# Patient Record
Sex: Female | Born: 1935
Health system: Southern US, Community
[De-identification: ages and names within clinical notes are randomized; demographics above are authoritative.]

## PROBLEM LIST (undated history)

## (undated) DIAGNOSIS — E878 Other disorders of electrolyte and fluid balance, not elsewhere classified: Secondary | ICD-10-CM

## (undated) DIAGNOSIS — E669 Obesity, unspecified: Secondary | ICD-10-CM

## (undated) DIAGNOSIS — K219 Gastro-esophageal reflux disease without esophagitis: Secondary | ICD-10-CM

## (undated) DIAGNOSIS — D329 Benign neoplasm of meninges, unspecified: Secondary | ICD-10-CM

## (undated) DIAGNOSIS — K900 Celiac disease: Secondary | ICD-10-CM

## (undated) DIAGNOSIS — I6529 Occlusion and stenosis of unspecified carotid artery: Secondary | ICD-10-CM

## (undated) DIAGNOSIS — R269 Unspecified abnormalities of gait and mobility: Secondary | ICD-10-CM

## (undated) DIAGNOSIS — E785 Hyperlipidemia, unspecified: Secondary | ICD-10-CM

## (undated) DIAGNOSIS — I10 Essential (primary) hypertension: Secondary | ICD-10-CM

## (undated) DIAGNOSIS — I2510 Atherosclerotic heart disease of native coronary artery without angina pectoris: Secondary | ICD-10-CM

## (undated) DIAGNOSIS — K5792 Diverticulitis of intestine, part unspecified, without perforation or abscess without bleeding: Secondary | ICD-10-CM

## (undated) DIAGNOSIS — L719 Rosacea, unspecified: Secondary | ICD-10-CM

## (undated) HISTORY — PX: CARDIAC CATHETERIZATION: SHX172

## (undated) HISTORY — DX: Hyperlipidemia, unspecified: E78.5

## (undated) HISTORY — DX: Celiac disease: K90.0

## (undated) HISTORY — DX: Diverticulitis of intestine, part unspecified, without perforation or abscess without bleeding: K57.92

## (undated) HISTORY — DX: Occlusion and stenosis of unspecified carotid artery: I65.29

## (undated) HISTORY — PX: REPLACEMENT TOTAL KNEE: SUR1224

## (undated) HISTORY — PX: ABDOMINAL HYSTERECTOMY: SHX81

## (undated) HISTORY — PX: BUNIONECTOMY: SHX129

## (undated) HISTORY — DX: Benign neoplasm of meninges, unspecified: D32.9

## (undated) HISTORY — DX: Unspecified abnormalities of gait and mobility: R26.9

## (undated) HISTORY — DX: Obesity, unspecified: E66.9

## (undated) HISTORY — PX: TONSILLECTOMY: SUR1361

## (undated) HISTORY — DX: Gastro-esophageal reflux disease without esophagitis: K21.9

---

## 1991-06-19 HISTORY — PX: BRAIN TUMOR EXCISION: SHX577

## 1998-03-22 ENCOUNTER — Other Ambulatory Visit: Admission: RE | Admit: 1998-03-22 | Discharge: 1998-03-22 | Payer: Self-pay | Admitting: Obstetrics and Gynecology

## 1998-07-06 ENCOUNTER — Ambulatory Visit (HOSPITAL_COMMUNITY): Admission: RE | Admit: 1998-07-06 | Discharge: 1998-07-06 | Payer: Self-pay | Admitting: Surgery

## 1998-07-06 ENCOUNTER — Encounter: Payer: Self-pay | Admitting: Surgery

## 1999-02-07 ENCOUNTER — Encounter: Payer: Self-pay | Admitting: *Deleted

## 1999-02-07 ENCOUNTER — Ambulatory Visit (HOSPITAL_COMMUNITY): Admission: RE | Admit: 1999-02-07 | Discharge: 1999-02-07 | Payer: Self-pay | Admitting: *Deleted

## 1999-07-17 ENCOUNTER — Encounter (INDEPENDENT_AMBULATORY_CARE_PROVIDER_SITE_OTHER): Payer: Self-pay | Admitting: *Deleted

## 1999-07-17 ENCOUNTER — Inpatient Hospital Stay (HOSPITAL_COMMUNITY): Admission: EM | Admit: 1999-07-17 | Discharge: 1999-07-25 | Payer: Self-pay | Admitting: Emergency Medicine

## 1999-07-17 ENCOUNTER — Encounter: Payer: Self-pay | Admitting: Internal Medicine

## 1999-07-27 ENCOUNTER — Ambulatory Visit (HOSPITAL_COMMUNITY): Admission: RE | Admit: 1999-07-27 | Discharge: 1999-07-27 | Payer: Self-pay | Admitting: Surgery

## 1999-07-27 ENCOUNTER — Encounter: Payer: Self-pay | Admitting: Surgery

## 1999-08-08 ENCOUNTER — Ambulatory Visit (HOSPITAL_COMMUNITY): Admission: RE | Admit: 1999-08-08 | Discharge: 1999-08-08 | Payer: Self-pay | Admitting: Surgery

## 1999-08-08 ENCOUNTER — Encounter: Payer: Self-pay | Admitting: Surgery

## 1999-08-22 ENCOUNTER — Encounter: Payer: Self-pay | Admitting: Surgery

## 1999-08-22 ENCOUNTER — Encounter (INDEPENDENT_AMBULATORY_CARE_PROVIDER_SITE_OTHER): Payer: Self-pay

## 1999-08-22 ENCOUNTER — Ambulatory Visit (HOSPITAL_COMMUNITY): Admission: RE | Admit: 1999-08-22 | Discharge: 1999-08-22 | Payer: Self-pay | Admitting: Surgery

## 1999-09-14 ENCOUNTER — Ambulatory Visit (HOSPITAL_COMMUNITY): Admission: RE | Admit: 1999-09-14 | Discharge: 1999-09-14 | Payer: Self-pay | Admitting: *Deleted

## 2000-02-27 ENCOUNTER — Encounter: Payer: Self-pay | Admitting: Surgery

## 2000-02-27 ENCOUNTER — Encounter: Admission: RE | Admit: 2000-02-27 | Discharge: 2000-02-27 | Payer: Self-pay | Admitting: Surgery

## 2000-06-18 HISTORY — PX: CORONARY ARTERY BYPASS GRAFT: SHX141

## 2000-07-30 ENCOUNTER — Ambulatory Visit (HOSPITAL_COMMUNITY): Admission: RE | Admit: 2000-07-30 | Discharge: 2000-07-30 | Payer: Self-pay | Admitting: *Deleted

## 2000-07-30 ENCOUNTER — Encounter: Payer: Self-pay | Admitting: *Deleted

## 2000-07-31 ENCOUNTER — Encounter: Payer: Self-pay | Admitting: *Deleted

## 2000-07-31 ENCOUNTER — Encounter: Admission: RE | Admit: 2000-07-31 | Discharge: 2000-07-31 | Payer: Self-pay | Admitting: *Deleted

## 2000-11-07 ENCOUNTER — Inpatient Hospital Stay (HOSPITAL_COMMUNITY): Admission: EM | Admit: 2000-11-07 | Discharge: 2000-11-18 | Payer: Self-pay

## 2000-11-07 ENCOUNTER — Encounter: Payer: Self-pay | Admitting: Family Medicine

## 2000-11-10 ENCOUNTER — Encounter: Payer: Self-pay | Admitting: Surgery

## 2000-11-11 ENCOUNTER — Encounter: Payer: Self-pay | Admitting: Surgery

## 2000-11-12 ENCOUNTER — Encounter: Payer: Self-pay | Admitting: Surgery

## 2000-11-13 ENCOUNTER — Encounter: Payer: Self-pay | Admitting: Surgery

## 2000-11-18 ENCOUNTER — Encounter: Payer: Self-pay | Admitting: Cardiothoracic Surgery

## 2000-12-17 ENCOUNTER — Encounter (HOSPITAL_COMMUNITY): Admission: RE | Admit: 2000-12-17 | Discharge: 2001-03-17 | Payer: Self-pay | Admitting: Cardiology

## 2001-01-29 ENCOUNTER — Encounter: Payer: Self-pay | Admitting: Gastroenterology

## 2001-01-29 ENCOUNTER — Ambulatory Visit (HOSPITAL_COMMUNITY): Admission: RE | Admit: 2001-01-29 | Discharge: 2001-01-29 | Payer: Self-pay | Admitting: Gastroenterology

## 2001-02-25 ENCOUNTER — Ambulatory Visit (HOSPITAL_COMMUNITY): Admission: RE | Admit: 2001-02-25 | Discharge: 2001-02-25 | Payer: Self-pay | Admitting: Gastroenterology

## 2001-02-25 ENCOUNTER — Encounter (INDEPENDENT_AMBULATORY_CARE_PROVIDER_SITE_OTHER): Payer: Self-pay | Admitting: *Deleted

## 2001-04-07 ENCOUNTER — Encounter: Admission: RE | Admit: 2001-04-07 | Discharge: 2001-07-06 | Payer: Self-pay | Admitting: Gastroenterology

## 2001-05-23 ENCOUNTER — Ambulatory Visit (HOSPITAL_COMMUNITY): Admission: RE | Admit: 2001-05-23 | Discharge: 2001-05-23 | Payer: Self-pay | Admitting: Gastroenterology

## 2001-08-01 ENCOUNTER — Encounter: Payer: Self-pay | Admitting: *Deleted

## 2001-08-01 ENCOUNTER — Ambulatory Visit (HOSPITAL_COMMUNITY): Admission: RE | Admit: 2001-08-01 | Discharge: 2001-08-01 | Payer: Self-pay | Admitting: *Deleted

## 2001-10-08 ENCOUNTER — Encounter: Admission: RE | Admit: 2001-10-08 | Discharge: 2002-01-06 | Payer: Self-pay | Admitting: *Deleted

## 2002-01-19 ENCOUNTER — Encounter: Admission: RE | Admit: 2002-01-19 | Discharge: 2002-01-19 | Payer: Self-pay | Admitting: *Deleted

## 2002-01-19 ENCOUNTER — Encounter: Payer: Self-pay | Admitting: *Deleted

## 2002-08-04 ENCOUNTER — Encounter: Admission: RE | Admit: 2002-08-04 | Discharge: 2002-08-04 | Payer: Self-pay | Admitting: *Deleted

## 2002-08-04 ENCOUNTER — Encounter: Payer: Self-pay | Admitting: *Deleted

## 2002-09-03 ENCOUNTER — Encounter: Admission: RE | Admit: 2002-09-03 | Discharge: 2002-09-15 | Payer: Self-pay | Admitting: *Deleted

## 2003-08-10 ENCOUNTER — Encounter: Admission: RE | Admit: 2003-08-10 | Discharge: 2003-08-10 | Payer: Self-pay | Admitting: *Deleted

## 2003-08-13 ENCOUNTER — Emergency Department (HOSPITAL_COMMUNITY): Admission: EM | Admit: 2003-08-13 | Discharge: 2003-08-13 | Payer: Self-pay | Admitting: Emergency Medicine

## 2003-09-08 ENCOUNTER — Encounter: Admission: RE | Admit: 2003-09-08 | Discharge: 2003-10-06 | Payer: Self-pay | Admitting: Family Medicine

## 2003-10-06 ENCOUNTER — Encounter: Admission: RE | Admit: 2003-10-06 | Discharge: 2004-01-04 | Payer: Self-pay | Admitting: *Deleted

## 2003-12-07 ENCOUNTER — Ambulatory Visit (HOSPITAL_COMMUNITY): Admission: RE | Admit: 2003-12-07 | Discharge: 2003-12-07 | Payer: Self-pay | Admitting: *Deleted

## 2003-12-28 ENCOUNTER — Encounter: Admission: RE | Admit: 2003-12-28 | Discharge: 2004-01-25 | Payer: Self-pay | Admitting: *Deleted

## 2004-03-24 ENCOUNTER — Encounter: Admission: RE | Admit: 2004-03-24 | Discharge: 2004-03-24 | Payer: Self-pay | Admitting: Orthopedic Surgery

## 2004-08-10 ENCOUNTER — Encounter: Admission: RE | Admit: 2004-08-10 | Discharge: 2004-08-10 | Payer: Self-pay | Admitting: *Deleted

## 2005-01-18 ENCOUNTER — Encounter: Admission: RE | Admit: 2005-01-18 | Discharge: 2005-02-06 | Payer: Self-pay | Admitting: *Deleted

## 2005-02-20 ENCOUNTER — Encounter: Admission: RE | Admit: 2005-02-20 | Discharge: 2005-03-22 | Payer: Self-pay | Admitting: *Deleted

## 2005-08-14 ENCOUNTER — Encounter: Admission: RE | Admit: 2005-08-14 | Discharge: 2005-08-14 | Payer: Self-pay | Admitting: *Deleted

## 2006-07-21 ENCOUNTER — Inpatient Hospital Stay (HOSPITAL_COMMUNITY): Admission: EM | Admit: 2006-07-21 | Discharge: 2006-07-23 | Payer: Self-pay | Admitting: *Deleted

## 2006-07-31 ENCOUNTER — Ambulatory Visit (HOSPITAL_COMMUNITY): Admission: RE | Admit: 2006-07-31 | Discharge: 2006-07-31 | Payer: Self-pay | Admitting: Cardiology

## 2006-08-15 ENCOUNTER — Encounter: Admission: RE | Admit: 2006-08-15 | Discharge: 2006-08-15 | Payer: Self-pay | Admitting: *Deleted

## 2007-06-19 HISTORY — PX: JOINT REPLACEMENT: SHX530

## 2007-08-28 ENCOUNTER — Encounter: Admission: RE | Admit: 2007-08-28 | Discharge: 2007-08-28 | Payer: Self-pay | Admitting: Family Medicine

## 2007-12-30 ENCOUNTER — Encounter: Admission: RE | Admit: 2007-12-30 | Discharge: 2007-12-30 | Payer: Self-pay | Admitting: Family Medicine

## 2008-03-30 ENCOUNTER — Inpatient Hospital Stay (HOSPITAL_COMMUNITY): Admission: RE | Admit: 2008-03-30 | Discharge: 2008-04-02 | Payer: Self-pay | Admitting: Orthopedic Surgery

## 2008-03-31 ENCOUNTER — Ambulatory Visit: Payer: Self-pay | Admitting: Physical Medicine & Rehabilitation

## 2008-05-18 ENCOUNTER — Emergency Department (HOSPITAL_COMMUNITY): Admission: EM | Admit: 2008-05-18 | Discharge: 2008-05-18 | Payer: Self-pay | Admitting: Emergency Medicine

## 2008-05-20 ENCOUNTER — Ambulatory Visit: Payer: Self-pay | Admitting: Surgery

## 2008-05-20 ENCOUNTER — Encounter: Payer: Self-pay | Admitting: *Deleted

## 2008-05-20 ENCOUNTER — Observation Stay (HOSPITAL_COMMUNITY): Admission: EM | Admit: 2008-05-20 | Discharge: 2008-05-22 | Payer: Self-pay | Admitting: Emergency Medicine

## 2008-05-20 ENCOUNTER — Encounter (INDEPENDENT_AMBULATORY_CARE_PROVIDER_SITE_OTHER): Payer: Self-pay | Admitting: Internal Medicine

## 2008-06-15 ENCOUNTER — Encounter: Admission: RE | Admit: 2008-06-15 | Discharge: 2008-08-16 | Payer: Self-pay | Admitting: Family Medicine

## 2008-06-18 HISTORY — PX: ROTATOR CUFF REPAIR: SHX139

## 2008-07-20 ENCOUNTER — Inpatient Hospital Stay (HOSPITAL_COMMUNITY): Admission: EM | Admit: 2008-07-20 | Discharge: 2008-07-24 | Payer: Self-pay | Admitting: Emergency Medicine

## 2008-08-30 ENCOUNTER — Encounter: Admission: RE | Admit: 2008-08-30 | Discharge: 2008-08-30 | Payer: Self-pay | Admitting: Family Medicine

## 2008-09-06 ENCOUNTER — Encounter: Admission: RE | Admit: 2008-09-06 | Discharge: 2008-09-06 | Payer: Self-pay | Admitting: Family Medicine

## 2008-09-09 ENCOUNTER — Encounter: Admission: RE | Admit: 2008-09-09 | Discharge: 2008-09-09 | Payer: Self-pay | Admitting: Family Medicine

## 2008-11-25 ENCOUNTER — Inpatient Hospital Stay (HOSPITAL_COMMUNITY): Admission: RE | Admit: 2008-11-25 | Discharge: 2008-11-26 | Payer: Self-pay | Admitting: Orthopedic Surgery

## 2008-12-06 ENCOUNTER — Encounter: Admission: RE | Admit: 2008-12-06 | Discharge: 2008-12-06 | Payer: Self-pay | Admitting: Family Medicine
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
## 2009-04-14 ENCOUNTER — Encounter: Admission: RE | Admit: 2009-04-14 | Discharge: 2009-04-14 | Payer: Self-pay | Admitting: Family Medicine

## 2009-08-31 ENCOUNTER — Encounter: Admission: RE | Admit: 2009-08-31 | Discharge: 2009-08-31 | Payer: Self-pay | Admitting: Family Medicine

## 2010-03-15 ENCOUNTER — Encounter: Admission: RE | Admit: 2010-03-15 | Discharge: 2010-03-15 | Payer: Self-pay | Admitting: Family Medicine

## 2010-05-25 ENCOUNTER — Encounter
Admission: RE | Admit: 2010-05-25 | Discharge: 2010-05-25 | Payer: Self-pay | Source: Home / Self Care | Admitting: Neurology

## 2010-07-06 ENCOUNTER — Encounter
Admission: RE | Admit: 2010-07-06 | Discharge: 2010-07-18 | Payer: Self-pay | Source: Home / Self Care | Attending: Neurology | Admitting: Neurology

## 2010-07-08 ENCOUNTER — Emergency Department (HOSPITAL_COMMUNITY)
Admission: EM | Admit: 2010-07-08 | Discharge: 2010-07-08 | Payer: Self-pay | Source: Home / Self Care | Admitting: Emergency Medicine

## 2010-07-11 LAB — POCT I-STAT, CHEM 8
BUN: 14 mg/dL (ref 6–23)
Creatinine, Ser: 1.1 mg/dL (ref 0.4–1.2)
Hemoglobin: 15 g/dL (ref 12.0–15.0)
Potassium: 4.2 mEq/L (ref 3.5–5.1)
Sodium: 140 mEq/L (ref 135–145)
TCO2: 25 mmol/L (ref 0–100)

## 2010-07-11 LAB — URINALYSIS, ROUTINE W REFLEX MICROSCOPIC
Ketones, ur: NEGATIVE mg/dL
Nitrite: NEGATIVE
Protein, ur: NEGATIVE mg/dL

## 2010-07-11 LAB — URINE MICROSCOPIC-ADD ON

## 2010-07-11 LAB — GLUCOSE, CAPILLARY: Glucose-Capillary: 114 mg/dL — ABNORMAL HIGH (ref 70–99)

## 2010-07-11 LAB — POCT CARDIAC MARKERS
CKMB, poc: <1 ng/mL — ABNORMAL LOW (ref 1.0–8.0)
Troponin i, poc: 0.05 ng/mL (ref 0.00–0.09)

## 2010-07-19 ENCOUNTER — Ambulatory Visit: Payer: Medicare Other | Attending: Neurology | Admitting: Physical Therapy

## 2010-07-19 DIAGNOSIS — IMO0001 Reserved for inherently not codable concepts without codable children: Secondary | ICD-10-CM | POA: Insufficient documentation

## 2010-07-19 DIAGNOSIS — M6281 Muscle weakness (generalized): Secondary | ICD-10-CM | POA: Insufficient documentation

## 2010-07-19 DIAGNOSIS — R269 Unspecified abnormalities of gait and mobility: Secondary | ICD-10-CM | POA: Insufficient documentation

## 2010-07-19 DIAGNOSIS — R293 Abnormal posture: Secondary | ICD-10-CM | POA: Insufficient documentation

## 2010-07-25 ENCOUNTER — Ambulatory Visit: Payer: Medicare Other | Admitting: Physical Therapy

## 2010-07-27 ENCOUNTER — Other Ambulatory Visit: Payer: Self-pay | Admitting: Family Medicine

## 2010-07-27 DIAGNOSIS — Z1239 Encounter for other screening for malignant neoplasm of breast: Secondary | ICD-10-CM

## 2010-07-28 ENCOUNTER — Ambulatory Visit: Payer: Medicare Other | Admitting: Physical Therapy

## 2010-07-28 ENCOUNTER — Encounter: Payer: Self-pay | Admitting: Physical Therapy

## 2010-08-01 ENCOUNTER — Ambulatory Visit: Payer: Medicare Other | Admitting: Physical Therapy

## 2010-08-03 ENCOUNTER — Ambulatory Visit: Payer: Medicare Other | Admitting: Physical Therapy

## 2010-08-07 ENCOUNTER — Encounter: Payer: Self-pay | Admitting: Physical Therapy

## 2010-08-11 ENCOUNTER — Ambulatory Visit: Payer: Medicare Other | Admitting: Physical Therapy

## 2010-08-14 ENCOUNTER — Ambulatory Visit: Payer: Medicare Other | Admitting: Physical Therapy

## 2010-08-17 ENCOUNTER — Ambulatory Visit: Payer: Medicare Other | Attending: Neurology | Admitting: Physical Therapy

## 2010-08-17 DIAGNOSIS — R269 Unspecified abnormalities of gait and mobility: Secondary | ICD-10-CM | POA: Insufficient documentation

## 2010-08-17 DIAGNOSIS — R293 Abnormal posture: Secondary | ICD-10-CM | POA: Insufficient documentation

## 2010-08-17 DIAGNOSIS — IMO0001 Reserved for inherently not codable concepts without codable children: Secondary | ICD-10-CM | POA: Insufficient documentation

## 2010-08-17 DIAGNOSIS — M6281 Muscle weakness (generalized): Secondary | ICD-10-CM | POA: Insufficient documentation

## 2010-08-18 ENCOUNTER — Ambulatory Visit: Payer: Medicare Other | Admitting: Physical Therapy

## 2010-08-21 ENCOUNTER — Ambulatory Visit: Payer: Medicare Other | Admitting: Physical Therapy

## 2010-08-23 ENCOUNTER — Ambulatory Visit: Payer: Medicare Other | Admitting: Physical Therapy

## 2010-08-28 ENCOUNTER — Ambulatory Visit: Payer: Medicare Other | Admitting: Physical Therapy

## 2010-08-30 ENCOUNTER — Ambulatory Visit: Payer: Medicare Other | Admitting: Physical Therapy

## 2010-09-04 ENCOUNTER — Ambulatory Visit: Payer: Medicare Other | Admitting: Physical Therapy

## 2010-09-05 ENCOUNTER — Ambulatory Visit
Admission: RE | Admit: 2010-09-05 | Discharge: 2010-09-05 | Disposition: A | Discharge status: Home / Self Care | Payer: Medicare Other | Source: Ambulatory Visit | Attending: Family Medicine | Admitting: Family Medicine

## 2010-09-05 DIAGNOSIS — Z1239 Encounter for other screening for malignant neoplasm of breast: Secondary | ICD-10-CM

## 2010-09-06 ENCOUNTER — Ambulatory Visit: Payer: Medicare Other | Admitting: Physical Therapy

## 2010-09-25 LAB — COMPREHENSIVE METABOLIC PANEL
ALT: 16 U/L (ref 0–35)
Alkaline Phosphatase: 115 U/L (ref 39–117)
CO2: 28 mEq/L (ref 19–32)
Chloride: 105 mEq/L (ref 96–112)
GFR calc non Af Amer: >60 mL/min (ref >60)
Glucose, Bld: 105 mg/dL — ABNORMAL HIGH (ref 70–99)
Potassium: 4 mEq/L (ref 3.5–5.1)
Sodium: 143 mEq/L (ref 135–145)
Total Bilirubin: 1.6 mg/dL — ABNORMAL HIGH (ref 0.3–1.2)
Total Protein: 8.6 g/dL — ABNORMAL HIGH (ref 6.0–8.3)

## 2010-09-25 LAB — GLUCOSE, CAPILLARY
Glucose-Capillary: 128 mg/dL — ABNORMAL HIGH (ref 70–99)
Glucose-Capillary: 132 mg/dL — ABNORMAL HIGH (ref 70–99)
Glucose-Capillary: 91 mg/dL (ref 70–99)
Glucose-Capillary: 95 mg/dL (ref 70–99)

## 2010-09-25 LAB — CBC
HCT: 42.3 % (ref 36.0–46.0)
Hemoglobin: 14.5 g/dL (ref 12.0–15.0)
RBC: 5.17 MIL/uL — ABNORMAL HIGH (ref 3.87–5.11)
WBC: 7.6 10*3/uL (ref 4.0–10.5)

## 2010-09-25 LAB — URINALYSIS, ROUTINE W REFLEX MICROSCOPIC
Bilirubin Urine: NEGATIVE
Hgb urine dipstick: NEGATIVE
Specific Gravity, Urine: 1.014 (ref 1.005–1.030)
Urobilinogen, UA: 0.2 mg/dL (ref 0.0–1.0)

## 2010-09-25 LAB — PROTIME-INR: INR: 1 (ref 0.00–1.49)

## 2010-09-25 LAB — DIFFERENTIAL
Basophils Absolute: 0 10*3/uL (ref 0.0–0.1)
Basophils Relative: 1 % (ref 0–1)
Eosinophils Absolute: 0.2 10*3/uL (ref 0.0–0.7)
Monocytes Relative: 7 % (ref 3–12)
Neutrophils Relative %: 60 % (ref 43–77)

## 2010-09-25 LAB — URINE MICROSCOPIC-ADD ON

## 2010-10-03 LAB — CBC
HCT: 33.9 % — ABNORMAL LOW (ref 36.0–46.0)
Hemoglobin: 11.6 g/dL — ABNORMAL LOW (ref 12.0–15.0)
Hemoglobin: 12.5 g/dL (ref 12.0–15.0)
MCHC: 33.4 g/dL (ref 30.0–36.0)
MCV: 81.4 fL (ref 78.0–100.0)
MCV: 81.9 fL (ref 78.0–100.0)
Platelets: 186 10*3/uL (ref 150–400)
Platelets: 193 10*3/uL (ref 150–400)
RBC: 4.44 MIL/uL (ref 3.87–5.11)
RBC: 4.53 MIL/uL (ref 3.87–5.11)
RDW: 16.6 % — ABNORMAL HIGH (ref 11.5–15.5)
WBC: 7.3 10*3/uL (ref 4.0–10.5)

## 2010-10-03 LAB — PROTIME-INR: Prothrombin Time: 15.4 seconds — ABNORMAL HIGH (ref 11.6–15.2)

## 2010-10-03 LAB — COMPREHENSIVE METABOLIC PANEL
ALT: 10 U/L (ref 0–35)
ALT: 11 U/L (ref 0–35)
AST: 16 U/L (ref 0–37)
AST: 20 U/L (ref 0–37)
Albumin: 3.2 g/dL — ABNORMAL LOW (ref 3.5–5.2)
Albumin: 3.9 g/dL (ref 3.5–5.2)
BUN: 11 mg/dL (ref 6–23)
CO2: 25 mEq/L (ref 19–32)
CO2: 26 mEq/L (ref 19–32)
Calcium: 8.7 mg/dL (ref 8.4–10.5)
Chloride: 101 mEq/L (ref 96–112)
Chloride: 103 mEq/L (ref 96–112)
Creatinine, Ser: 0.82 mg/dL (ref 0.4–1.2)
Creatinine, Ser: 0.84 mg/dL (ref 0.4–1.2)
GFR calc Af Amer: >60 mL/min (ref >60)
GFR calc Af Amer: >60 mL/min (ref >60)
GFR calc non Af Amer: >60 mL/min (ref >60)
Glucose, Bld: 97 mg/dL (ref 70–99)
Sodium: 133 mEq/L — ABNORMAL LOW (ref 135–145)
Sodium: 136 mEq/L (ref 135–145)
Total Bilirubin: 0.8 mg/dL (ref 0.3–1.2)
Total Bilirubin: 2 mg/dL — ABNORMAL HIGH (ref 0.3–1.2)
Total Protein: 6.1 g/dL (ref 6.0–8.3)
Total Protein: 7.2 g/dL (ref 6.0–8.3)

## 2010-10-03 LAB — URINE CULTURE: Culture: NO GROWTH

## 2010-10-03 LAB — CULTURE, BLOOD (ROUTINE X 2): Culture: NO GROWTH

## 2010-10-03 LAB — GLUCOSE, CAPILLARY
Glucose-Capillary: 100 mg/dL — ABNORMAL HIGH (ref 70–99)
Glucose-Capillary: 80 mg/dL (ref 70–99)
Glucose-Capillary: 87 mg/dL (ref 70–99)
Glucose-Capillary: 91 mg/dL (ref 70–99)
Glucose-Capillary: 91 mg/dL (ref 70–99)
Glucose-Capillary: 93 mg/dL (ref 70–99)
Glucose-Capillary: 96 mg/dL (ref 70–99)

## 2010-10-03 LAB — CLOSTRIDIUM DIFFICILE EIA

## 2010-10-03 LAB — DIFFERENTIAL
Basophils Relative: 0 % (ref 0–1)
Eosinophils Relative: 0 % (ref 0–5)
Lymphs Abs: 1.7 10*3/uL (ref 0.7–4.0)
Monocytes Relative: 7 % (ref 3–12)
Neutro Abs: 9.4 10*3/uL — ABNORMAL HIGH (ref 1.7–7.7)

## 2010-10-03 LAB — URINE MICROSCOPIC-ADD ON

## 2010-10-03 LAB — HEMOGLOBIN A1C
Hgb A1c MFr Bld: 5.6 % (ref 4.6–6.1)
Mean Plasma Glucose: 114 mg/dL

## 2010-10-03 LAB — URINALYSIS, ROUTINE W REFLEX MICROSCOPIC
Glucose, UA: NEGATIVE mg/dL
Hgb urine dipstick: NEGATIVE
Specific Gravity, Urine: 1.01 (ref 1.005–1.030)
Urobilinogen, UA: 0.2 mg/dL (ref 0.0–1.0)

## 2010-10-10 DIAGNOSIS — R269 Unspecified abnormalities of gait and mobility: Secondary | ICD-10-CM | POA: Insufficient documentation

## 2010-10-10 DIAGNOSIS — I679 Cerebrovascular disease, unspecified: Secondary | ICD-10-CM | POA: Insufficient documentation

## 2010-10-10 DIAGNOSIS — R42 Dizziness and giddiness: Secondary | ICD-10-CM | POA: Insufficient documentation
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
## 2010-10-31 NOTE — Discharge Summary (Signed)
[name], [name]                   ACCOUNT NO.:  [id number]   MEDICAL RECORD NO.:  [id number]          PATIENT TYPE:  INP   LOCATION:  1606                         FACILITY:  [facility]   PHYSICIAN:  Georges Lynch. Gioffre, M.D.DATE OF BIRTH:  June 25, 1928   DATE OF ADMISSION:  03/30/2008  DATE OF DISCHARGE:  04/02/2008                               DISCHARGE SUMMARY   [name] is going to be discharged to a skilled nursing facility,  hopefully today, April 02, 2008.  I did a total knee arthroplasty on  the right on March 30, 2008.  Her postoperative course was uneventful.  The dressing was changed the following day and her Hemovac was  discontinued.  She did well.  Her hemoglobin remained stable.  She was  up ambulating without any complication.  The circulation in her foot  remained intact.   On April 02, 2008 her hemoglobin was 9.3 and hematocrit was 27.  Her  INR was 1.5.  Blood sugar was 108.  Sodium 133, potassium 3.6, chloride  98, BUN 8, creatinine 0.77.   VITAL SIGNS:  Temperature was 98.3.  She did well.  She was stable as  far as her vital signs.  On one occasion her blood pressure went up to  180/74; usually averaged about 150-155/70.   OTHER PERTINENT STUDIES:  She came into the hospital and had a chest x-  ray evaluation and EKG.  The chest x-ray showed stable chest x-ray with  no specific problems. The EKG was normal sinus rhythm.   Finally, on April 02, 2008 I elected to discharge her to the skilled  nursing facility.   DISCHARGE MEDICATIONS:  She was discharged on medications from me:  1. Coumadin 5 mg by mouth daily.  2. Dilaudid 2 mg every 4 hours p.r.n. for pain.  3. Robaxin 500 mg by mouth t.i.d. p.r.n. for spasm.  4. Ferrous sulfate 325 mg b.i.d.  5. She will remain on her insulin protocol she was on prior to coming      into the hospital.  6. Lasix 20 mg a day.  7. Benicar 40 mg a day.  8. Protonix 40 mg a day.  9. Actos 30 mg a day.  10.Metrogel  applied topically p.r.n.  11.Benadryl 25 mg q.8 h. p.r.n. for rash.   DISCHARGE INSTRUCTIONS:  1. She will ambulate full weightbearing with her knee immobilizer and      walker.  2. Range of motion exercises of her right knee with therapy.  3. She will see me in the office in 2 weeks from the day of surgery      for suture removal.  4. She will have her INR monitored weekly.  She will be on Coumadin      for 4 weeks from the day of surgery, and then aspirin 325 mg a day.  5. She can shower but not to sit in the tub.  6. She should have dressing changes daily.  7. She should have CBGs 3 times a day; no insulin coverage at night.  Her medical doctor at the skilled nursing facility should monitor      her diabetes.           ______________________________  Georges Lynch Darrelyn Hillock, M.D.     RAG/MEDQ  D:  04/02/2008  T:  04/02/2008  Job:  [id number]
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
## 2010-10-31 NOTE — H&P (Signed)
[name], [name]                   ACCOUNT NO.:  [id number]   MEDICAL RECORD NO.:  [id number]          PATIENT TYPE:  INP   LOCATION:  NA                           FACILITY:  [facility]   PHYSICIAN:  Georges Lynch. Gioffre, M.D.DATE OF BIRTH:  [birth year]   DATE OF ADMISSION:  03/30/2008  DATE OF DISCHARGE:                              HISTORY & PHYSICAL   CHIEF COMPLAINT:  Right knee pain.   HISTORY OF PRESENT ILLNESS:  The patient is a 77 year old female here  today for evaluation of her right knee.  She is going to have a right  total knee arthroplasty.  She has multiple issues with that knee.  She  has pain.  She has difficulty walking.  It gets stiff.  She has failed  conservative treatment.  She would like to proceed with a total knee  arthroplasty.  X-rays reveal that she has a large cystic lesion in the  medial femoral condyle with loss of medial joint space.   DRUG ALLERGIES:  DILANTIN, BACTRIM DS, ACCUTANE, CIPRO, PENICILLIN,  GLUTEN.   FOOD ALLERGIES:  GLUTEN.  She currently eats a gluten-free diet.   MEDICATIONS:  1. Benicar 40 mg a day.  2. Lasix 20 mg a day.  3. Protonix 40 mg daily.  4. Actos 30 mg a day.  5. TobraDex eye ointment 3.5 gram, 2 to 3 nights the first couple of      days of each month.  6. Protopic ointment 0.1% as needed on the nose and eyebrows.  7. Metronidazole 0.75% cream twice daily as needed.   PAST MEDICAL HISTORY:  1. Hypertension.  2. Coronary artery disease with bypass in 2002.  3. History of hiatal hernia.  4. History of reflux disease.  5. History of Celiac sprue disease.  6. Diabetes.  7. History of kidney stones.  8. Hard of hearing with bilateral hearing aids.  9. Occasional vertigo.  10.History of benign brain tumor in 1993.   REVIEW OF SYSTEMS:  Is negative for any GENERAL.  HEENT:  Bilateral  hearing aids and occasional problems with vertigo, for which she has  used some Antivert in the past.  DERMATOLOGIC:  She has issues with  rosacea.  RESPIRATORY:  Unremarkable.  CARDIOVASCULAR:  She has had a  normal stress test in February, 2008.  No recent shortness of breath or  chest pains.  GI:  Is unremarkable.  GENITOURINARY:  She has urination  at night.   PAST SURGICAL HISTORY:  Includes triple cardiac bypass in 2002. A benign  brain tumor removed, meningioma, in 1993.  Partial hysterectomy.  Crown  implants.  Bunion surgery.  Arthroscopy of the right knee.  The patient  denies any complications from any of these surgical procedures or  anesthesia.   FAMILY MEDICAL HISTORY:  Father is deceased at the age of 53 from heart  disease.  Mother deceased at the age of  31 from heart disease and  diabetes.  A brother deceased at the age of 37 from diabetes and heart  disease.  One sister deceased at 13 from  cancer of the small bowel.   SOCIAL HISTORY:  The patient is widowed.  She  has never smoked.  No  alcohol or drugs.  She has four grown children.  She lives alone.  She  will probably need a skilled nursing facility.  She has already talked  to [employer] and [facility].  She does have a living will and Power  of [address].   PHYSICAL EXAMINATION:  VITAL SIGNS:  Height is 5 feet 4 inches. Weight  is 170 pounds.  Blood pressure is 158/72.  Pulse is 74 and regular.  Respirations 12, nonlabored.  The patient is afebrile.  GENERAL:  This is a healthy-appearing female, conscious, alert and  appropriate who appears to be in no extreme distress.  HEENT:  Head was normocephalic.  Pupils equal, round, reactive.  Gross  hearing was intact with bilateral hearing aids.  NECK:  Supple.  No palpable lymphadenopathy.  Good range of motion.  CHEST:  Lung sounds clear and equal bilaterally.  She was slightly  hyperkyphotic.  HEART:  She has a well-healed midline surgical incision.  Heart has  regular rate and rhythm.  No murmurs, rubs or gallops.  ABDOMEN:  Soft, nontender, bowel sounds present.  EXTREMITIES: Upper extremities  had good range of motion of shoulders,  elbows and wrists.  Motor strength 5/5.  Lower extremities:  Both knees  were symmetrical in size and shape.  No signs of erythema or ecchymoses  both  knees.  She could fully extend then, flex them back to 120  degrees.  No instability.  She did have crepitus in her right knee.  Calves were soft and nontender.  Good motion at the ankles.  NEUROLOGICAL:  Patient is conscious, alert and appropriate and no gross  neurological defects noted.  PERIPHERAL VASCULAR:  Carotid pulses were 2+ with no bruits.  Radial  pulses were 2+.  Dorsalis pedis pulses were 1+.  She has a few scattered  varicosities but no pigmentation or significant edema.  BREAST:  Deferred at this time.  RECTAL:  Deferred at this time.  GENITOURINARY:  Deferred at this time.   PRIMARY CARE PHYSICIAN:  Chales Salmon. Abigail Miyamoto, M.D.  She also sees Amy Y.  [place], M.D. and Delon Sacramento, M.D.   CARDIOLOGIST:  Armanda Magic, M.D.   ENDOCRINOLOGIST:  Griffith Citron, M.D.   IMPRESSION:  1. Degenerative arthritis, right knee, with large medial femoral      condyle cyst.  2. Diabetes.  3. Hypertension.  4. Coronary artery disease with history bypass.  5. Celiac sprue disease.  6. Reflux disease with hiatal hernia.   PLAN:  Patient has been evaluated by Dr. Armanda Magic and has been  cleared for this upcoming surgical procedure.  The patient will undergo  all routine labs and tests prior to having a right total knee  arthroplasty by Dr. Darrelyn Hillock at [facility] on March 30, 2008.      Jamelle Rushing, P.A.    ______________________________  Georges Lynch Darrelyn Hillock, M.D.    RWK/MEDQ  D:  03/25/2008  T:  03/25/2008  Job:  [id number]

## 2010-10-31 NOTE — Op Note (Signed)
NAMEZADIA, UHDE                   ACCOUNT NO.:  [id number]   MEDICAL RECORD NO.:  [id number]          PATIENT TYPE:  INP   LOCATION:  0006                         FACILITY:  [facility]   PHYSICIAN:  Georges Lynch. Gioffre, M.D.DATE OF BIRTH:  12-01-1928   DATE OF PROCEDURE:  03/30/2008  DATE OF DISCHARGE:                               OPERATIVE REPORT   SURGEON:  Georges Lynch. Darrelyn Hillock, M.D.   ASSISTANT:  Madlyn Frankel. Charlann Boxer, M.D.   PREOPERATIVE DIAGNOSIS:  Severe degenerative arthritis right knee.   POSTOPERATIVE DIAGNOSIS:  Severe degenerative arthritis right knee.   OPERATION:  Right total knee arthroplasty utilizing DePuy system.  All  three components were cemented.  Vancomycin was used in the cement.  The  sizes used, the femur was a size 4 right posterior stabilized type.  The  tibial tray with a size 3 cemented.  The insert was a size 4 10 mm  thickness rotating platform.  The patella was a size 35.   PROCEDURE IN DETAIL:  Under spinal anesthesia routine orthopedic prep  and draping of the right knee was carried out.  At this time the patient  had 600 mg of clindamycin IV.  The leg was exsanguinated and Esmarch  tourniquet was elevated at 350 mmHg.  At this time the knee was flexed.  An incision was made over the anterior aspect of the right knee.  Bleeders identified and cauterized.  At that time two flaps were  created.  I then carried out a median parapatellar incision.  I  reflected the patella, flexed the knee and did medial and lateral  meniscectomies and excised the anterior posterior cruciate ligaments.  At this time initial drill holes made in the intercondylar notch.  The  #1 jig was inserted and 10 mm thickness was removed from the distal  femur.  Following that the femur next was measured to be a size 4 right  femur.  The third jig then was used to carry out our anterior-posterior  chamfer cuts for a size 4 femur.  Following that we prepared the tibia  in the usual fashion  utilizing intramedullary rod.  I removed 4 mm  thickness off the affected medial side of the tibia at this particular  time.  I then cut my keel cut out of the tibial plateau.  Once the tibia  was prepared the femur was prepared.  We went back and did our notch cut  out of the distal femur.  Following that the trial inserts were  inserted.  There was good fit, good stability and flexion/extension  utilizing a 10 mm thickness tibial insert.  Note, prior to doing the  trials I did go through and utilized the lamina spreaders to make sure  we had no posterior spurs.  A few spurs were removed at the posterior  femoral condyles.  I then utilized our spacer blocks to measure our  tension.  After we went through the trials we inserted the trial  prosthesis and I did a resurfacing procedure on the patella.  Three  drill holes were made  in the patella in a routine fashion for a size 35  patella.  Following that all the trial components were removed.  I  thoroughly water picked out the knee, dried the knee out and cemented  all three components in simultaneously.  Vancomycin was used in the  cement.  After this was completed I then removed all loose pieces of  cement and thoroughly water picked out the knee.  I then injected 30 mL  of 0.25% Marcaine with epinephrine and Toradol into the knee, 30 mg of  Toradol into the soft tissue structures.  At this time I then made sure  that all three components were in properly.  All the cement was removed.  I water picked out the knee again and then I finally inserted my  permanent rotating platform 10 mm  thickness size 4.  I then reduced the knee, inserted a Hemovac drain and  irrigated the knee out again and closed the knee in layers in the usual  fashion.  Skin was closed with metal staples.  Sterile Neosporin  dressing was applied.  The patient left the operating room in  satisfactory condition.           ______________________________  Georges Lynch  Darrelyn Hillock, M.D.     RAG/MEDQ  D:  03/30/2008  T:  03/30/2008  Job:  [id number]   cc:   Armanda Magic, M.D.  Fax: [phone]   Chales Salmon. Abigail Miyamoto, M.D.  Fax: [phone]
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
## 2010-10-31 NOTE — Discharge Summary (Signed)
[name], [name]                   ACCOUNT NO.:  [id number]   MEDICAL RECORD NO.:  [id number]          PATIENT TYPE:  INP   LOCATION:  1612                         FACILITY:  [facility]   PHYSICIAN:  Georges Lynch. Gioffre, M.D.DATE OF BIRTH:  [birth year]   DATE OF ADMISSION:  11/24/2008  DATE OF DISCHARGE:                               DISCHARGE SUMMARY   ADDENDUM TO DISCHARGE SUMMARY:  She was taken to surgery by me on November 24, 2008, at which time did a repair of the torn rotator cuff tendon and  an open acromionectomy and acromioplasty of her left shoulder.  Postop  day #1 on November 25, 2008, I was ready to discharge her, but she was very  dizzy.  She was acutely nauseated and in no condition to be discharged.  I did keep her overnight.  On June 11, I saw her again.  She was  afebrile, doing well.  She felt much better.  I did change the dressing,  and the wound looked good.  She needed to stay over, as I said before,  and I just added an addendum to my discharge summary that was done on  November 25, 2008.           ______________________________  Georges Lynch. Darrelyn Hillock, M.D.     RAG/MEDQ  D:  11/26/2008  T:  11/26/2008  Job:  [id number]

## 2010-10-31 NOTE — Discharge Summary (Signed)
[name], [name]                   ACCOUNT NO.:  [id number]   MEDICAL RECORD NO.:  [id number]          PATIENT TYPE:  OIB   LOCATION:  1612                         FACILITY:  [facility]   PHYSICIAN:  Georges Lynch. Gioffre, M.D.DATE OF BIRTH:  [birth year]   DATE OF ADMISSION:  11/24/2008  DATE OF DISCHARGE:                               DISCHARGE SUMMARY   She was taken to surgery on November 24, 2008.  Repaired a torn rotator cuff  tendon on the left.  I utilized a TissueMend graft.  She was placed in  the shoulder immobilizer postop.  The following day on November 25, 2008,  she is doing well.  She is moving her fingers well.  Circulation is  fine.  She has no particular complications postop.  I did go over the  postop orders with her and what to do at home in regards to the care of  her sling, and I will have the occupational therapist go over these, the  care of the sling and the shoulder with her.  There will be no  exercises.   LABORATORIES:  White count 7600, hemoglobin 14.5, hematocrit 42.3,  platelet count 266,000.  Differential was within normal limits.  Sodium  143, potassium 4, chloride 105, glucose 105, BUN 13, creatinine 0.81.  Calcium 9.6.  SGOT 25, SGPT 16. Alkaline phosphatase 115, bilirubin 1.6.  Urinalysis was negative.  She had an INR of 1.  Prothrombin time 13.7.  PTT is 27.  Chest x-ray showed no active cardiopulmonary problems.  EKG  was normal.   DISCHARGE MEDICINES:  1. She will remain on the same medications that she was on when she      came.  I only added to her regimen Percocet, take just 50 one every      4 hours p.r.n. for pain, Robaxin 500 mg t.i.d. for spasms.  2. She is on Zocor 10 mg a day.  3. Catapres 0.1 mg a day.  4. Norvasc 10 mg a day.  5. Benicar 40 mg a day.  6. Protonix 40 mg a day.  7. Lasix 20 mg a day.  8. Aspirin 81 mg a day.   POSTPROCEDURE INSTRUCTIONS:  Will be to see me in the office in 2 weeks  or prior to if there is a problem.   DISCHARGE  CONDITION:  Improved.   DISCHARGE DIET:  Remain on the same diet she was on at home prior to  coming in.  As I explained to her if she has any issues with any  particular problem just to call me.   Also would like to note that she was on clindamycin 600 mg 3 doses while  in the hospital.           ______________________________  Georges Lynch. Darrelyn Hillock, M.D.     RAG/MEDQ  D:  11/25/2008  T:  11/25/2008  Job:  [id number]

## 2010-10-31 NOTE — Op Note (Signed)
[name], [name]                   ACCOUNT NO.:  [id number]   MEDICAL RECORD NO.:  [id number]          PATIENT TYPE:  OIB   LOCATION:  1612                         FACILITY:  [facility]   PHYSICIAN:  Georges Lynch. Gioffre, M.D.DATE OF BIRTH:  [birth year]   DATE OF PROCEDURE:  11/24/2008  DATE OF DISCHARGE:                               OPERATIVE REPORT   SURGEON:  Georges Lynch. Darrelyn Hillock, M.D.   ASSISTANT:  Nurse.   PREOPERATIVE DIAGNOSES:  1. Severe impingement syndrome left shoulder.  2. Torn rotator cuff tendon left shoulder.   POSTOPERATIVE DIAGNOSES:  1. Severe impingement syndrome left shoulder.  2. Torn rotator cuff tendon left shoulder.   PROCEDURE:  1. Open acromionectomy and acromioplasty left shoulder.  2. Repair torn left rotator cuff tendon utilizing a TissueMend graft.      No anchors were used.   PROCEDURE IN DETAIL:  Under general anesthesia, routine orthopedic prep  and draping of the left shoulder was carried out.  She had 600 mg of  clindamycin IV.  At this time after sterile prep and drape was carried  out an incision was made over the anterior aspect of the left shoulder.  Bleeders were identified and cauterized.  Note, we did go through the  time-out routine in the usual fashion.  Self-retaining retractors were  inserted.  I detached the deltoid tendon by sharp dissection at the  acromion.  I split a small proximal part of the deltoid muscle.  I went  down and noted rather severe impingement of the acromion on the left.  I  protected the cuff with the Bennett retractor and utilized the  oscillating saw with the bur to do a partial acromionectomy and  acromioplasty.  Once we reestablished the subacromial space I inspected  the tendon.  She had a small hole in the rotator cuff tendon.  The  tendon was somewhat abraded as well.  Thoroughly irrigated out the area.  I did remove the subdeltoid bursa prior to exploring the tendon.  Following this we did do a repair of the  rotator cuff tendon in the  usual fashion.  I also utilized a TissueMend graft 3 x 3 TissueMend  graft.  No anchors were necessary.  We reinforced the tendon.  I  thoroughly irrigated out the area and reapproximated the deltoid tendon  and muscle in the usual fashion.  Subcu was closed with 0 Vicryl, skin  with metal staples.  A sterile Neosporin dressing was applied.  Note,  prior to taking her back to the operating room the anesthesiologist did  do an interscalene nerve block on the left.  She was placed in a sling  and a shoulder immobilizer.           ______________________________  Georges Lynch Darrelyn Hillock, M.D.     RAG/MEDQ  D:  11/24/2008  T:  11/24/2008  Job:  [id number]
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
## 2010-11-03 NOTE — Procedures (Signed)
[place]  Patient:    [name], [name] Visit Number: [id number] MRN: [id number]          Service Type: END Location: ENDO Attending Physician:  Orland Mustard Dictated by:   Llana Aliment. Randa Evens, M.D. Proc. Date: 02/25/01 Admit Date:  02/25/2001   CC:         Caryn Bee L. Little, M.D.   Procedure Report  PROCEDURE:  Esophagogastroduodenoscopy and biopsy.  MEDICATIONS:  Cetacaine spray, fentanyl 30 mcg, Versed 3 mg IV.  INDICATION:  Abdominal pain.  DESCRIPTION OF PROCEDURE:  The procedure had been explained to the patient and consent obtained.  With the patient in the left lateral decubitus position, the Olympus video endoscope was inserted blindly in the esophagus, advanced to the stomach.  A complete endoscopy was performed.  The stomach was completely normal other than a hiatal hernia with free reflux from the stomach up into the chest.  We passed down to the furthest extent of the scope in the duodenum and took several biopsies to look for celiac disease.  The scope was withdrawn back to the stomach and initial findings confirmed.  The scope was withdrawn. The patient tolerated the procedure well.  ASSESSMENT:  Gastroesophageal reflux disease.  PLAN:  Will check pathology results and keep on the same medicines and see back in the office in three to four weeks. Dictated by:   Llana Aliment. Randa Evens, M.D. Attending Physician:  Orland Mustard DD:  02/25/01 TD:  02/25/01 Job: [id number]

## 2010-11-03 NOTE — Discharge Summary (Signed)
[name], [name]                   ACCOUNT NO.:  [id number]   MEDICAL RECORD NO.:  [id number]          PATIENT TYPE:  INP   LOCATION:  1441                         FACILITY[facility]  [facility]   PHYSICIAN:  [name], MDDATE OF BIRTH:  Dec 18, 1928   DATE OF ADMISSION:  07/20/2008  DATE OF DISCHARGE:  07/24/2008                               DISCHARGE SUMMARY   DISCHARGE DIAGNOSES:  1. Diverticulitis with very small pericolonic abscess, not amendable      to drainage percutaneously.  2. Hypertension.  3. Coronary artery disease.  4. Type 2 diabetes.  5. Mild urinary tract infections.   DISCHARGE MEDICATIONS:  1. Aspirin 81 mg a day.  2. Stop iron.  3. Hold Actos for 7 days.  4. Augmentin 500 mg b.i.d. with food until gone.  5. Oxycodone 5 mg 1-2 tablets every 6 hours as needed for pain.  6. Tylenol 650 mg p.o. q.4 h. p.r.n. pain or fever.  7. Imodium as needed for diarrhea.  8. She may continue Benicar 40 mg a day.  9. Lasix 40 mg a day.  10.TobraDex ophthalmic as previous.  11.Multivitamin a day.  12.Clonidine 0.1 mg daily.  13.Amlodipine 10 mg a day.  14.Citrucel daily.   CONDITION:  Stable.   ACTIVITY:  Walk with assistance.   DIET:  Gluten free.   FOLLOW UP:  Follow up with Dr. Clarene Bryant next week.   CONSULTATIONS:  None.   PROCEDURE:  None.   LABORATORY DATA:  White count on admission was 11,900 with 79%  neutrophils, 14% lymphocytes.  CBC was unremarkable.  At discharge, her  white blood cell count is 7300.  Hemoccult of the stool was negative.  INR 1.2, PTT 44.  Basic metabolic panel essentially unremarkable. Liver  function tests significant for a total bilirubin of 2.1, otherwise  unremarkable.  Urinalysis showed trace ketones, negative protein,  negative nitrite, moderate leukocyte esterase, 7-10 white cells, 0-2 red  cells, blood culture is negative.  Urine culture negative.  C. diff of  the stool twice negative.   DIAGNOSTICS:  CT of the abdomen and pelvis  on admission showed sigmoid  diverticulitis.  No evidence of perforation or abscess, 5 mm ill-defined  nodule in the inferior lingula, does not appear significantly changed  from 2008.  Repeat CT of the abdomen and pelvis showed interval  development of a very small pericolonic abscess associated with changes  compatible with previously seen diverticulitis, likely not amendable to  percutaneous drainage.   HISTORY/HOSPITAL COURSE:  [name] is a 77 year old white female who  presented with 2 days' worth of diffuse abdominal pain.  She had no  fevers or chills.  She had no nausea or vomiting.  She had no diarrhea  on admission, but did develop some during the hospitalization which  improved.  She was afebrile and had essentially normal vital signs on  admission.  She had a diffusely tender abdomen.  No guarding, no  rebound.  CAT scan showed diverticulitis.  The patient was admitted,  given IV antibiotics, pain medication as needed and supportive care.  She was initially n.p.o. and subsequently started on clear liquids, and  advanced to a gluten-free diet.  By the time of discharge, she was  feeling much better, tolerating a diet.  Her leukocytosis had resolved  and she was requesting to go home.  She was tolerating oral pain  medication and antibiotic.  Repeat CAT scan showed tiny pericolonic  abscess and she will need a repeat CT scan in 1-2 weeks.  I have left a  message with Dr. Clarene Bryant.   Total time on the day of discharge is 40 minutes.      Carla L. Lendell Caprice, MD  Electronically Signed     CLS/MEDQ  D:  10/07/2008  T:  10/07/2008  Job:  [id number]

## 2010-11-03 NOTE — Consult Note (Signed)
[place]. [facility]  Patient:    ANNELLA Bryant                           MRN: [id number] Proc. Date: 07/18/99 Adm. Date:  [id number] Attending:  Anastasio Auerbach CC:         Anastasio Auerbach, M.D.                          Consultation Report  REASON FOR CONSULTATION:  Acute renal failure.  HISTORY OF PRESENT ILLNESS:  This is a 77 year old female with a history of meningioma in October 1992, history of breast biopsies x 2, history of hysterectomy, history of rosacea, nonspecific colitis, history of kidney stones and hypertension.  Last kidney stone was less than 5 years ago.  Two to three weeks ago she experienced a severe diarrheal illness and noticed a decrease in urine output associated with that.  She was treated after a week or so with Cipro and Flagyl. Urine volume stayed low.  She has had leg pain since her diarrhea started and it hurts profusely.  She has had no fevers or chills now.  No history of nonsteroidal use.  MEDICATIONS AT HOME:  1. Lopressor 100 mg a day.  2. Diovan 160/12.5 mg a day.  3. Premarin 0.625 mg a day.  4. Tylenol.  She has developed a rash in her calves and has been treated with Keflex.  She has been anorexic, nauseated, decreased energy, muscle cramping, itching, and very restless legs.  PAST MEDICAL HISTORY:  Listed above.  ALLERGIES:  She has an allergy to SULFA also and she gets a cough with an ACE INHIBITOR.  SOCIAL HISTORY:  She lives with her husband.  FAMILY HISTORY:  Her parents died at age 41, both of them about that time with heart disease.  She had one brother die of heart disease.  One sister died of complications of diabetes.  She has multiple other siblings with diabetes and hypertension.  She has four children who are all healthy.  She had hypertension  during her pregnancies.  REVIEW OF SYSTEMS:  HEENT:  Benign.  Cardiovascular:  She says she does not have much get up and go at the current time.  She  usually walks about a mile three times a week.  Noticed no ankle edema.  Sleeps on 1-2 pillows.  Nocturia x 1-2.  Now he notices some very little urine output.  GI:  Appetite is extremely poor.  She is still nauseated.  No diarrhea.  Musculoskeletal:  Her legs hurt.  Neurological: As above, but no other specific complaints.  OBJECTIVE PHYSICAL EXAMINATION:  GENERAL:  She is pale.  VITAL SIGNS:  Blood pressure 126/58 supine, going to 90/40 standing.  HEENT:  Benign.  CARDIOVASCULAR:  S4, regular rhythm.  Grade 2/6 systolic murmur heard best at the lower left sternal border.  PMI is ___________ at the fifth intercostal space. No edema.  Pulses are 2+/4+.  No bruits are noted.  LUNGS:  No rales, rhonchi, or wheezes.  ABDOMEN:  Diffuse mild tenderness.  Worse on the left than on the right.  SKIN:  She has erythema and papular rash with evidence of neurodermatitis on her shins anterior only.  LABORATORY:  Her hemoglobin is 12.8, white count 14,300, platelets 406,000.  Sodium 136, potassium 3.8, chloride 97, bicarbonate 19, creatinine 12.9, BUN 114, calcium 8.4, phosphorus 11.1.  CK  78.  Urinalysis is negative.  Creatinine was .91 a year ago.  Fractional excretion history 0.2.  Urine creatinine 42.  Urine sodium 48, creatinine clearance 5 cc.  ___________ ___________ equation.  ASSESSMENT:  1. Acute renal failure most likely acute tubular necrosis secondary to volume depletion in the setting of diarrhea and taking an ___________ which blocks her  ability to autoregulate.  This is supported by history, her bland urine sediment, and the findings of overall urine depletion.  She is less likely to have acute tubular interstitial disease related to Cipro r Flagyl or Keflex.  Most likely the Cipro of those three, also less likely to have acute glomerulonephritis related to Strep or vasculitis.  There is no evidence f thrombotic ___________ angiography or pulmonary renal  syndrome.  She is uremic and volume depleted.  We will need hemodialysis if she is not better in 24 hours.   2. Hypertension.   3. Meningioma.  PLAN:  1. I increased her IV fluids.  2. Serum urine ___________  3. Urine free eosinophils.  4. C3 and C4 complements.  5. FNA and an ASO titer. DD:  07/18/99 TD:  07/18/99 Job: [id number]

## 2010-11-03 NOTE — Op Note (Signed)
[place]. [facility]  Patient:    [name], [name]                          MRN: [id number] Proc. Date: 11/11/00 Adm. Date:  [id number] Attending:  Cleatrice Burke CC:         Darci Needle, M.D.  Armanda Magic, M.D.  Cath lab   Operative Report  PREOPERATIVE DIAGNOSIS:  Severe single vessel coronary artery disease with unstable angina.  POSTOPERATIVE DIAGNOSIS:  Severe single vessel coronary artery disease with unstable angina.  OPERATION PERFORMED:  Median sternotomy, extracorporeal circulation, coronary artery bypass graft surgery x 3 using a left internal mammary artery graft to the left anterior descending coronary artery, with a saphenous vein graft to the first diagonal branch of the left anterior descending, and a saphenous vein graft to the second diagonal branch of the left anterior descending.  SURGEON:  Alleen Borne, M.D.  ASSISTANT:  Dominica Severin, P.A.  ANESTHESIA:  General endotracheal.  INDICATIONS FOR PROCEDURE:  The patient is a 77 year old woman with a history of obesity, dyslipidemia, and hypertension as well as borderline diabetes who was admitted with abdominal discomfort that was atypical for angina.  She had no electrocardiogram or enzyme changes on admission. She then developed recurrent episodes of left chest pain.  She had a Cardiolite scan that showed anterior ischemia.  Cardiac catheterization showed 75 to 80% hazy mid-LAD stenosis after the second diagonal branch.  The first diagonal branch had 99% ostial and midstenosis.  The second diagonal had a 70% ostial stenosis.  The left circumflex and right coronary arteries had no significant disease.  Left ventricular ejection fraction was normal.  It was felt that the best treatment was to proceed with  coronary artery bypass surgery.  I discussed the operative procedure with the patient with her husband and family including alternatives to surgery, benefits, and risks  including bleeding, possible blood transfusion, infection, stroke, myocardial infarction, and death.   They understood and agreed to proceed with surgery.  DESCRIPTION OF PROCEDURE:  The patient was taken to the operating room and placed on the table in supine position.  After induction of general endotracheal anesthesia, a Foley catheter was placed in the bladder using sterile technique.  Then the chest, abdomen and both lower extremities were prepped and draped in the usual sterile manner.  The chest was entered through a median sternotomy incision and the pericardium opened in the midline. Examination of the heart showed good ventricular contractility.  The ascending aorta had no palpable plaques in it.  Then the left internal mammary artery was harvested from the chest Johann as a pedicle graft.  This was a medium caliber vessel with excellent blood flow through it.  At the same time a segment of greater saphenous vein was harvested from the left lower leg and this vein was of medium size and good quality.  Then the patient was heparinized and when an adequate activated clotting time was achieved, the distal ascending aorta was cannulated using a 20 French aortic cannula for arterial inflow.  Venous outflow was achieved using a two-stage venous cannula through the right atrial appendage.  An antegrade cardioplegia and vent cannula was inserted into the aortic root.  The patient was placed on cardiopulmonary bypass and the distal coronary arteries were identified.  The LAD was a large graftable vessel.  The first diagonal branch was a medium-sized graftable vessel.  The  second diagonal branch was slightly larger.  Then the aorta was cross-clamped and 500 cc of cold blood antegrade cardioplegia was administered in the aortic root with quick arrest of the heart.  Systemic hypothermia to 20 degrees centigrade and topical hypothermia with iced saline was used.  A temperature probe was  placed in the septum and an insulating pad in the pericardium.  The first distal anastomosis was performed to the first diagonal branch.  The internal diameter was 1.6 mm.  The conduit used was a segment of greater saphenous vein and anastomosis was performed in end-to-side manner using continuous 7-0 Prolene suture.  The flow was measured through the graft and was excellent.  The second distal anastomosis was performed to the second diagonal branch. The internal diameter was also 1.6 mm.  The conduit used was a second segment of greater saphenous vein.  Anastomosis was performed in an end-to-side manner using a continuous 7-0 Prolene suture.  The flow was measured through the graft and was excellent.  Then another dose of cardioplegia was given down the vein graft and into the aortic root.  The third distal anastomosis was performed to the midportion of the left anterior descending coronary artery.  The internal diameter was 2 mm.  The conduit used was the left internal mammary artery graft and this was brought through an opening in the left pericardium anterior to the phrenic nerve.  It was anastomosed to the LAD in end-to-side manner using continuous 8-0 Prolene suture.  The pedicle was tacked to the epicardium with 6-0 Prolene sutures. The patient was rewarmed to 37 degrees and the clamp removed from the mammary pedicle.  There was rapid warming of the ventricular septum and return of spontaneous ventricular fibrillation.  The crossclamp was removed with a time of 45 minutes and the patient defibrillated into sinus rhythm.  A partial occlusion clamp was placed on the aortic root and the two proximal vein graft anastomoses were performed in end-to-side manner using continuous 6-9 Prolene suture.  The clamps were removed, the vein grafts deaired and the clamps removed from them.  The proximal and distal anastomoses appeared  hemostatic and the line of the grafts satisfactory.  Graft  markers were placed around the proximal anastomoses.  Two temporary right ventricular and right atrial pacing wires were placed and brought out through the skin.  When the patient had rewarmed to 37 degrees centigrade, she was weaned from cardiopulmonary bypass on no inotropic agents.  Total bypass time was 79 minutes.  Cardiac function appeared good with cardiac output of 5L per minute. Protamine was given and the venous and aortic cannulas were removed without difficulty.  Hemostasis was achieved.  Three chest tubes were placed with a tube in the posterior pericardium and one in the left pleural space and one in the anterior mediastinum.  The pericardium was reapproximated over the heart. The sternum was closed with #6 stainless steel wires.  The fascia was closed with continuous #1 Vicryl suture.  The subcutaneous tissues were closed using continuous 2-0 Vicryl and the skin with 3-0 Vicryl subcuticular closure.  The lower extremity vein harvest site was closed in layers in a similar manner. The sponge, needle and instrument counts were correct according to the scrub nurse.  A dry sterile dressing was applied over the incisions and around the chest tubes which were hooked to Pleur-evac suction.  The patient remained hemodynamically stable and was transported to the SICU in guarded but stable condition. DD:  11/11/00 TD:  11/11/00 Job: [id number]

## 2010-11-03 NOTE — H&P (Signed)
[place]. [facility]  Patient:    [name]                           MRN: [id number] Adm. Date:  [id number] Attending:  Anastasio Auerbach CC:         Al Decant. Janey Greaser, M.D.                         History and Physical  CHIEF COMPLAINT:  [name] is a 77 year old white female admitted with acute renal failure.  HISTORY OF PRESENT ILLNESS:  About two weeks prior to admission she saw Dr. Clarene Duke for nausea, right lower quadrant pain, and diarrhea.  He wondered about a virus  versus diverticulitis and placed the patient on Cipro.  Dr. Clarene Duke also sent her to see Dr. Randa Evens who had seen the patient in the past for a nonspecific colitis.  Dr. Randa Evens added metronidazole.  About that time her legs started to hurt. She called Dr. Randa Evens nurse who did not think that it was a reaction to the medications.  Two days prior to admission, however, she stopped the Cipro and metronidazole and was seen in the walk-in clinic by Dr. Tery Sanfilippo because of the bilateral leg pain.  She had also begun to notice some redness of the skin and o Keflex was added.  She was hurting all weekend, continued to be nauseated and now started to vomit, and so she went to see Dr. Janey Greaser today.  He did some blood ork and found that her creatinine was 14.  She had not noted any decrease in urinary output recently.  PAST MEDICAL HISTORY:  SURGICAL:  Meningioma resection.  Hysterectomy without BSO.  Breast biopsy x 3.   MEDICAL:  Kidney stones.  Hypertension.  Rosacea.  Nonspecific colitis.  MEDICATIONS:  1. Lopressor 100 mg daily.  2. Diovan/HCT 160/12.5 daily.  3. Premarin 0.625 mg daily.  ALLERGIES:  SULFA (rash and hives).  ACE INHIBITORS (cough).  SOCIAL HISTORY:  She lives with her husband.  She has a supportive family.  She  does not consume alcohol or smoke cigarettes.  FAMILY HISTORY:  Mother died of myocardial infarction and diabetes, age 40. Father died of myocardial  infarction in his 87s.  Brother died of MI and diabetes. Sister died of MI and diabetes.  Several other siblings have diabetes.  REVIEW OF SYSTEMS:  All other systems are negative.  PHYSICAL EXAMINATION:  GENERAL:  She is alert, in no distress.  VITAL SIGNS:  Blood pressure 132/64, pulse 60 and regular, respiratory rate 16 nd unlabored.  HEENT:  The eyes have pupils which are round and reactive to light.  The extraocular movements are intact.  The funduscopic exam is normal.  The ears are normal.  The nose and throat are unremarkable.  NECK:  Supple.  The thyroid is not enlarged or tender.  CHEST:  Clear to auscultation and percussion.  CARDIAC:  Regular rhythm and rate with a normal S1 and S2, without an S3, S4, murmur, rub, or click.  ABDOMEN:  Slight distention with mild diffuse tenderness throughout.  Possibly worse in the right lower quadrant.  EXTREMITIES:  Erythema of both anterior shins, worse on the left than the right, with tenderness of the skin.  There is no edema.  NEUROLOGIC:  Oriented x 3.  Cranial nerves are intact.  Motor is 5/5.  LABORATORY DATA:  BUN 101, creatinine  14, CO2 17, calcium 6.5, glucose 126, sodium 136, potassium 4.5.  Liver function tests normal.  EKG and chest x-rays are pending.  Urinalysis pending.  IMPRESSION:  1. Acute renal failure.  2. Nausea.  3. Leg pain and erythema, bilaterally.  4. History of recent abdominal pain.  5. Remote history of colitis.  6. Remote history of meningioma resection.  The cause for acute renal failure is not clear to me.  She has no indication for emergent dialysis but we will begin to work up by doing an ultrasound of her abdomen.  This will exclude post renal causes.  Also check a fractional excretion of sodium on her urinalysis, as well as obtain urinalysis for eosinophils and standard urinalysis.  The relationship to her leg pain and erythema is unclear to me.  It seems unlikely that she  would have bilateral leg cellulitis.  She may need antibiotics but I am going to hold them for now as I wonder about any role of an allergic reaction to any of her medications she has been on.  I will hold Diovan for now but continue Lopressor. DD:  07/17/99 TD:  07/17/99 Job: [id number]

## 2010-11-03 NOTE — H&P (Signed)
NAMESHAKERRIA, PARRAN                   ACCOUNT NO.:  [id number]   MEDICAL RECORD NO.:  [id number]          PATIENT TYPE:  INP   LOCATION:  3705                         FACILITY:  [facility]   PHYSICIAN:  Carla Shad. Rudean Curt, MD     DATE OF BIRTH:  07/16/1928   DATE OF ADMISSION:  07/21/2006  DATE OF DISCHARGE:                              HISTORY & PHYSICAL   CHIEF COMPLAINT:  Chest pain.   HISTORY OF PRESENT ILLNESS:  [name] is a 77 year old female with a  history of diabetes, hypertension and coronary artery disease, who  presented to the emergency department today with approximately 24 hours  of chest pain.  The pain began gradually yesterday, and it was described  as tightness or pressure in the middle of her chest, as well as going  across both the right and left side of her upper abdomen and lower ribs.  This pain was not associated with palpitations, syncope, dizziness,  difficulty breathing, cough or leg swelling.  She denied nausea,  vomiting, fever, chills or flushing.   PAST MEDICAL HISTORY:  1. Mild diabetes mellitus.  2. Hypertension.  3. Coronary artery disease, status post triple CABG surgery in 2002.  4. Meningioma removed from the left frontal lobe in 1993.  5. Partial hysterectomy in 1977.  6. Gastroesophageal reflux.  7. Rosacea.  8. Celiac sprue.   MEDICATIONS:  1. Benicar 40 mg daily.  2. Lasix 20 mg daily.  3. Protonix 40 mg daily.  4. Actos 30 mg daily.  5. Caduet 5/20 mg daily.  6. Hydrocortisone 2.5% ointment for her face when needed.  7. Ketoconazole 2% shampoo twice weekly.  8. Pro-topical ointment 0.1% to the nose and eyebrows when needed.  9. TobraDex eye ointment once daily for one week of the month.  10.Aspirin 81 mg daily.  11.Multivitamins.  12.Calcium.  13.Vitamin B6 and B12 supplements.   ALLERGIES:  DILANTIN, ACCUTANE, SULFA, CIPRO AND PENICILLIN.   SOCIAL HISTORY:  No history of smoking.   PHYSICAL EXAMINATION:  VITAL SIGNS:  Temperature  98, heart rate 72,  blood pressure 140/65, respiratory rate 20, oxygen saturation 96% on  room air.  GENERAL APPEARANCE:  Alert, in no acute distress.  HEENT:  Rosacea was seen on the face.  Mucous membranes were moist.  No  jugular venous distention.  CHEST:  Clear to auscultation bilaterally.  CARDIOVASCULAR:  Regular, normal S1 and S2.  No murmurs, rubs or  gallops.  ABDOMEN:  Normal bowel sounds, soft, nontender, nondistended, no  organomegaly.  EXTREMITIES:  No peripheral edema.  Pulses were strong.  There is no  tenderness to palpation over the chest Armentor.   LABORATORY STUDIES:  Sodium 140, potassium 3.3, chloride 104,  bicarbonate 28, glucose 98, BUN 17, creatinine 0.9, calcium 9.2.  CBC  was normal.  Hemoglobin was 14.4.  CK-MB 1.4, troponin less than 0.05.  Chest x-ray showed possible small nodule at the left lung base and  changes suggestive of COPD and chronic bronchitis.  EKG:  Normal sinus  rhythm, no ischemic changes.   ASSESSMENT/PLAN:  1. Chest pain.  This is atypical chest pain in a patient who has risk      factors for coronary artery disease and has had known coronary      artery disease in the past.  There is no current evidence of      ischemia, and a clinical presentation is not suggestive of angina.      I will institute full dose aspirin and admit the patient to the      hospital for telemetry monitoring and serial cardiac enzymes.  The      patient has followup scheduled with her cardiologist in 2 months.      If she remains well within this hospitalization, I will discharge      her tomorrow and suggest sooner followup with [name].  2. Hypertension.  I will continue the patient's home medications.  3. Diabetes.  I will continue the patient to Actos and monitor blood      sugars.  4. Celiac sprue.  The patient will continue a gluten-free diet.  5. Acid reflux.  The patient's symptoms may suggestive of acid reflux,      especially given their epigastric  focus.  I will put the patient on      twice daily Protonix, instead of her usual once daily dosing, as      well as providing Maalox, should she have acute symptoms.      Carla Shad. Rudean Curt, MD  Electronically Signed     PML/MEDQ  D:  07/21/2006  T:  07/21/2006  Job:  [id number]   cc:   [name], M.D.

## 2010-11-03 NOTE — Cardiovascular Report (Signed)
[place]. [facility]  Patient:    [name], [name]                          MRN: [id number] Proc. Date: 11/10/00 Adm. Date:  [id number] Attending:  Armanda Magic CC:         Armanda Magic, M.D.  Alleen Borne, M.D.  Vikki Ports, M.D., [facility]   Cardiac Catheterization  INDICATION FOR PROCEDURE:  The patient is 50 and has a strong family history of coronary artery disease.  She was admitted to [facility] on Nov 07, 2000, with atypical chest pain and underwent Cardiolite scintigraphy which demonstrated a large area of anterior and apical ischemia.  Aggressive medical therapy including Integrilin, Lovenox, beta blocker therapy, IV nitroglycerin has failed to control her ongoing symptoms.  Despite the absence of EKG changes and enzyme markers, she is going to have an urgent cardiac catheterization to define coronary anatomy and hopefully help guide therapy.  PROCEDURE PERFORMED: 1. Left heart catheterization. 2. Selective coronary angiography. 3. Left ventriculography.  DESCRIPTION OF PROCEDURE:  After informed consent, a 7-French sheath was inserted into the right femoral artery using the modified Seldinger technique. A 6-French A2 Multipurpose catheter was used for a left ventriculography by hand injection and hemodynamic recordings.  A #4 left and a #4 right Judkins 6-French catheter was used for left and right coronary angiography.  The patient tolerated the procedure without complications.  ACT was obtained post procedure.  The sheath was pulled, and hemostasis was achieved.  RESULTS:   I. Hemodynamic data      A. Aortic pressure 131/67.      B. Left ventricular pressure 131/5 mmHg.  II. Left ventriculography:  The left ventricle is normal in size and      demonstrates normal overall contractility.  Overall ejection fraction      was greater than 70%.  No mitral regurgitation was noted. III. Selective coronary  angiography      A. Left main coronary:  The left main is long and free of any         significant obstruction.      B. Left anterior descending coronary:  The LAD is a large vessel that         wraps around the left ventricular apex.  The vessel is very tortuous.         There is mild ostial narrowing of up to 30%.  The LAD gives origin to         two large diagonal branches.  The LAD beyond the second diagonal         becomes very tortuous as it wraps around the left ventricular apex.         The LAD contains irregularities noted proximal to the first diagonal         and between the first and second diagonal.  There is 40% narrowing         before the origin of the second diagonal.  There is a 75-85% lucent         stenosis after the second diagonal in the second large septal         perforator.  This is a relatively focal stenosis.  The first diagonal         is large and bifurcates.  It contains a subtotal occlusion in its         proximal third.  This stenosis is at least 95%, and perhaps as much as         99%.  The ostium of the first diagonal contains 50% narrowing.  This         branch is large.  The second diagonal contains a 70-75% ostial         stenosis.  This vessel is moderate in size.      C. Circumflex artery:  The circumflex artery gives origin to four obtuse         marginal branches.  The circumflex is free of any significant         obstruction as are the obtuse marginal branches.  All of the         circumflex marginal branches are very tortuous.      D. Ramus branch:  The ramus branch arises from the crux of the left main         bifurcation and contains a 60-70% stenosis.  This vessel is relatively         small.      E. Right coronary:  The right coronary is a large dominant vessel.  It         gives rise to a large PDA and two small left ventricular branches.  No         significant obstruction is noted in the right coronary.  The right         coronary is very  tortuous.  CONCLUSIONS: 1. Severe LAD and diagonal #1 and #2 disease.  The LAD and diagonals are the    culprit lesions for the patients presenting symptoms.  The right and    circumflex coronary arteries are free of any significant obstruction. 2. Normal LV function.  PLAN:  PCI would seem to carry a relatively high risk of complications given the severe tortuosity noted in the distal LAD and also involvement of the ostia of both the first and second diagonals.  The first diagonal is also severely diseased proximally at a branch point.  The tortuosity in the LAD and the ostial disease noted in each diagonal as well as the branching nature of the first diagonal all make PCI on these three sites higher than usual risk.  Given the intense ischemia noted on Cardiolite scintigraphy, perhaps coronary artery bypass grafting would be more appropriate therapy. Will get a CVTS consult.  If surgery is not possible, PCI on the first diagonal and the LAD would seem appropriate.  I am not sure we would   attempt PCI on the second diagonal. DD:  11/10/00 TD:  11/10/00 Job: [id number]

## 2010-11-03 NOTE — H&P (Signed)
[place]. [facility]  Patient:    [name], [name]                          MRN: [id number] Adm. Date:  [id number] Attending:  Armanda Magic Dictator:   Anselm Lis, N.P. CC:         Al Decant. Janey Greaser, M.D.  Anna Genre Little, M.D.   History and Physical  DATE OF BIRTH:  1928/09/02  IMPRESSION:  (As per Armanda Magic, M.D.)  1. Upper abdominal discomfort atypical for angina in this 77 year old obese     female with history of borderline diabetes, history of hypertension and     positive family history of coronary artery disease, and history of     elevated triglycerides.  Discomfort seemed improved somewhat after     Maalox (earlier in primary M.D. office) and with sublingual nitrate     here in the emergency room.  Electrocardiogram with slight T wave     inversion 3 and aVF (slightly changed from baseline).  Currently     continues with a knotting sensation of upper abdomen and back discomfort     precipitated with deep breath.  Suspect symptoms are GI and/or     musculoskeletal in etiology.  2. History of hypertension; blood pressure elevated here in the emergency     room at 176/74.  3. History of dyslipidemia; triglycerides elevated to 727 with low density     lipoprotein of 65 and high density lipoprotein of 39.  Elevations are     secondary to Accutane.  Accutane was discontinued and recheck of profile     on April of 2002 revealed triglycerides of 235, low density lipoprotein of     65, and high density lipoprotein of 39.  Repeat triglycerides off     Accutane, April 2002 were 235.  4. History of acute renal failure secondary to allergic interstitial     nephritis with creatinine as high as 14.  This is in the setting of     dehydration, concommitant use of ARB and probably final insult secondary     to allergic reaction from Cipro and/or flagyl.  Consulted by Dr. Darrick Penna     that hospital admission, January of 2001.  Resolved with witholding  of     these agents and rehydration.  5. History of nonspecific colitis, chronic episodic diarrhea.  6. Resection of a right brain menigioma, October of 1992, which was benign.  7. History of breast biopsy x 3; benign.  8. History of rosacea.  9. History of kidney stones; February of 2001 renal ultrasound okay. 10. History of hysterectomy without BSO, tonsillectomy. 11. History of gastroesophageal reflux disease; esophageal/gastric junction     irritation per patient (Dr. Randa Evens). 12. Hyperglycemia; attempting weight loss control.  PLAN:  (As per Armanda Magic, M.D.) 1. Admit to telemetry, rule out MI protocol with serial cardiac enzymes and    daily EKG.  If she ruled out, will plan follow-up in or outpatient    Cardiolite to assess for evidence of myocardial ischemia. 2. Fasting statin profile. 3. Proton pump inhibitor, IV nitrates, and subcu Lovenox. 4. Supplement potassium with first serum potassium of 3.6.  HISTORY OF PRESENT ILLNESS:  [name] is a pleasant 77 year old obese female with history of hypertension and borderline diabetes mellitus who developed yesterday afternoon upper abdominal "knots" and also with anterior diaphragmatic and posterior midback "achiness" precipitated by deep  inspiration. No associated nausea, diaphoresis, shortness of breath, nor radiation discomfort.  Symptoms persisted into this morning.  She is evaluated by primary M.D. where office EKG revealed T wave abnormality inferiorly which was exacerbated from baseline.  She was transferred to [facility] emergency room where her CK is negative.  Other enzymes pending. Other labs within normal range.  The patient has a long history of the same "knot" upper abdomen, seemed helped in the past by antacids/proton pump inhibitors.  She received a GI cocktail here in the emergency room with gradual improvement.  Sublingual nitrate earlier also seemed to transiently ease  discomfort.  ALLERGIES:  DILANTIN and ACCUTANE causing increased LFTs.  CIPRO, FLAGYL, KEFLEX combination causing acute tubular necrosis and a rash.  SEPTRA and DILANTIN causing rash.  ACE INHIBITOR causing cough.  SULFA causing hives.  SOCIAL HISTORY:  Tobacco, EtOH; negative.  Caffeine; not excessive.  The patient is married for 40 years.  She has four children alive and well.  She is independent in ADLs, driving around, and doing her chores at home without problems.  FAMILY HISTORY:  Mother died at age 82 of diabetes mellitus and cardiac problems, father died at age 74 of "heart problems."  Two brothers who both had bypass surgery, one at age 63 and one at age 28.  One sister died and a brother died both of heart disease and complications related to diabetes mellitus.  REVIEW OF SYSTEMS:  As in HPI and past medical history.  Otherwise denies problems with lightheadedness, dizziness, syncope, nor near syncopal episodes. Episodic dysphagia.  Episodic diarrhea.  Chronic lower abdominal discomfort to applied pressure.  She has reflux symptoms felt as throat burning and associated sour taste, usually relieved with TUMS tablets.  Denies melena or bright red blood per rectum.  Negative dysuria nor hematuria, though, does have some occasional feelings of urinary retention.  Positive lower extremity swelling thought secondary to Norvasc.  Denies symptoms of claudication, palpations.  Denies DOE nor orthopnea, though, head of bed elevated because of GERD.  PHYSICAL EXAMINATION:   (As performed by Armanda Magic, M.D.)  VITAL SIGNS:  Blood pressure 176/74, heart rate 84 and regular, respiratory rate 20, temperature 97.8.  O2 saturation 97% room air.  GENERAL:  She is obese, older female in no acute distress.  Slightly anxious.  HEENT:  Brisk bilateral carotid upstroke without bruit.  No JVD and no thyromegaly.  CHEST: Lung sounds clear.  HEART:  Regular rate and rhythm without murmurs,  rubs, or gallops.  Normal S1 and S2.   ABDOMEN:  Obese.  Slightly distended.  Normal bowel sounds.  Negative masses. No organomegaly.  Slight tenderness to applied pressure left abdomen and right lower abdomen.  This is her baseline per patient.  EXTREMITIES:  Distal pulses intact.  Negative pedal edema.  NEUROLOGICAL:  Nonfocal.  Alert and oriented x 3.  RECTAL:  Deferred.  LABORATORY DATA:  Hemoglobin 14.2, hematocrit 41.6, platelets 358, WBC 7.7. Sodium 137, potassium 3.6, chloride 99, CO2 28, BUN 8, creatinine 0.8, and glucose 111.  LFTs within normal range.  CK 81, MB fraction and troponin I are pending.  Chest x-ray revealed no active disease.  Pro time of 14.6, INR 1.3, PTT 26.  EKG from today revealed NSR at 106 beats per minute with T wave abnormality inferiorly.  No acute ischemic changes.  T wave abnormalities seemed more pronounced than that in primary M.D. office earlier this morning which in its turn is showing  inversion where as baseline had T wave flattening inferior leads. DD:  11/07/00 TD:  11/07/00 Job: [id number]

## 2010-11-03 NOTE — Discharge Summary (Signed)
[name], [name]                   ACCOUNT NO.:  [id number]   MEDICAL RECORD NO.:  [id number]          PATIENT TYPE:  INP   LOCATION:  3705                         FACILITY:  [facility]   PHYSICIAN:  Andres Shad. Rudean Curt, MD     DATE OF BIRTH:  04-14-1929   DATE OF ADMISSION:  07/21/2006  DATE OF DISCHARGE:  07/23/2006                               DISCHARGE SUMMARY   DISCHARGE DIAGNOSIS:  Noncardiac chest pain.   SUMMARY OF HOSPITALIZATION:  [name] is a 77 year old female with a  history of diabetes, hypertension, coronary artery disease, status post  CABG surgery in 2002.  She presented to the emergency department on  February 3, complaining of approximately 24 hours of chest pain.  The  pain had begun gradually and was described as tightness or pressure in  the middle of her chest as well as going across the right and left side  of her upper abdomen and lower ribs. The pain was not associated with  palpitations, syncope, dizziness, difficulty breathing, cough or leg  swelling.   Her initial clinical examination was unremarkable.  Her EKG showed  normal sinus rhythm with no ischemic changes.  A chest x-ray was  significant for a small nodule at the left lung base as well as changes  suggestive of COPD and chronic bronchitis.  The radiology reading  suggestive CT scan for followup.  The patient was monitored in the  hospital.  Several sets of cardiac enzymes were negative.   By hospital day #2, the pain had localized the right side of her upper  abdomen or lower chest, underneath the ribs.  An abdominal ultrasound  was performed but did not reveal any abnormalities in the gallbladder or  liver.  Of note, in addition, liver function tests and bilirubins were  ordered.  These were all within normal limits except for a slightly low  albumin of 3.3.   The etiology of her chest pain is unclear.  It appears to be localized  to the right side and this is quite atypical for pain related a  cardiac  problem.  There is no evidence of pneumonia or hepatobiliary disease.  Because of her history of coronary artery disease, as well as coronary  artery disease risk factors, it would be appropriate for her to follow  up with her cardiologist or potentially have an outpatient stress test.  Additionally, because of the lung nodule seen on her chest CT, it would  be appropriate to consider a  CT scan of the chest on an outpatient to  evaluate this.  Alternatively, a followup chest x-ray in 6 months could  assess of any progression of this finding.      Andres Shad. Rudean Curt, MD  Electronically Signed     PML/MEDQ  D:  07/23/2006  T:  07/23/2006  Job:  [id number]   cc:   Dr. Janey Greaser

## 2010-11-03 NOTE — Discharge Summary (Signed)
[place]. [facility]  Patient:    [name], [name]                          MRN: [id number] Adm. Date:  [id number] Disc. Date: 11/16/00 Attending:  Cleatrice Burke Dictator:   Dominica Severin, P.A. CC:         Armanda Magic, M.D.  Al Decant. Janey Greaser, M.D.   Discharge Summary  ADMISSION DIAGNOSES:  1. Epigastric pain/chest pain, rule out myocardial infarction.  2. Borderline diabetes mellitus, diet controlled.  3. Hypertension.  4. Positive family history of coronary artery disease.  5. Hypertriglyceridemia.  6. History of acute renal failure.  7. History of nonspecific colitis.  8. Status post resection of right brain meningioma, benign.  9. Status post breast biopsy x 3, all benign. 10. History of rosacea. 11. History of nephrolithiasis. 12. History of gastroesophageal reflux disease. 13. Allergies to Dilantin which causes a rash, Accutane which causes increased     liver function tests, Septra, Cipro, Flagyl, Keflex,     angiotensin-converting enzyme with side effects of a dry cough and sulfa     medications causes hives.  DISCHARGE DIAGNOSES: 1. Postoperative anemia, resolving. 2. Status post coronary artery bypass graft surgery x 3. 3. Mild hyperglycemia postoperatively.  PROCEDURES: 1. Cardiolite stress test on Nov 07, 2000. 2. Cardiac catheterization on Nov 10, 2000. 3. Coronary artery bypass graft surgery x 3 with the following grafts placed:    Left internal mammary artery to the left anterior descending artery,    saphenous vein graft to the diagonal-1 branch and saphenous vein graft to    the diagonal-2 branch on Nov 11, 2000. 4. Transfusion of one unit of packed red blood cells on Nov 13, 2000. 5. Transthoracic echocardiogram on Nov 14, 2000.  HISTORY OF PRESENT ILLNESS:  The patient is a 77 year old, Caucasian female who is obese, with multiple cardiac risk factors including hypertension and borderline diabetes who had presented with  vague abdominal pain which she described as a knot in her upper abdomen.  She did not have any nausea or vomiting.  She had no shortness of breath.  Initially, she denied chest pain. She was seen by her primary care physician and electrocardiogram showed T wave abnormality inferiorly.  Therefore, she was transferred to [facility].  HOSPITAL COURSE:  She received GI cocktail with some improvement.  She had also received some sublingual nitroglycerin which seemed to improve the discomfort also.  She was then admitted to rule out myocardial infarction. The CPK isoenzymes remained negative.  Troponin I was 0.03.  She continued to have chest pain after her admission, although the electrocardiogram still did not show any ischemic changes.  She underwent a Cardiolite scan which reportedly showed some focal ischemia in the anterior and apical lateral Singleterry. Due to the repeat episodes of chest pain, she was then taken to the cardiac catheterization lab which showed severe single vessel disease with the left anterior descending artery.  It was felt at that time that a surgical consultation was therefore needed.  Dr. Laneta Simmers saw the patient in consultation and it was agreed that the patient would benefit from coronary avascularization.  The patient underwent surgery as stated above on Nov 11, 2000.  She tolerated the procedure well.  Her postoperative course was notable for some postoperative anemia requiring a transfusion and was notable for some postoperative hyperglycemia which did not require any oral medications to be added,  although did require some insulin to be supplemented to help control her blood sugars.  Nevertheless, the patient did not have any cardiac or respiratory complications.  She was ambulated daily by cardiac rehabilitation phase I.  Her hematocrit slightly progressed upward and the patients oxygen saturation did improve.  The patient is anticipated for discharge on the morning of  November 16, 2000.  CONDITION ON DISCHARGE:  Stable.  DISCHARGE MEDICATIONS: 1. Premarin 0.625 mg once a day. 2. Coated aspirin 325 mg once a day. 3. Colace 200 mg once a day. 4. Niferex 150 mg once a day with meals. 5. Lopressor 25 mg every 12 hours. 6. Lasix 40 mg once a day x 7 days. 7. Potassium chloride 20 mEq once a day x 7 days. 8. Lopid 600 mg b.i.d. and q.a.c. 9. Tylox one to two tablets q.4-6h. p.r.n. pain.  ACTIVITY:  The patient is instructed not to do any driving or lifting more than 10 pounds.  Continue to walk daily and continue her breathing exercises.  DIET:  Follow a low fat, low sodium diet, diabetic diet.  FOLLOWUP:  Follow up with Dr. Armanda Magic on December 02, 2000, at 1:30 p.m. for a chest x-ray and then an office visit at 2 p.m.  She is to bring that chest x-ray with her to the appointment with Dr. Laneta Simmers on Tuesday, June 18, at 9:15 a.m.  Follow up with Dr. Janey Greaser in four to six weeks.  SPECIAL INSTRUCTIONS:  The patient was told that she could shower, clean the wounds with mild soap and water and call the office if any wound problems, otherwise as noted on the instruction sheet. DD:  11/15/00 TD:  11/15/00 Job: [id number]

## 2010-11-03 NOTE — Procedures (Signed)
[place]. [facility]  Patient:    [name], [name] Visit Number: [id number] MRN: [id number]          Service Type: END Location: ENDO Attending Physician:  Orland Mustard Dictated by:   Llana Aliment. Randa Evens, M.D. Proc. Date: 05/23/01 Admit Date:  05/23/2001   CC:         Al Decant. Janey Greaser, M.D.  Armanda Magic, M.D.   Procedure Report  NAME OF PROCEDURE:  Colonoscopy.  ENDOSCOPIST:  Llana Aliment. Randa Evens, M.D.  MEDICATIONS:  Fentanyl 25 mcg and Versed 3 mg IV.  SCOPE:  Olympus pediatric video colonoscope.  INDICATIONS:  Recent rectal bleeding and history of anemia in a woman who has a sister with colon cancer.  DESCRIPTION OF PROCEDURE:  Procedure had been explained to the patient; consent obtained.  With the patient in the left lateral decubitus position the Olympus pediatric video colonoscope was inserted and advanced under direct visualization.  The prep was excellent.  We were able to reach the cecum without difficulty.  The ileocecal valve and appendiceal orifice seen.  Scope withdrawn in the cecum, ascending colon, hepatic flexure, transverse colon, splenic flexure, descending and sigmoid colon were seen well upon removal.  No polyps.  No significant diverticular disease.  The scope was withdrawn to the rectum and small internal hemorrhoids were seen.  Patient tolerated the procedure well.  ASSESSMENT: 1. No evidence of further polyps. 2. Internal hemorrhoids.  PLAN: 1. Will recommend repeating in five years due to her family history of    colon cancer. 2. Will see back in the office in six months for follow up for celiac    disease. Dictated by:   Llana Aliment. Randa Evens, M.D. Attending Physician:  Orland Mustard DD:  05/23/01 TD:  05/23/01 Job: [id number]

## 2010-11-03 NOTE — Discharge Summary (Signed)
[place]. [facility]  Patient:    [name]                           MRN: [id number] Adm. Date:  [id number] Disc. Date: [id number] Attending:  Anastasio Auerbach CC:         [name]. [name], M.D.             [name], M.D.             [name], M.D.                           Discharge Summary  DATE OF BIRTH:  Apr 09, 1929  DISCHARGE DIAGNOSES:  1. Acute renal failure, resolving.     a. Etiology ?Marland Kitchen     b. Allergic interstitial nephritis ?.     c. Acute tubular necrosis secondary to dehydration plus Diovan/HCTZ.  2. Rash.     a. Back/sides/anterior lower extremities.     b. Responded to steroid therapy.  3. Abdominal pain/diarrhea, chronic.     a. ? nonspecific colitis.  4. Hypertension.  5. Hypokalemia.     a. Upon resolution of renal failure.  6. Asymptomatic bradycardia.     a. ? medication related.  7. Anemia.  8. Status post hysterectomy.  9. History of rosacea. 10. History of kidney stones.     a. Last stone less than five years ago. 11. Meningioma resection, October 1992. 12. History of breast biopsy x 3.  ALLERGIES:  1. SULFA (rash and hives).  2. ACE INHIBITORS (cough).  3. CIPRO/FLAGYL/KEFLEX (rash/renal failure).  DISCHARGE MEDICATIONS:  1. Prednisone 10 mg 5 pills on July 25, 1999; 4 pills on July 26, 1999;     3 pills on July 27, 1999; 2 pills on July 28, 1999; 1 pill on July 29, 1999, and then stop.  2. Lopressor 50 mg 1/2 tablet daily (decreased from 1 daily because of a low heart    rate. 3. Amlodipine (Norvasc) 10 mg daily (new). 4. Premarin 0.625 mg q.d. 5. Darvocet-N 100 1-2 pills every four hours as needed for pain (maximum eight er    day), dispense #20, no refills.  * Do not take Diovan/HCTZ 160/12.5. * No need to use any topical steroids or creams specifically.  CONDITION ON DISCHARGE:  Stable.  Blood pressure lying 147/72, heart rate 48; sitting 164/64, heart rate 49; standing  130/63, heart rate 57.  Rash on sides and legs nearly completely resolved.  Only faint outline.  Abdomen obese, with some  right upper and lower quadrant tenderness.  No rebound.  RECOMMENDED ACTIVITY:  As tolerated.  RECOMMENDED DIET:  Low salt.  FOLLOW-UP:  The patient is to contact Dr. Wynelle Bourgeois office at 8 oclock on Thursday, July 27, 1999, to get an appointment for later that day.  At that visit, she will need her blood pressure checked, as well as a basic metabolic panel obtained.  Her potassium was 3.3 on the day of discharge, and she was given 40 Eq orally prior to discharge.  CONSULTATIONS:  Dr. Fayrene Fearing Bryant.  PROCEDURES: 1. Renal ultrasound (July 17, 1999):  No evidence of stones, masses, or    hydronephrosis.  Bladder appears relatively full with sluggish, but present,    ureteral jet. 2. Urine eosinophils (July 17, 1999):  No eosinophils identified. 3. Repeat urine eosinophils (July 20, 1999):  Scattered eosinophils present.    Fungal organisms consistent with Candida. 4. Abdominal ultrasound (July 24, 1999):  Contracted gallbladder.  No stones.    Normal common bile duct.  Echogenic pancreas.  HOSPITAL COURSE: #1 - ACUTE RENAL FAILURE:  This 78 year old Caucasian woman with hypertension and a history of nonspecific colitis had a severe diarrheal illness two to three weeks prior to admission.  At that time, she was treated with both Cipro and Flagyl. She supposedly became volume depleted, with decreased urine output and poor appetite. Two days prior to admission she stopped the Cipro and Flagyl, and was seen in the walk-in clinic because of bilateral leg pain.  At that time, she was noted to have some redness over her anterior shins, and Keflex was added.  She became progressively nauseated, and saw Dr. [name] in the office on the day of admission. He did blood work at that time, and found her creatinine to be 14.0.  She was referred for  treatment and further workup.  Renal ultrasound was done, which demonstrated no hydronephrosis.  There were comments about a sluggish ureteral et, but the significance of this is unclear.  She was orthostatic and felt to be overall volume depleted.  Dr. Darrick Penna was consulted, and felt that this could  possibly be related to acute tubular necrosis after an episode of diarrhea resulting in dehydration.  This, in addition to being on Diovan, can lead to renal failure.  We were also concerned about the contribution of recent antibiotics and, therefore, checked a urine for eosinophils.  This was initially negative, as was her urinary sediment.  She had no white cells or red cells or casts.  She subsequently developed a worsening rash in the hospital, which was subsequently  treated with IV steroids.  We repeated her urine eosinophils four days into admission, and there were some present.  This further supported the idea of an allergic interstitial nephritis.  Which antibiotic she is allergic to is still unclear.  It seems that Cipro and Flagyl are the most likely causative agents just based on when they were started in relation to her renal failure and rash. Although she did receive Keflex a few days prior to admission, the renal insufficiency had, undoubtedly, begun prior.  She was aggressively hydrated in he hospital.  We did send other studies to include an ANA, serum and protein protein electrophoresis, complement levels, ASO, and a total CK.  These were unremarkable for the most part.  She did have a sedimentation rate of 35.  With hydration, her kidney function improved and, upon discharge, her BUN was 50 with a creatinine f 1.9.  I think the elevated BUN is also related to the start of steroids.  She will have her kidney function and potassium rechecked on Thursday by Dr. [name]. It is not unreasonable to rechallenge her with Diovan, as she had been on that for a ong time  previously, and its contribution to the renal failure was likely only in  combination with dehydration.  #2 - RASH:  Discussed above.  The patient will go home on a short prednisone taper. She was having some lower extremity pain despite marked improvement of the rash.  I did send her home with 20 Darvocet.  #3 - HYPERTENSION:  [name] was hypertensive up to a blood pressure of 204 systolic here in the hospital.  Of course, her Diovan and hydrochlorothiazide had been stopped, and she remained on Lopressor as a single  agent.  Her admission heart rate ranged from 50-62.  Clonidine 0.2 mg twice a day was started, and this dropped her heart rate into the 40s.  It was asymptomatic, but I was uncomfortable continuing this.  Upon discharge, she was on Norvasc, a new medication for her, and a decreased dose of Lopressor.  Her blood pressure will be rechecked in follow-up and, if her heart rate is okay, we can increase the Lopressor; if her kidney function is stable over the next week or two, we could re-add Diovan with very close follow-up and instructions for the patient to stop taking this if she becomes dehydrated for any reason.  #4 - ABDOMINAL PAIN/DIARRHEA:  [name] has had chronic problems with diarrhea, and Dr. Randa Bryant has seen her in the past.  She had a colon biopsy which showed nonspecific inflammation.  She was placed on sulfasalazine for a period of time. The patients brother tells me that he was diagnosed with celiac sprue, and he had concerns that his sister might have the same thing.  I conveyed these concerns o Dr. Randa Bryant, who will see the patient in four to six weeks after discharge. This is to allow the renal function to normalize and to normalize before drawing any  specific antibodies or proceeding with further workup.  She will contact him if  there are problems in the meantime.  We did do an ultrasound here because of some significant right upper  quadrant tenderness on Dr. Mayford Knife exam on Sunday. This demonstrated a contracted gallbladder with no stones and a normal common bile duct and an echogenic pancreas.  The significance of the latter is unclear.  It can e seen with mild fatty infiltration of the pancreas.  No further workup for that recommended at this time.  #5 - ASYMPTOMATIC BRADYCARDIA:  [name] had heart rates ranging from 48-57 prior to discharge.  These were asymptomatic.  #6 - ANEMIA:  [name] had an admission hemoglobin of 13.4.  It dropped to 11.9 and remained stable.  It is most likely related to the acute illness, and would  repeat in four to six weeks.  DISCHARGE LABORATORY DATA:  Sodium 135, potassium 3.3 (patient will receive 40 Eq of oral potassium prior to discharge), chloride 95, bicarbonate 27, BUN 50, creatinine 1.9, glucose 112.  Hemoglobin 11.9, WBC 12,200 (increased secondary o steroids), platelet count 386.  Sedimentation rate 35.  Guaiac-negative.  Serum  immunofixation negative for monoclonal spike.  Total CK 78.  Complement C3 135,  complement C4 37 (both normal).  ASO less than 20, ANA negative. DD:  07/25/99 TD:  07/25/99 Job: [id number]

## 2010-11-03 NOTE — Consult Note (Signed)
[place]. [facility]  Patient:    [name], [name]                          MRN: [id number] Proc. Date: 11/10/00 Adm. Date:  [id number] Attending:  Cleatrice Burke                          Consultation Report  REFERRING PHYSICIAN:  Darci Needle, M.D.  REASON FOR CONSULTATION:  Severe single vessel coronary artery disease with unstable angina.  HISTORY OF PRESENT ILLNESS:  The patient is a 77 year old obese woman with multiple cardiac risk factors including hypertension and borderline diabetes who presented with vague abdominal pain.  She described this as a knot in her upper abdomen.  She had no nausea or vomiting.  She had no shortness of breath.  She initially denied chest pain.  She was seen by her primary physician and electrocautery showed T-wave abnormality inferiorly.  She was transferred to [facility] Emergency Department where CPK isoenzymes were negative.  She received a GI cocktail in the emergency room with gradual improvement.  She also received some sublingual nitroglycerin earlier that seemed to improve the discomfort.  She was admitted to rule out myocardial infarction.  CPK isoenzymes remained negative.  Troponin I was 0.03.  After admission she developed some chest pain that she described as being located along the lower left rib margin as well as beneath the left breast.  The pain was slightly worse with inspiration.  She was started on intravenous nitroglycerin without improvement.  Electrocardiogram showed no ischemic changes during the episodes of pain.  She underwent Cardiolite scan which reportedly showed focal ischemia in the anterior apical and lateral Baiz.  The rest study was completely normal.  Ejection fraction was 65%.  Due to repeated episodes of chest pain she was taken to the cardiac catheterization lab by Dr. Katrinka Blazing today which showed severe single-vessel disease.  The LAD had 75 to 80% midvessel  stenosis after the second diagonal branch.  The first diagonal branch had about 99% midvessel stenosis.  The second  diagonal branch had a 70% ostial stenosis.  The left circumflex and right coronary arteries had no disease.  Ejection fraction was greater than 70% with no mitral regurgitation.  Since the stenoses were in tortuous vessels and in the ostium of the  diagonal branch, it was felt that percutaneous intervention would be difficult.  Surgical consultation was therefore obtained.  PAST MEDICAL HISTORY:  Significant for diabetes which she describes as borderline.  She has a history of hypertension.  She has a history of dyslipidemia with elevated triglyceride and LDL.  She has a low HDL.  She has a history of acute renal failure secondary to allergic interstitial nephritis in January of 2001.  This was in the setting of dehydration with concomitant use of an ARB and probably secondary to allergic reaction from Cipro and/or Flagyl.  Her  renal function returned to normal.  She has a history of nonspecific colitis with chronic episodic diarrhea.  She is status post a right brain meningioma resection in October 1992 which was benign.  She is status post breast biopsy x 3 which was benign.  She has a history of rosacea. She has a history of kidney stones.  She is status post hysterectomy without bilateral salpingo-oophorectomy and she is status post tonsillectomy.  She has a history  of gastroesophageal reflux disease.  REVIEW OF SYSTEMS:  As noted in the cardiology admission history and physical.  FAMILY HISTORY:  Significant for mother dying at age 17 with diabetes and heart disease.  Her father died at age 28 with heart disease.  She has two brothers, both of whom had bypass surgery.  One sister died and another brother died of heart disease.  SOCIAL HISTORY:  Significant for being married for 40 years.  She has four children who are alive and well.  She does not smoke or use  alcohol.  PHYSICAL EXAMINATION:  VITAL SIGNS:  Her blood pressure is 120/65 and her pulse is 78 and regular. Respiratory rate is 18 and unlabored.  GENERAL:  She is an obese white female in no distress.  HEENT:  Normocephalic and atraumatic.  Pupils equal and reactive to light and accommodation.  Extraocular movements were intact.  Her throat is clear.  Neck exam shows normal carotid pulses bilaterally.  There are no bruits.  There is no adenopathy or thyromegaly.  HEART:  Cardiac exam shows regular rate and rhythm with no murmurs, gallops or rubs.  There is normal S1 and S2.  LUNGS:  Exam is clear.  ABDOMEN:  Abdominal exam shows active bowel sounds.  Her abdomen is soft and obese.  There are no palpable masses or organomegaly.  EXTREMITIES:  No peripheral edema.  Pedal pulses are palpable bilaterally. Skin is warm and dry.  NEUROLOGIC:  Alert and oriented x 3.  Motor and sensory exams are grossly normal.  IMPRESSION:  This patient has severe single vessel coronary artery disease involving the LAD and two diagonal branches.  The culprit vessel was most likely her first diagonal branch which has a 99% midvessel stenosis.  I agree that coronary artery bypass graft surgery is the best treatment for this patient followed by maximal cardiac risk factor reduction.  I discussed the operative procedure with her and her family including alternatives, benefits and risks including bleeding, possible blood transfusion, infection, stroke, myocardial infarction and death.  They understand and would like to proceed with surgery.  This has been scheduled for Nov 11, 2000. DD:  11/11/00 TD:  11/11/00 Job: [id number]

## 2011-01-26 ENCOUNTER — Other Ambulatory Visit: Payer: Self-pay | Admitting: Family Medicine

## 2011-01-31 ENCOUNTER — Ambulatory Visit
Admission: RE | Admit: 2011-01-31 | Discharge: 2011-01-31 | Disposition: A | Discharge status: Home / Self Care | Payer: Medicare Other | Source: Ambulatory Visit | Attending: Family Medicine | Admitting: Family Medicine

## 2011-03-20 LAB — COMPREHENSIVE METABOLIC PANEL
ALT: 20
CO2: 29
Calcium: 9.4
GFR calc non Af Amer: 57 — ABNORMAL LOW
Glucose, Bld: 88
Sodium: 138

## 2011-03-20 LAB — GLUCOSE, CAPILLARY
Glucose-Capillary: 101 — ABNORMAL HIGH
Glucose-Capillary: 111 — ABNORMAL HIGH
Glucose-Capillary: 118 — ABNORMAL HIGH
Glucose-Capillary: 130 — ABNORMAL HIGH
Glucose-Capillary: 85

## 2011-03-20 LAB — PROTIME-INR
INR: 1
INR: 1.4
INR: 1.5
Prothrombin Time: 13.4
Prothrombin Time: 19.1 — ABNORMAL HIGH

## 2011-03-20 LAB — BASIC METABOLIC PANEL
BUN: 11
BUN: 8
CO2: 30
Calcium: 8.1 — ABNORMAL LOW
Calcium: 8.3 — ABNORMAL LOW
Chloride: 98
Creatinine, Ser: 0.77
GFR calc Af Amer: >60
GFR calc non Af Amer: >60
GFR calc non Af Amer: >60
Glucose, Bld: 108 — ABNORMAL HIGH
Glucose, Bld: 116 — ABNORMAL HIGH
Potassium: 3.7

## 2011-03-20 LAB — CBC
Hemoglobin: 14.7
MCHC: 33.8
MCV: 86.9
RBC: 5.02

## 2011-03-20 LAB — HEMOGLOBIN AND HEMATOCRIT, BLOOD
HCT: 27 — ABNORMAL LOW
HCT: 37.4
Hemoglobin: 12.7

## 2011-03-20 LAB — URINE CULTURE

## 2011-03-20 LAB — DIFFERENTIAL
Basophils Absolute: 0
Basophils Relative: 1
Eosinophils Absolute: 0.1
Lymphs Abs: 2.1
Neutrophils Relative %: 56

## 2011-03-20 LAB — URINE MICROSCOPIC-ADD ON

## 2011-03-20 LAB — URINALYSIS, ROUTINE W REFLEX MICROSCOPIC
Nitrite: NEGATIVE
Specific Gravity, Urine: 1.009
pH: 7

## 2011-03-20 LAB — TYPE AND SCREEN: Antibody Screen: NEGATIVE

## 2011-03-23 LAB — URINALYSIS, ROUTINE W REFLEX MICROSCOPIC
Ketones, ur: NEGATIVE mg/dL
Nitrite: NEGATIVE
Protein, ur: 30 mg/dL — AB
Urobilinogen, UA: 0.2 mg/dL (ref 0.0–1.0)
pH: 7.5 (ref 5.0–8.0)

## 2011-03-23 LAB — GLUCOSE, CAPILLARY
Glucose-Capillary: 103 mg/dL — ABNORMAL HIGH (ref 70–99)
Glucose-Capillary: 120 mg/dL — ABNORMAL HIGH (ref 70–99)
Glucose-Capillary: 120 mg/dL — ABNORMAL HIGH (ref 70–99)
Glucose-Capillary: 126 mg/dL — ABNORMAL HIGH (ref 70–99)
Glucose-Capillary: 87 mg/dL (ref 70–99)
Glucose-Capillary: 96 mg/dL (ref 70–99)

## 2011-03-23 LAB — COMPREHENSIVE METABOLIC PANEL
BUN: 13 mg/dL (ref 6–23)
CO2: 25 mEq/L (ref 19–32)
Calcium: 9 mg/dL (ref 8.4–10.5)
GFR calc non Af Amer: >60 mL/min (ref >60)
Glucose, Bld: 113 mg/dL — ABNORMAL HIGH (ref 70–99)
Total Protein: 7 g/dL (ref 6.0–8.3)

## 2011-03-23 LAB — CBC
HCT: 38.3 % (ref 36.0–46.0)
Hemoglobin: 12.8 g/dL (ref 12.0–15.0)
Hemoglobin: 12.6 g/dL (ref 12.0–15.0)
MCHC: 33.5 g/dL (ref 30.0–36.0)
MCHC: 33.9 g/dL (ref 30.0–36.0)
MCV: 83.3 fL (ref 78.0–100.0)
RBC: 4.47 MIL/uL (ref 3.87–5.11)
RDW: 14.8 % (ref 11.5–15.5)
WBC: 7.3 10*3/uL (ref 4.0–10.5)

## 2011-03-23 LAB — BASIC METABOLIC PANEL
CO2: 29 mEq/L (ref 19–32)
Calcium: 8.5 mg/dL (ref 8.4–10.5)
Creatinine, Ser: 0.72 mg/dL (ref 0.4–1.2)
GFR calc Af Amer: >60 mL/min (ref >60)
GFR calc non Af Amer: >60 mL/min (ref >60)
Sodium: 138 mEq/L (ref 135–145)

## 2011-03-23 LAB — DIFFERENTIAL
Basophils Relative: 0 % (ref 0–1)
Basophils Relative: 0 % (ref 0–1)
Lymphocytes Relative: 31 % (ref 12–46)
Lymphs Abs: 2.1 10*3/uL (ref 0.7–4.0)
Lymphs Abs: 2.3 10*3/uL (ref 0.7–4.0)
Monocytes Absolute: 0.7 10*3/uL (ref 0.1–1.0)
Monocytes Relative: 8 % (ref 3–12)
Monocytes Relative: 10 % (ref 3–12)
Neutro Abs: 6.7 10*3/uL (ref 1.7–7.7)
Neutro Abs: 4 10*3/uL (ref 1.7–7.7)
Neutrophils Relative %: 68 % (ref 43–77)
Neutrophils Relative %: 55 % (ref 43–77)

## 2011-03-23 LAB — URINE CULTURE

## 2011-03-23 LAB — POCT CARDIAC MARKERS
CKMB, poc: <1 ng/mL — ABNORMAL LOW (ref 1.0–8.0)
Myoglobin, poc: 64.6 ng/mL (ref 12–200)

## 2011-06-27 DIAGNOSIS — I10 Essential (primary) hypertension: Secondary | ICD-10-CM | POA: Diagnosis not present

## 2011-06-27 DIAGNOSIS — R05 Cough: Secondary | ICD-10-CM | POA: Diagnosis not present

## 2011-07-10 DIAGNOSIS — E7800 Pure hypercholesterolemia, unspecified: Secondary | ICD-10-CM | POA: Diagnosis not present

## 2011-08-01 ENCOUNTER — Other Ambulatory Visit: Payer: Self-pay | Admitting: Family Medicine

## 2011-08-01 DIAGNOSIS — Z1231 Encounter for screening mammogram for malignant neoplasm of breast: Secondary | ICD-10-CM

## 2011-08-03 DIAGNOSIS — R609 Edema, unspecified: Secondary | ICD-10-CM | POA: Diagnosis not present

## 2011-08-03 DIAGNOSIS — J3489 Other specified disorders of nose and nasal sinuses: Secondary | ICD-10-CM | POA: Diagnosis not present

## 2011-08-04 ENCOUNTER — Encounter (HOSPITAL_COMMUNITY): Payer: Self-pay | Admitting: Emergency Medicine

## 2011-08-04 ENCOUNTER — Inpatient Hospital Stay (HOSPITAL_COMMUNITY)
Admission: EM | Admit: 2011-08-04 | Discharge: 2011-08-06 | DRG: 195 | Disposition: A | Discharge status: Home / Self Care | Payer: Medicare Other | Source: Ambulatory Visit | Attending: Internal Medicine | Admitting: Internal Medicine

## 2011-08-04 DIAGNOSIS — R079 Chest pain, unspecified: Secondary | ICD-10-CM | POA: Diagnosis not present

## 2011-08-04 DIAGNOSIS — D72829 Elevated white blood cell count, unspecified: Secondary | ICD-10-CM | POA: Diagnosis present

## 2011-08-04 DIAGNOSIS — J984 Other disorders of lung: Secondary | ICD-10-CM | POA: Diagnosis not present

## 2011-08-04 DIAGNOSIS — J189 Pneumonia, unspecified organism: Secondary | ICD-10-CM | POA: Diagnosis not present

## 2011-08-04 DIAGNOSIS — Z882 Allergy status to sulfonamides status: Secondary | ICD-10-CM

## 2011-08-04 DIAGNOSIS — Z7982 Long term (current) use of aspirin: Secondary | ICD-10-CM

## 2011-08-04 DIAGNOSIS — Z79899 Other long term (current) drug therapy: Secondary | ICD-10-CM

## 2011-08-04 DIAGNOSIS — Z880 Allergy status to penicillin: Secondary | ICD-10-CM

## 2011-08-04 DIAGNOSIS — E119 Type 2 diabetes mellitus without complications: Secondary | ICD-10-CM | POA: Diagnosis present

## 2011-08-04 DIAGNOSIS — Z888 Allergy status to other drugs, medicaments and biological substances status: Secondary | ICD-10-CM

## 2011-08-04 HISTORY — DX: Rosacea, unspecified: L71.9

## 2011-08-04 HISTORY — DX: Essential (primary) hypertension: I10

## 2011-08-04 HISTORY — DX: Atherosclerotic heart disease of native coronary artery without angina pectoris: I25.10

## 2011-08-04 HISTORY — DX: Other disorders of electrolyte and fluid balance, not elsewhere classified: E87.8

## 2011-08-04 NOTE — ED Notes (Signed)
Per EMS report . Seating in chair in her house started having left side chest tonight rating her pain pressure 3/10 and she called EMS and CP resolved by the time EMS got there.  Hx of resent sinus infection and treated with mult rounds of ABT.

## 2011-08-05 ENCOUNTER — Encounter (HOSPITAL_COMMUNITY): Payer: Self-pay | Admitting: Internal Medicine

## 2011-08-05 ENCOUNTER — Emergency Department (HOSPITAL_COMMUNITY): Payer: Medicare Other

## 2011-08-05 DIAGNOSIS — R079 Chest pain, unspecified: Secondary | ICD-10-CM | POA: Diagnosis not present

## 2011-08-05 DIAGNOSIS — D72829 Elevated white blood cell count, unspecified: Secondary | ICD-10-CM | POA: Diagnosis present

## 2011-08-05 DIAGNOSIS — E119 Type 2 diabetes mellitus without complications: Secondary | ICD-10-CM | POA: Diagnosis present

## 2011-08-05 DIAGNOSIS — Z888 Allergy status to other drugs, medicaments and biological substances status: Secondary | ICD-10-CM | POA: Diagnosis not present

## 2011-08-05 DIAGNOSIS — Z79899 Other long term (current) drug therapy: Secondary | ICD-10-CM | POA: Diagnosis not present

## 2011-08-05 DIAGNOSIS — D696 Thrombocytopenia, unspecified: Secondary | ICD-10-CM | POA: Diagnosis not present

## 2011-08-05 DIAGNOSIS — Z882 Allergy status to sulfonamides status: Secondary | ICD-10-CM | POA: Diagnosis not present

## 2011-08-05 DIAGNOSIS — Z880 Allergy status to penicillin: Secondary | ICD-10-CM | POA: Diagnosis not present

## 2011-08-05 DIAGNOSIS — Z7982 Long term (current) use of aspirin: Secondary | ICD-10-CM | POA: Diagnosis not present

## 2011-08-05 DIAGNOSIS — J984 Other disorders of lung: Secondary | ICD-10-CM | POA: Diagnosis not present

## 2011-08-05 DIAGNOSIS — J189 Pneumonia, unspecified organism: Secondary | ICD-10-CM | POA: Diagnosis present

## 2011-08-05 LAB — COMPREHENSIVE METABOLIC PANEL
ALT: 10 U/L (ref 0–35)
AST: 16 U/L (ref 0–37)
Alkaline Phosphatase: 87 U/L (ref 39–117)
CO2: 25 mEq/L (ref 19–32)
Calcium: 9.3 mg/dL (ref 8.4–10.5)
GFR calc Af Amer: 74 mL/min — ABNORMAL LOW (ref >90)
Glucose, Bld: 95 mg/dL (ref 70–99)
Potassium: 3.7 mEq/L (ref 3.5–5.1)
Sodium: 140 mEq/L (ref 135–145)
Total Protein: 7.3 g/dL (ref 6.0–8.3)

## 2011-08-05 LAB — CBC
HCT: 35.3 % — ABNORMAL LOW (ref 36.0–46.0)
HCT: 36.4 % (ref 36.0–46.0)
Hemoglobin: 11.6 g/dL — ABNORMAL LOW (ref 12.0–15.0)
Hemoglobin: 11.7 g/dL — ABNORMAL LOW (ref 12.0–15.0)
MCH: 25.2 pg — ABNORMAL LOW (ref 26.0–34.0)
MCHC: 32.1 g/dL (ref 30.0–36.0)
MCHC: 32.9 g/dL (ref 30.0–36.0)
RBC: 4.74 MIL/uL (ref 3.87–5.11)
RDW: 15.6 % — ABNORMAL HIGH (ref 11.5–15.5)
WBC: 9.5 10*3/uL (ref 4.0–10.5)

## 2011-08-05 LAB — LIPID PANEL
HDL: 65 mg/dL (ref >39)
LDL Cholesterol: 70 mg/dL (ref 0–99)
Total CHOL/HDL Ratio: 2.4 RATIO
VLDL: 22 mg/dL (ref 0–40)

## 2011-08-05 LAB — GLUCOSE, CAPILLARY: Glucose-Capillary: 89 mg/dL (ref 70–99)

## 2011-08-05 LAB — CARDIAC PANEL(CRET KIN+CKTOT+MB+TROPI)
CK, MB: 1.9 ng/mL (ref 0.3–4.0)
Relative Index: INVALID (ref 0.0–2.5)
Relative Index: INVALID (ref 0.0–2.5)
Relative Index: INVALID (ref 0.0–2.5)
Total CK: 63 U/L (ref 7–177)
Total CK: 65 U/L (ref 7–177)
Troponin I: <0.3 ng/mL (ref <0.30)

## 2011-08-05 LAB — POCT I-STAT, CHEM 8
BUN: 18 mg/dL (ref 6–23)
Chloride: 109 mEq/L (ref 96–112)
Creatinine, Ser: 0.8 mg/dL (ref 0.50–1.10)
Glucose, Bld: 113 mg/dL — ABNORMAL HIGH (ref 70–99)
Hemoglobin: 12.9 g/dL (ref 12.0–15.0)
Potassium: 4 mEq/L (ref 3.5–5.1)
Sodium: 141 mEq/L (ref 135–145)

## 2011-08-05 LAB — POCT I-STAT TROPONIN I

## 2011-08-05 MED ORDER — ENOXAPARIN SODIUM 40 MG/0.4ML ~~LOC~~ SOLN
40.0000 mg | SUBCUTANEOUS | Status: DC
  Administered 2011-08-05 – 2011-08-06 (×2): 40 mg via SUBCUTANEOUS
  Filled 2011-08-05 (×2): qty 0.4

## 2011-08-05 MED ORDER — MOXIFLOXACIN HCL IN NACL 400 MG/250ML IV SOLN
400.0000 mg | INTRAVENOUS | Status: DC
  Administered 2011-08-05 – 2011-08-06 (×2): 400 mg via INTRAVENOUS
  Filled 2011-08-05 (×3): qty 250

## 2011-08-05 MED ORDER — SODIUM CHLORIDE 0.9 % IV SOLN
INTRAVENOUS | Status: DC
  Administered 2011-08-05: 01:00:00 via INTRAVENOUS

## 2011-08-05 MED ORDER — FUROSEMIDE 20 MG PO TABS
20.0000 mg | ORAL_TABLET | Freq: Two times a day (BID) | ORAL | Status: DC
  Administered 2011-08-05 – 2011-08-06 (×3): 20 mg via ORAL
  Filled 2011-08-05 (×5): qty 1

## 2011-08-05 MED ORDER — SODIUM CHLORIDE 0.9 % IV SOLN: INTRAVENOUS | Status: DC

## 2011-08-05 MED ORDER — VITAMIN B-6 100 MG PO TABS: 100.0000 mg | ORAL_TABLET | ORAL | Status: DC

## 2011-08-05 MED ORDER — CHOLECALCIFEROL 10 MCG (400 UNIT) PO TABS
400.0000 [IU] | ORAL_TABLET | Freq: Every day | ORAL | Status: DC
  Administered 2011-08-05 – 2011-08-06 (×2): 400 [IU] via ORAL
  Filled 2011-08-05 (×2): qty 1

## 2011-08-05 MED ORDER — PANTOPRAZOLE SODIUM 40 MG PO TBEC
40.0000 mg | DELAYED_RELEASE_TABLET | Freq: Every day | ORAL | Status: DC
  Administered 2011-08-05 – 2011-08-06 (×2): 40 mg via ORAL
  Filled 2011-08-05 (×2): qty 1

## 2011-08-05 MED ORDER — ASPIRIN 81 MG PO CHEW
81.0000 mg | CHEWABLE_TABLET | Freq: Every day | ORAL | Status: DC
  Administered 2011-08-05 – 2011-08-06 (×2): 81 mg via ORAL
  Filled 2011-08-05 (×2): qty 1

## 2011-08-05 MED ORDER — DEXTROSE 5 % IV SOLN
500.0000 mg | Freq: Once | INTRAVENOUS | Status: AC
  Administered 2011-08-05: 500 mg via INTRAVENOUS
  Filled 2011-08-05: qty 500

## 2011-08-05 MED ORDER — VITAMIN C 500 MG PO TABS
500.0000 mg | ORAL_TABLET | Freq: Every day | ORAL | Status: DC
  Administered 2011-08-05 – 2011-08-06 (×2): 500 mg via ORAL
  Filled 2011-08-05 (×2): qty 1

## 2011-08-05 MED ORDER — AMLODIPINE BESYLATE 5 MG PO TABS
5.0000 mg | ORAL_TABLET | Freq: Two times a day (BID) | ORAL | Status: DC
  Administered 2011-08-05 – 2011-08-06 (×3): 5 mg via ORAL
  Filled 2011-08-05 (×5): qty 1

## 2011-08-05 MED ORDER — OLMESARTAN MEDOXOMIL 20 MG PO TABS
20.0000 mg | ORAL_TABLET | Freq: Two times a day (BID) | ORAL | Status: DC
  Administered 2011-08-05 – 2011-08-06 (×3): 20 mg via ORAL
  Filled 2011-08-05 (×5): qty 1

## 2011-08-05 MED ORDER — DEXTROSE 5 % IV SOLN
1.0000 g | Freq: Once | INTRAVENOUS | Status: AC
  Administered 2011-08-05: 1 g via INTRAVENOUS
  Filled 2011-08-05: qty 10

## 2011-08-05 MED ORDER — ASPIRIN 325 MG PO TABS: 325.0000 mg | ORAL_TABLET | Freq: Once | ORAL | Status: DC

## 2011-08-05 MED ORDER — FLUTICASONE PROPIONATE 50 MCG/ACT NA SUSP
2.0000 | Freq: Every day | NASAL | Status: DC
  Administered 2011-08-05: 2 via NASAL
  Filled 2011-08-05: qty 16

## 2011-08-05 MED ORDER — INSULIN ASPART 100 UNIT/ML ~~LOC~~ SOLN
0.0000 [IU] | Freq: Three times a day (TID) | SUBCUTANEOUS | Status: DC
  Filled 2011-08-05: qty 3

## 2011-08-05 MED ORDER — GUAIFENESIN ER 600 MG PO TB12
600.0000 mg | ORAL_TABLET | Freq: Two times a day (BID) | ORAL | Status: DC
  Administered 2011-08-05 – 2011-08-06 (×3): 600 mg via ORAL
  Filled 2011-08-05 (×4): qty 1

## 2011-08-05 MED ORDER — ROSUVASTATIN CALCIUM 5 MG PO TABS: 5.0000 mg | ORAL_TABLET | ORAL | Status: DC

## 2011-08-05 MED ORDER — ADULT MULTIVITAMIN W/MINERALS CH
1.0000 | ORAL_TABLET | Freq: Every day | ORAL | Status: DC
  Administered 2011-08-05 – 2011-08-06 (×2): 1 via ORAL
  Filled 2011-08-05 (×2): qty 1

## 2011-08-05 MED ORDER — ALBUTEROL SULFATE (5 MG/ML) 0.5% IN NEBU: 2.5000 mg | INHALATION_SOLUTION | RESPIRATORY_TRACT | Status: DC | PRN

## 2011-08-05 MED ORDER — CLONIDINE HCL 0.1 MG PO TABS
0.1000 mg | ORAL_TABLET | Freq: Two times a day (BID) | ORAL | Status: DC
Start: 2011-08-05 — End: 2011-08-06
  Administered 2011-08-05 – 2011-08-06 (×3): 0.1 mg via ORAL
  Filled 2011-08-05 (×5): qty 1

## 2011-08-05 NOTE — ED Notes (Signed)
MD in the room talking to pt and family

## 2011-08-05 NOTE — Progress Notes (Signed)
Patient admitted earlier today for failed OP treatment of CAP. Agree with avelox. If afebrile and leukocytosis improved, then DC home in am to continue 10 days of avelox. Will need followup cxr in 4-6 weeks to ensure resolution of her PNA. Home meds have been addressed.

## 2011-08-05 NOTE — ED Provider Notes (Signed)
History     CSN: [id number]  Arrival date & time 08/04/11  2312   First MD Initiated Contact with Patient 08/04/11 2342      Chief Complaint  Patient presents with  . Chest Pain    (Consider location/radiation/quality/duration/timing/severity/associated sxs/prior treatment) The history is provided by the patient.   onset tonight. Location left chest. Quality sharp and shooting pains. No radiation. Patient has remote history of CABG and is followed by cardiology. For the last month now she has been dealing with cough and congestion and persistent respiratory symptoms. She is completed a course of antibiotics and was recently started on cefuroxime by her primary care physician. She has multiple antibiotic allergies. Pain is now resolved. Mild shortness of breath persistent. Dry cough. No recent fever. She does have generalized weakness and fatigue over last few weeks. No abdominal pain, vomiting or diarrhea. Moderate in severity. Pain now 0/10. No known aggravating or alleviating factors.  Past Medical History  Diagnosis Date  . Hypertension   . Hyperchloremia   . Rosacea   . Diabetes mellitus     diet controlled  . Coronary artery disease     Past Surgical History  Procedure Date  . Abdominal hysterectomy   . Brain tumor excision 1993    meningioma  . Joint replacement 2009  . Coronary artery bypass graft 2002     3 vessel  . Rotator cuff repair 2010    Dr. Darrelyn Hillock    Family History  Problem Relation Age of Onset  . Diabetes Mother   . Heart disease Brother     History  Substance Use Topics  . Smoking status: Never Smoker   . Smokeless tobacco: Not on file  . Alcohol Use: No    OB History    Grav Para Term Preterm Abortions TAB SAB Ect Mult Living                  Review of Systems  Constitutional: Negative for fever and chills.  HENT: Positive for congestion. Negative for neck pain and neck stiffness.   Eyes: Negative for pain.  Respiratory: Positive for  cough. Negative for shortness of breath.   Cardiovascular: Positive for chest pain.  Gastrointestinal: Negative for abdominal pain.  Genitourinary: Negative for dysuria.  Musculoskeletal: Negative for back pain.  Skin: Negative for rash.  Neurological: Negative for headaches.  All other systems reviewed and are negative.    Allergies  Accutane; Ciprofloxacin; Dilantin; Gluten; Penicillins; and Sulfa antibiotics  Home Medications   Current Outpatient Rx  Name Route Sig Dispense Refill  . AMLODIPINE BESYLATE 5 MG PO TABS Oral Take 5 mg by mouth 2 (two) times daily.    . ASPIRIN 81 MG PO CHEW Oral Chew 81 mg by mouth daily.    Marland Kitchen CITRACAL PO Oral Take 1 tablet by mouth daily.    . CHOLECALCIFEROL 400 UNITS PO TABS Oral Take 400 Units by mouth daily.    Marland Kitchen CLONIDINE HCL 0.1 MG PO TABS Oral Take 0.1 mg by mouth 2 (two) times daily.    Marland Kitchen FLUTICASONE PROPIONATE 50 MCG/ACT NA SUSP Nasal Place 2 sprays into the nose daily.    . FUROSEMIDE 20 MG PO TABS Oral Take 20 mg by mouth 2 (two) times daily.    Marland Kitchen METRONIDAZOLE 0.75 % EX CREA Topical Apply 1 application topically 2 (two) times daily as needed. For rash on face    . ADULT MULTIVITAMIN W/MINERALS CH Oral Take 1 tablet by mouth  daily.    Marland Kitchen OLMESARTAN MEDOXOMIL 20 MG PO TABS Oral Take 20 mg by mouth 2 (two) times daily.    Marland Kitchen PANTOPRAZOLE SODIUM 40 MG PO TBEC Oral Take 40 mg by mouth daily.    Marland Kitchen VITAMIN B-6 100 MG PO TABS Oral Take 100 mg by mouth once a week. Takes on Thursday    . ROSUVASTATIN CALCIUM 5 MG PO TABS Oral Take 5 mg by mouth once a week. Takes on Friday    . TACROLIMUS 0.1 % EX OINT Topical Apply 1 application topically at bedtime as needed. For psoriasis on nose    . TOBRAMYCIN-DEXAMETHASONE 0.3-0.1 % OP OINT Both Eyes Place 1 application into both eyes See admin instructions. Apply to eye lids when needed for irritation -only 3-7 days per month    . VITAMIN B-12 500 MCG PO TABS Oral Take 500 mcg by mouth once a week. Take on  Thursday    . VITAMIN C 500 MG PO TABS Oral Take 500 mg by mouth daily.      BP 123/51  Pulse 77  Temp(Src) 98.8 F (37.1 C) (Oral)  Resp 14  SpO2 99%  Physical Exam  Constitutional: She is oriented to person, place, and time. She appears well-developed and well-nourished.  HENT:  Head: Normocephalic and atraumatic.       Congestion  Eyes: Conjunctivae and EOM are normal. Pupils are equal, round, and reactive to light.  Neck: Trachea normal. Neck supple. No thyromegaly present.  Cardiovascular: Normal rate, regular rhythm, S1 normal, S2 normal and normal pulses.     No systolic murmur is present   No diastolic murmur is present  Pulses:      Radial pulses are 2+ on the right side, and 2+ on the left side.  Pulmonary/Chest: Effort normal and breath sounds normal. She has no wheezes. She has no rhonchi. She has no rales. She exhibits no tenderness.  Abdominal: Soft. Normal appearance and bowel sounds are normal. There is no tenderness. There is no CVA tenderness and negative Murphy's sign.  Musculoskeletal:       BLE:s Calves nontender, no cords or erythema, negative Homans sign  Neurological: She is alert and oriented to person, place, and time. She has normal strength. No cranial nerve deficit or sensory deficit. GCS eye subscore is 4. GCS verbal subscore is 5. GCS motor subscore is 6.  Skin: Skin is warm and dry. No rash noted. She is not diaphoretic.  Psychiatric: Her speech is normal.       Cooperative and appropriate    ED Course  Procedures (including critical care time)  Labs Reviewed  CBC - Abnormal; Notable for the following:    Hemoglobin 11.7 (*)    MCV 76.8 (*)    MCH 24.7 (*)    All other components within normal limits  POCT I-STAT, CHEM 8 - Abnormal; Notable for the following:    Glucose, Bld 113 (*)    Calcium, Ion 1.05 (*)    All other components within normal limits  POCT I-STAT TROPONIN I  CARDIAC PANEL(CRET KIN+CKTOT+MB+TROPI)  CARDIAC PANEL(CRET  KIN+CKTOT+MB+TROPI)  CARDIAC PANEL(CRET KIN+CKTOT+MB+TROPI)   Dg Chest Portable 1 View  08/05/2011  *RADIOLOGY REPORT*  Clinical Data: Chest pain.  PORTABLE CHEST - 1 VIEW  Comparison: 05/21/2008  Findings: Single view of the chest was obtained.  There are increased densities in the right lower lung and concern for an infectious etiology in the right lower lung.  The upper lungs are  clear.  Stable blunting at the left costophrenic angle.  Heart size is stable.  Median sternotomy wires are present.  IMPRESSION: Increased densities in the right lower lung.  Concern for infection in the right lower lung and recommend follow-up to ensure resolution.  Original Report Authenticated By: Richarda Overlie, M.D.     1. Community acquired pneumonia       MDM   CP and cough with cardiac history.    Date: 08/05/2011  Rate: 79  Rhythm: normal sinus rhythm  QRS Axis: normal  Intervals: normal  ST/T Wave abnormalities: nonspecific ST/T changes  Conduction Disutrbances:none  Narrative Interpretation: low voltage QRS  Old EKG Reviewed: none available   Case discussed with triad hospitalist as above. Plan medical admission. IV antibiotics initiated       Sunnie Nielsen, MD 08/05/11 [phone]

## 2011-08-05 NOTE — H&P (Signed)
PCP:   Mickie Hillier, MD, MD    Chief Complaint:   Cough  HPI: [name] is a 78 y.o. female   has a past medical history of Hypertension; Hyperchloremia; Rosacea; and Diabetes mellitus.   Presented with  Feeling sick with recurant episoded of bronchitis since December. She had at least 3 coarses of antibiotics. Patient stated that Clarithromyacin caused rash on lower extremeties. Lately she has been on Cefuroxime since Friday the cough has improved. She still feels very congested with sinus drainage.  Today she developed chest dyscomfort that felt like elctrical tingling to her chest Servidio This was off and on. Now chest pain free, no SOB. No fever. Chest dyscomfort occurred at rest non exertional.   Review of Systems:    Pertinent positives include: Sore throat, nasal congestion, chest pain, rash on the legs  Constitutional:  No weight loss, night sweats, Fevers, chills, fatigue.  HEENT:  No headaches, Difficulty swallowing,Tooth/dental problems, No sneezing, itching, ear ache,  post nasal drip,  Cardio-vascular:  No , Orthopnea, PND, anasarca, dizziness, palpitations.no Bilateral lower extremity swelling  GI:  No heartburn, indigestion, abdominal pain, nausea, vomiting, diarrhea, change in bowel habits, loss of appetite, melena, blood in stool, hematoemesis Resp:  no shortness of breath at rest. No dyspnea on exertion, No excess mucus, no productive cough, No non-productive cough, No coughing up of blood.No change in color of mucus.No wheezing.No chest Odekirk deformity  Skin:  no  lesions.  GU:  no dysuria, change in color of urine, no urgency or frequency. No flank pain.  Musculoskeletal:  No joint pain or swelling. No decreased range of motion. No back pain.  Psych:  No change in mood or affect. No depression or anxiety. No memory loss.  Neuro: no localizing neurological complaints, no tingling, no weakness, no double vision, no gait abnormality, no slurred speech    Otherwise ROS are negative except for above, 10 systems were reviewed  Past Medical History: Past Medical History  Diagnosis Date  . Hypertension   . Hyperchloremia   . Rosacea   . Diabetes mellitus    History reviewed. No pertinent past surgical history.   Medications: Prior to Admission medications   Medication Sig Start Date End Date Taking? Authorizing Provider  amLODipine (NORVASC) 5 MG tablet Take 5 mg by mouth 2 (two) times daily.   Yes Historical Provider, MD  aspirin 81 MG chewable tablet Chew 81 mg by mouth daily.   Yes Historical Provider, MD  Calcium Citrate (CITRACAL PO) Take 1 tablet by mouth daily.   Yes Historical Provider, MD  cholecalciferol (VITAMIN D) 400 UNITS TABS Take 400 Units by mouth daily.   Yes Historical Provider, MD  cloNIDine (CATAPRES) 0.1 MG tablet Take 0.1 mg by mouth 2 (two) times daily.   Yes Historical Provider, MD  fluticasone (FLONASE) 50 MCG/ACT nasal spray Place 2 sprays into the nose daily.   Yes Historical Provider, MD  furosemide (LASIX) 20 MG tablet Take 20 mg by mouth 2 (two) times daily.   Yes Historical Provider, MD  metroNIDAZOLE (METROCREAM) 0.75 % cream Apply 1 application topically 2 (two) times daily as needed. For rash on face   Yes Historical Provider, MD  Multiple Vitamin (MULITIVITAMIN WITH MINERALS) TABS Take 1 tablet by mouth daily.   Yes Historical Provider, MD  olmesartan (BENICAR) 20 MG tablet Take 20 mg by mouth 2 (two) times daily.   Yes Historical Provider, MD  pantoprazole (PROTONIX) 40 MG tablet Take 40 mg  by mouth daily.   Yes Historical Provider, MD  pyridOXINE (VITAMIN B-6) 100 MG tablet Take 100 mg by mouth once a week. Takes on Thursday   Yes Historical Provider, MD  rosuvastatin (CRESTOR) 5 MG tablet Take 5 mg by mouth once a week. Takes on Friday   Yes Historical Provider, MD  tacrolimus (PROTOPIC) 0.1 % ointment Apply 1 application topically at bedtime as needed. For psoriasis on nose   Yes Historical  Provider, MD  tobramycin-dexamethasone (TOBRADEX) ophthalmic ointment Place 1 application into both eyes See admin instructions. Apply to eye lids when needed for irritation -only 3-7 days per month   Yes Historical Provider, MD  vitamin B-12 (CYANOCOBALAMIN) 500 MCG tablet Take 500 mcg by mouth once a week. Take on Thursday   Yes Historical Provider, MD  vitamin C (ASCORBIC ACID) 500 MG tablet Take 500 mg by mouth daily.   Yes Historical Provider, MD    Allergies:   Allergies  Allergen Reactions  . Accutane Other (See Comments)    Affects liver  . Ciprofloxacin Other (See Comments)    Kidney failure  . Dilantin Other (See Comments)    Affects liver  . Gluten Other (See Comments)    Celiac sprue  . Penicillins Other (See Comments)    From childhood  . Sulfa Antibiotics Rash    Social History:  Ambulatory with a cane Lives at home alone   does not have a smoking history on file. She does not have any smokeless tobacco history on file.   Family History: family history is not on file.    Physical Exam: Patient Vitals for the past 24 hrs:  BP Temp Temp src Pulse Resp SpO2  08/05/11 0415 - - - 77  14  99 %  08/05/11 0400 - - - 78  14  100 %  08/05/11 0345 - - - 79  16  100 %  08/05/11 0330 123/51 mmHg - - 77  14  100 %  08/05/11 0315 138/59 mmHg - - 74  14  99 %  08/05/11 0300 108/59 mmHg - - - - -  08/05/11 0245 142/59 mmHg - - 74  13  99 %  08/05/11 0230 141/65 mmHg - - 77  17  100 %  08/05/11 0215 134/58 mmHg - - 75  16  95 %  08/05/11 0200 134/60 mmHg - - 75  15  95 %  08/05/11 0145 134/55 mmHg - - 73  12  97 %  08/05/11 0130 134/60 mmHg - - 72  15  96 %  08/05/11 0115 138/63 mmHg - - 75  16  94 %  08/05/11 0100 131/61 mmHg - - 74  12  96 %  08/05/11 0045 123/61 mmHg - - 70  10  97 %  08/05/11 0030 127/53 mmHg - - 77  14  100 %  08/05/11 0015 137/67 mmHg - - 80  15  99 %  08/05/11 0000 - - - 78  17  99 %  08/04/11 2345 153/74 mmHg - - 77  16  99 %  08/04/11 2330  153/66 mmHg - - 83  21  99 %  08/04/11 2313 152/63 mmHg 98.8 F (37.1 C) Oral 70  14  99 %    1. General:  in No Acute distress 2. Psychological: Alert and Oriented 3. Head/ENT:    Dry Mucous Membranes  Head Non traumatic, neck supple                          Normal  Dentition 4. SKIN: normal Skin turgor,  Skin clean Dry and intact no rash 5. Heart: Regular rate and rhythm no Murmur, Rub or gallop 6. Lungs: no wheezes or crackles. Bronchial air sounds at the right base.   7. Abdomen: Soft, non-tender, Non distended, obese 8. Lower extremities: no clubbing, cyanosis, or trace edema bilateraly 9. Neurologically Grossly intact, moving all 4 extremities equally 10. MSK: Normal range of motion  body mass index is unknown because there is no height or weight on file.   Labs on Admission:   [facility] 08/05/11 0036  NA 141  K 4.0  CL 109  CO2 --  GLUCOSE 113*  BUN 18  CREATININE 0.80  CALCIUM --  MG --  PHOS --   No results found for this basename: AST:2,ALT:2,ALKPHOS:2,BILITOT:2,PROT:2,ALBUMIN:2 in the last 72 hours No results found for this basename: LIPASE:2,AMYLASE:2 in the last 72 hours  Basename 08/05/11 0036 08/05/11 0020  WBC -- 9.5  NEUTROABS -- --  HGB 12.9 11.7*  HCT 38.0 36.4  MCV -- 76.8*  PLT -- 270   No results found for this basename: CKTOTAL:3,CKMB:3,CKMBINDEX:3,TROPONINI:3 in the last 72 hours No results found for this basename: TSH,T4TOTAL,FREET3,T3FREE,THYROIDAB in the last 72 hours No results found for this basename: VITAMINB12:2,FOLATE:2,FERRITIN:2,TIBC:2,IRON:2,RETICCTPCT:2 in the last 72 hours Lab Results  Component Value Date   HGBA1C  Value: 5.6 (NOTE)   The ADA recommends the following therapeutic goal for glycemic   control related to Hgb A1C measurement:   Goal of Therapy:   < 7.0% Hgb A1C   Reference: American Diabetes Association: Clinical Practice   Recommendations 2008, Diabetes Care,  2008, 31:(Suppl 1). 07/21/2008     CrCl is unknown because there is no height on file for the current visit. ABG    Component Value Date/Time   TCO2 25 08/05/2011 0036     No results found for this basename: DDIMER     Other results:  I have pearsonaly reviewed this: ECG REPORT  Rate: 77  Rhythm: sinus ST&T Change: no ischemic changes, low voltage but unchanged from jan 2012    Cultures:    Component Value Date/Time   SDES STOOL 07/23/2008 2014   SDES STOOL 07/23/2008 2014   SPECREQUEST diarrhea augmentin IMMUNE:NORM 07/23/2008 2014   SPECREQUEST diarrhea augmentin IMMUNE:NORM 07/23/2008 2014   CULT NO GROWTH 07/21/2008 0865   REPTSTATUS 07/24/2008 FINAL 07/23/2008 2014   REPTSTATUS 07/24/2008 FINAL 07/23/2008 2014       Radiological Exams on Admission: Dg Chest Portable 1 View  08/05/2011  *RADIOLOGY REPORT*  Clinical Data: Chest pain.  PORTABLE CHEST - 1 VIEW  Comparison: 05/21/2008  Findings: Single view of the chest was obtained.  There are increased densities in the right lower lung and concern for an infectious etiology in the right lower lung.  The upper lungs are clear.  Stable blunting at the left costophrenic angle.  Heart size is stable.  Median sternotomy wires are present.  IMPRESSION: Increased densities in the right lower lung.  Concern for infection in the right lower lung and recommend follow-up to ensure resolution.  Original Report Authenticated By: Richarda Overlie, M.D.    Assessment/Plan  78 yo with CAP failed out patient treatment.   Present on Admission:  .Pneumonia -  - will admit for treatment of CAP will start  on appropriate antibiotic coverage. Patients repeorts renal failure with cipro and rash with clarithromycin would put her on Avelox as this should not be nephrotoxic. Patient was not sure if she also had bit of the rash with cipro or not.    Obtain sputum cultures, blood cultures if febrile or if decompensates.  Provide oxygen as needed.   .Chest pain - - given risk factors will admit, monitor  on telemetry, cycle cardiac enzymes, obtain serial ECG. Further risk stratify with lipid panel, hgA1C, obtain TSH. Make sure patient is on Aspirin. Further treatment based on the currently pending results.     Prophylaxis:  Lovenox, Protonix  CODE STATUS: DNR/DNI   Oberon Hehir 08/05/2011, 3:29 AM

## 2011-08-05 NOTE — ED Notes (Signed)
Portable xray at bedside.

## 2011-08-06 DIAGNOSIS — R079 Chest pain, unspecified: Secondary | ICD-10-CM | POA: Diagnosis not present

## 2011-08-06 DIAGNOSIS — D696 Thrombocytopenia, unspecified: Secondary | ICD-10-CM | POA: Diagnosis not present

## 2011-08-06 DIAGNOSIS — E119 Type 2 diabetes mellitus without complications: Secondary | ICD-10-CM | POA: Diagnosis not present

## 2011-08-06 LAB — BASIC METABOLIC PANEL
BUN: 12 mg/dL (ref 6–23)
Chloride: 107 mEq/L (ref 96–112)
GFR calc Af Amer: 73 mL/min — ABNORMAL LOW (ref >90)
Glucose, Bld: 95 mg/dL (ref 70–99)
Potassium: 3.5 mEq/L (ref 3.5–5.1)

## 2011-08-06 LAB — CBC
HCT: 33.8 % — ABNORMAL LOW (ref 36.0–46.0)
Hemoglobin: 11.1 g/dL — ABNORMAL LOW (ref 12.0–15.0)
MCH: 25.3 pg — ABNORMAL LOW (ref 26.0–34.0)
MCHC: 32.8 g/dL (ref 30.0–36.0)
RBC: 4.38 MIL/uL (ref 3.87–5.11)

## 2011-08-06 LAB — CARDIAC PANEL(CRET KIN+CKTOT+MB+TROPI)
Relative Index: INVALID (ref 0.0–2.5)
Total CK: 59 U/L (ref 7–177)
Total CK: 68 U/L (ref 7–177)
Troponin I: <0.3 ng/mL (ref <0.30)

## 2011-08-06 LAB — GLUCOSE, CAPILLARY: Glucose-Capillary: 96 mg/dL (ref 70–99)

## 2011-08-06 LAB — STREP PNEUMONIAE URINARY ANTIGEN: Strep Pneumo Urinary Antigen: NEGATIVE

## 2011-08-06 MED ORDER — MOXIFLOXACIN HCL 400 MG PO TABS: 400.0000 mg | ORAL_TABLET | Freq: Every day | ORAL | Status: AC

## 2011-08-06 NOTE — Progress Notes (Signed)
Utilization Review Completed.[name] T2/18/2013   

## 2011-08-06 NOTE — Discharge Summary (Signed)
Physician Discharge Summary  Patient ID: [name] MRN: [id number] DOB/AGE: June 17, 1929 78 y.o.  Admit date: 08/04/2011 Discharge date: 08/06/2011  Primary Care Physician:  Mickie Hillier, MD, MD   Discharge Diagnoses:    Principal Problem:  *Pneumonia Active Problems:  Chest pain  Diabetes mellitus    Medication List  As of 08/06/2011  2:05 PM   TAKE these medications         amLODipine 5 MG tablet   Commonly known as: NORVASC   Take 5 mg by mouth 2 (two) times daily.      aspirin 81 MG chewable tablet   Chew 81 mg by mouth daily.      cholecalciferol 400 UNITS Tabs   Commonly known as: VITAMIN D   Take 400 Units by mouth daily.      CITRACAL PO   Take 1 tablet by mouth daily.      cloNIDine 0.1 MG tablet   Commonly known as: CATAPRES   Take 0.1 mg by mouth 2 (two) times daily.      fluticasone 50 MCG/ACT nasal spray   Commonly known as: FLONASE   Place 2 sprays into the nose daily.      furosemide 20 MG tablet   Commonly known as: LASIX   Take 20 mg by mouth 2 (two) times daily.      metroNIDAZOLE 0.75 % cream   Commonly known as: METROCREAM   Apply 1 application topically 2 (two) times daily as needed. For rash on face      moxifloxacin 400 MG tablet   Commonly known as: AVELOX   Take 1 tablet (400 mg total) by mouth daily.      mulitivitamin with minerals Tabs   Take 1 tablet by mouth daily.      olmesartan 20 MG tablet   Commonly known as: BENICAR   Take 20 mg by mouth 2 (two) times daily.      pantoprazole 40 MG tablet   Commonly known as: PROTONIX   Take 40 mg by mouth daily.      pyridOXINE 100 MG tablet   Commonly known as: VITAMIN B-6   Take 100 mg by mouth once a week. Takes on Thursday      rosuvastatin 5 MG tablet   Commonly known as: CRESTOR   Take 5 mg by mouth once a week. Takes on Friday      tacrolimus 0.1 % ointment   Commonly known as: PROTOPIC   Apply 1 application topically at bedtime as needed. For psoriasis on  nose      tobramycin-dexamethasone ophthalmic ointment   Commonly known as: TOBRADEX   Place 1 application into both eyes See admin instructions. Apply to eye lids when needed for irritation -only 3-7 days per month      vitamin B-12 500 MCG tablet   Commonly known as: CYANOCOBALAMIN   Take 500 mcg by mouth once a week. Take on Thursday      vitamin C 500 MG tablet   Commonly known as: ASCORBIC ACID   Take 500 mg by mouth daily.             Disposition and Follow-up:  To be discharged home today in stable and improved condition. Followup with PCP in 4 weeks for repeat CXR to ensure complete resolution of PNA.   Consults:  None    Significant Diagnostic Studies:  Dg Chest Portable 1 View  08/05/2011  *RADIOLOGY REPORT*  Clinical Data: Chest pain.  PORTABLE CHEST - 1 VIEW  Comparison: 05/21/2008  Findings: Single view of the chest was obtained.  There are increased densities in the right lower lung and concern for an infectious etiology in the right lower lung.  The upper lungs are clear.  Stable blunting at the left costophrenic angle.  Heart size is stable.  Median sternotomy wires are present.  IMPRESSION: Increased densities in the right lower lung.  Concern for infection in the right lower lung and recommend follow-up to ensure resolution.  Original Report Authenticated By: Richarda Overlie, M.D.    Brief H and P: For complete details please refer to admission H and P, but in brief Patient is a 78 y.o. female who has a past medical history of Hypertension; Hyperchloremia; Rosacea; and Diabetes mellitus, who presented with eeling sick with recurant episoded of bronchitis since December. She had at least 3 coarses of antibiotics. Patient stated that Clarithromyacin caused rash on lower extremeties. Lately she has been on Cefuroxime since Friday the cough has improved. She still feels very congested with sinus drainage.  Today she developed chest dyscomfort that felt like elctrical tingling  to her chest [place]erby This was off and on. Now chest pain free, no SOB. No fever. Chest dyscomfort occurred at rest non exertional     Hospital Course:  Principal Problem:  *Pneumonia Active Problems:  Chest pain  Diabetes mellitus   #CAP: all culture data is negative to date. Afebrile, no leukocytosis. Avelox for 8 days to complete 10 day course. Repeat CXR in 4-6 weeks.  All other chronic medical issues are stable. Home medications have not been altered.  Time spent on Discharge: Greater than 30 minutes.  SignedChaya Jan [facility] Pager: [phone] 08/06/2011, 2:05 PM

## 2011-08-06 NOTE — Progress Notes (Signed)
2:45 PM - pt d/c home with instructions, r/x, and f/u appointments. Pt and dtr verbalized understanding of instructions, home with family.  Ninetta Lights RN  08/06/2011

## 2011-09-04 DIAGNOSIS — M4120 Other idiopathic scoliosis, site unspecified: Secondary | ICD-10-CM | POA: Diagnosis not present

## 2011-09-04 DIAGNOSIS — M8448XA Pathological fracture, other site, initial encounter for fracture: Secondary | ICD-10-CM | POA: Diagnosis not present

## 2011-09-05 DIAGNOSIS — I2510 Atherosclerotic heart disease of native coronary artery without angina pectoris: Secondary | ICD-10-CM | POA: Diagnosis not present

## 2011-09-05 DIAGNOSIS — E7800 Pure hypercholesterolemia, unspecified: Secondary | ICD-10-CM | POA: Diagnosis not present

## 2011-09-05 DIAGNOSIS — I119 Hypertensive heart disease without heart failure: Secondary | ICD-10-CM | POA: Diagnosis not present

## 2011-09-06 ENCOUNTER — Ambulatory Visit
Admission: RE | Admit: 2011-09-06 | Discharge: 2011-09-06 | Disposition: A | Discharge status: Home / Self Care | Payer: Medicare Other | Source: Ambulatory Visit | Attending: Family Medicine | Admitting: Family Medicine

## 2011-09-06 DIAGNOSIS — Z1231 Encounter for screening mammogram for malignant neoplasm of breast: Secondary | ICD-10-CM

## 2011-09-24 DIAGNOSIS — I10 Essential (primary) hypertension: Secondary | ICD-10-CM | POA: Diagnosis not present

## 2011-09-24 DIAGNOSIS — J309 Allergic rhinitis, unspecified: Secondary | ICD-10-CM | POA: Diagnosis not present

## 2011-09-24 DIAGNOSIS — R5383 Other fatigue: Secondary | ICD-10-CM | POA: Diagnosis not present

## 2011-09-24 DIAGNOSIS — K219 Gastro-esophageal reflux disease without esophagitis: Secondary | ICD-10-CM | POA: Diagnosis not present

## 2011-09-24 DIAGNOSIS — R5381 Other malaise: Secondary | ICD-10-CM | POA: Diagnosis not present

## 2011-10-01 DIAGNOSIS — E7800 Pure hypercholesterolemia, unspecified: Secondary | ICD-10-CM | POA: Diagnosis not present

## 2011-10-01 DIAGNOSIS — R609 Edema, unspecified: Secondary | ICD-10-CM | POA: Diagnosis not present

## 2011-10-01 DIAGNOSIS — Z961 Presence of intraocular lens: Secondary | ICD-10-CM | POA: Diagnosis not present

## 2011-10-01 DIAGNOSIS — H01009 Unspecified blepharitis unspecified eye, unspecified eyelid: Secondary | ICD-10-CM | POA: Diagnosis not present

## 2011-10-01 DIAGNOSIS — L719 Rosacea, unspecified: Secondary | ICD-10-CM | POA: Diagnosis not present

## 2011-10-01 DIAGNOSIS — Z79899 Other long term (current) drug therapy: Secondary | ICD-10-CM | POA: Diagnosis not present

## 2011-10-01 DIAGNOSIS — R5381 Other malaise: Secondary | ICD-10-CM | POA: Diagnosis not present

## 2011-10-01 DIAGNOSIS — H26499 Other secondary cataract, unspecified eye: Secondary | ICD-10-CM | POA: Diagnosis not present

## 2011-10-01 DIAGNOSIS — E119 Type 2 diabetes mellitus without complications: Secondary | ICD-10-CM | POA: Diagnosis not present

## 2011-10-01 DIAGNOSIS — H40019 Open angle with borderline findings, low risk, unspecified eye: Secondary | ICD-10-CM | POA: Diagnosis not present

## 2011-10-01 DIAGNOSIS — I10 Essential (primary) hypertension: Secondary | ICD-10-CM | POA: Diagnosis not present

## 2011-10-02 DIAGNOSIS — M1710 Unilateral primary osteoarthritis, unspecified knee: Secondary | ICD-10-CM | POA: Diagnosis not present

## 2011-10-06 DIAGNOSIS — R599 Enlarged lymph nodes, unspecified: Secondary | ICD-10-CM | POA: Diagnosis not present

## 2011-10-08 DIAGNOSIS — J029 Acute pharyngitis, unspecified: Secondary | ICD-10-CM | POA: Diagnosis not present

## 2011-10-08 DIAGNOSIS — J329 Chronic sinusitis, unspecified: Secondary | ICD-10-CM | POA: Diagnosis not present

## 2011-10-24 ENCOUNTER — Other Ambulatory Visit: Payer: Self-pay | Admitting: Family Medicine

## 2011-10-24 ENCOUNTER — Ambulatory Visit
Admission: RE | Admit: 2011-10-24 | Discharge: 2011-10-24 | Disposition: A | Discharge status: Home / Self Care | Payer: Medicare Other | Source: Ambulatory Visit | Attending: Family Medicine | Admitting: Family Medicine

## 2011-10-24 DIAGNOSIS — J329 Chronic sinusitis, unspecified: Secondary | ICD-10-CM | POA: Diagnosis not present

## 2011-10-24 DIAGNOSIS — I10 Essential (primary) hypertension: Secondary | ICD-10-CM | POA: Diagnosis not present

## 2011-10-24 DIAGNOSIS — J320 Chronic maxillary sinusitis: Secondary | ICD-10-CM | POA: Diagnosis not present

## 2011-10-24 DIAGNOSIS — R0981 Nasal congestion: Secondary | ICD-10-CM

## 2011-10-24 DIAGNOSIS — J3489 Other specified disorders of nose and nasal sinuses: Secondary | ICD-10-CM | POA: Diagnosis not present

## 2011-10-24 DIAGNOSIS — J321 Chronic frontal sinusitis: Secondary | ICD-10-CM | POA: Diagnosis not present

## 2011-10-25 DIAGNOSIS — L821 Other seborrheic keratosis: Secondary | ICD-10-CM | POA: Diagnosis not present

## 2011-10-25 DIAGNOSIS — L719 Rosacea, unspecified: Secondary | ICD-10-CM | POA: Diagnosis not present

## 2011-10-25 DIAGNOSIS — L299 Pruritus, unspecified: Secondary | ICD-10-CM | POA: Diagnosis not present

## 2011-10-25 DIAGNOSIS — L259 Unspecified contact dermatitis, unspecified cause: Secondary | ICD-10-CM | POA: Diagnosis not present

## 2011-10-30 DIAGNOSIS — J342 Deviated nasal septum: Secondary | ICD-10-CM | POA: Diagnosis not present

## 2011-10-30 DIAGNOSIS — K117 Disturbances of salivary secretion: Secondary | ICD-10-CM | POA: Diagnosis not present

## 2011-10-30 DIAGNOSIS — J301 Allergic rhinitis due to pollen: Secondary | ICD-10-CM | POA: Diagnosis not present

## 2011-10-30 DIAGNOSIS — J328 Other chronic sinusitis: Secondary | ICD-10-CM | POA: Diagnosis not present

## 2011-11-29 DIAGNOSIS — I10 Essential (primary) hypertension: Secondary | ICD-10-CM | POA: Diagnosis not present

## 2011-12-18 DIAGNOSIS — J301 Allergic rhinitis due to pollen: Secondary | ICD-10-CM | POA: Diagnosis not present

## 2011-12-18 DIAGNOSIS — J328 Other chronic sinusitis: Secondary | ICD-10-CM | POA: Diagnosis not present

## 2011-12-18 DIAGNOSIS — J342 Deviated nasal septum: Secondary | ICD-10-CM | POA: Diagnosis not present

## 2011-12-26 DIAGNOSIS — M1710 Unilateral primary osteoarthritis, unspecified knee: Secondary | ICD-10-CM | POA: Diagnosis not present

## 2011-12-26 DIAGNOSIS — IMO0002 Reserved for concepts with insufficient information to code with codable children: Secondary | ICD-10-CM | POA: Diagnosis not present

## 2012-01-16 DIAGNOSIS — M48061 Spinal stenosis, lumbar region without neurogenic claudication: Secondary | ICD-10-CM | POA: Diagnosis not present

## 2012-01-21 ENCOUNTER — Other Ambulatory Visit: Payer: Self-pay | Admitting: Orthopedic Surgery

## 2012-01-21 DIAGNOSIS — M48061 Spinal stenosis, lumbar region without neurogenic claudication: Secondary | ICD-10-CM

## 2012-01-23 ENCOUNTER — Ambulatory Visit
Admission: RE | Admit: 2012-01-23 | Discharge: 2012-01-23 | Disposition: A | Discharge status: Home / Self Care | Payer: Medicare Other | Source: Ambulatory Visit | Attending: Orthopedic Surgery | Admitting: Orthopedic Surgery

## 2012-01-23 VITALS — BP 124/56 | HR 72

## 2012-01-23 DIAGNOSIS — M48061 Spinal stenosis, lumbar region without neurogenic claudication: Secondary | ICD-10-CM

## 2012-01-23 DIAGNOSIS — M5137 Other intervertebral disc degeneration, lumbosacral region: Secondary | ICD-10-CM | POA: Diagnosis not present

## 2012-01-23 DIAGNOSIS — M5126 Other intervertebral disc displacement, lumbar region: Secondary | ICD-10-CM | POA: Diagnosis not present

## 2012-01-23 DIAGNOSIS — J189 Pneumonia, unspecified organism: Secondary | ICD-10-CM

## 2012-01-23 DIAGNOSIS — M47817 Spondylosis without myelopathy or radiculopathy, lumbosacral region: Secondary | ICD-10-CM | POA: Diagnosis not present

## 2012-01-23 MED ORDER — DIAZEPAM 5 MG PO TABS: 5.0000 mg | ORAL_TABLET | Freq: Once | ORAL | Status: DC

## 2012-01-23 MED ORDER — ACETAMINOPHEN 500 MG PO TABS
1000.0000 mg | ORAL_TABLET | Freq: Once | ORAL | Status: AC
  Administered 2012-01-23: 1000 mg via ORAL

## 2012-01-23 MED ORDER — IOHEXOL 180 MG/ML  SOLN
15.0000 mL | Freq: Once | INTRAMUSCULAR | Status: AC | PRN
  Administered 2012-01-23: 15 mL via INTRATHECAL

## 2012-01-29 DIAGNOSIS — M48061 Spinal stenosis, lumbar region without neurogenic claudication: Secondary | ICD-10-CM | POA: Diagnosis not present

## 2012-02-27 DIAGNOSIS — M48061 Spinal stenosis, lumbar region without neurogenic claudication: Secondary | ICD-10-CM | POA: Diagnosis not present

## 2012-03-12 DIAGNOSIS — M1990 Unspecified osteoarthritis, unspecified site: Secondary | ICD-10-CM | POA: Diagnosis not present

## 2012-03-12 DIAGNOSIS — M48061 Spinal stenosis, lumbar region without neurogenic claudication: Secondary | ICD-10-CM | POA: Diagnosis not present

## 2012-03-13 ENCOUNTER — Other Ambulatory Visit (HOSPITAL_COMMUNITY): Payer: Self-pay | Admitting: Orthopedic Surgery

## 2012-03-13 DIAGNOSIS — M545 Low back pain, unspecified: Secondary | ICD-10-CM

## 2012-03-18 DIAGNOSIS — M810 Age-related osteoporosis without current pathological fracture: Secondary | ICD-10-CM | POA: Diagnosis not present

## 2012-03-18 DIAGNOSIS — Z0000 Encounter for general adult medical examination without abnormal findings: Secondary | ICD-10-CM | POA: Diagnosis not present

## 2012-03-18 DIAGNOSIS — Z1331 Encounter for screening for depression: Secondary | ICD-10-CM | POA: Diagnosis not present

## 2012-03-18 DIAGNOSIS — E118 Type 2 diabetes mellitus with unspecified complications: Secondary | ICD-10-CM | POA: Diagnosis not present

## 2012-03-18 DIAGNOSIS — R82998 Other abnormal findings in urine: Secondary | ICD-10-CM | POA: Diagnosis not present

## 2012-03-18 DIAGNOSIS — I2510 Atherosclerotic heart disease of native coronary artery without angina pectoris: Secondary | ICD-10-CM | POA: Diagnosis not present

## 2012-03-18 DIAGNOSIS — I10 Essential (primary) hypertension: Secondary | ICD-10-CM | POA: Diagnosis not present

## 2012-03-18 DIAGNOSIS — Z23 Encounter for immunization: Secondary | ICD-10-CM | POA: Diagnosis not present

## 2012-03-18 DIAGNOSIS — G609 Hereditary and idiopathic neuropathy, unspecified: Secondary | ICD-10-CM | POA: Diagnosis not present

## 2012-03-18 DIAGNOSIS — K219 Gastro-esophageal reflux disease without esophagitis: Secondary | ICD-10-CM | POA: Diagnosis not present

## 2012-03-18 DIAGNOSIS — K900 Celiac disease: Secondary | ICD-10-CM | POA: Diagnosis not present

## 2012-03-26 ENCOUNTER — Encounter (HOSPITAL_COMMUNITY)
Admission: RE | Admit: 2012-03-26 | Discharge: 2012-03-26 | Disposition: A | Discharge status: Home / Self Care | Payer: Medicare Other | Source: Ambulatory Visit | Attending: Orthopedic Surgery | Admitting: Orthopedic Surgery

## 2012-03-26 ENCOUNTER — Encounter (HOSPITAL_COMMUNITY): Payer: Self-pay

## 2012-03-26 DIAGNOSIS — M4120 Other idiopathic scoliosis, site unspecified: Secondary | ICD-10-CM | POA: Insufficient documentation

## 2012-03-26 DIAGNOSIS — M545 Low back pain, unspecified: Secondary | ICD-10-CM | POA: Diagnosis not present

## 2012-03-26 MED ORDER — TECHNETIUM TC 99M MEDRONATE IV KIT
24.0000 | PACK | Freq: Once | INTRAVENOUS | Status: AC | PRN
  Administered 2012-03-26: 24 via INTRAVENOUS

## 2012-04-03 DIAGNOSIS — E7800 Pure hypercholesterolemia, unspecified: Secondary | ICD-10-CM | POA: Diagnosis not present

## 2012-04-07 DIAGNOSIS — M1990 Unspecified osteoarthritis, unspecified site: Secondary | ICD-10-CM | POA: Diagnosis not present

## 2012-04-07 DIAGNOSIS — M4120 Other idiopathic scoliosis, site unspecified: Secondary | ICD-10-CM | POA: Diagnosis not present

## 2012-04-07 DIAGNOSIS — M48061 Spinal stenosis, lumbar region without neurogenic claudication: Secondary | ICD-10-CM | POA: Diagnosis not present

## 2012-04-11 DIAGNOSIS — E7800 Pure hypercholesterolemia, unspecified: Secondary | ICD-10-CM | POA: Diagnosis not present

## 2012-04-11 DIAGNOSIS — Z79899 Other long term (current) drug therapy: Secondary | ICD-10-CM | POA: Diagnosis not present

## 2012-04-11 DIAGNOSIS — E669 Obesity, unspecified: Secondary | ICD-10-CM | POA: Diagnosis not present

## 2012-04-11 DIAGNOSIS — I2510 Atherosclerotic heart disease of native coronary artery without angina pectoris: Secondary | ICD-10-CM | POA: Diagnosis not present

## 2012-04-11 DIAGNOSIS — I119 Hypertensive heart disease without heart failure: Secondary | ICD-10-CM | POA: Diagnosis not present

## 2012-05-19 DIAGNOSIS — H40019 Open angle with borderline findings, low risk, unspecified eye: Secondary | ICD-10-CM | POA: Diagnosis not present

## 2012-05-19 DIAGNOSIS — H538 Other visual disturbances: Secondary | ICD-10-CM | POA: Diagnosis not present

## 2012-05-19 DIAGNOSIS — H04129 Dry eye syndrome of unspecified lacrimal gland: Secondary | ICD-10-CM | POA: Diagnosis not present

## 2012-06-06 DIAGNOSIS — I10 Essential (primary) hypertension: Secondary | ICD-10-CM | POA: Diagnosis not present

## 2012-07-29 ENCOUNTER — Other Ambulatory Visit: Payer: Self-pay | Admitting: Family Medicine

## 2012-07-29 DIAGNOSIS — Z1231 Encounter for screening mammogram for malignant neoplasm of breast: Secondary | ICD-10-CM

## 2012-09-09 ENCOUNTER — Ambulatory Visit
Admission: RE | Admit: 2012-09-09 | Discharge: 2012-09-09 | Disposition: A | Discharge status: Home / Self Care | Payer: Medicare Other | Source: Ambulatory Visit | Attending: Family Medicine | Admitting: Family Medicine

## 2012-09-09 DIAGNOSIS — Z1231 Encounter for screening mammogram for malignant neoplasm of breast: Secondary | ICD-10-CM

## 2012-09-25 DIAGNOSIS — H26499 Other secondary cataract, unspecified eye: Secondary | ICD-10-CM | POA: Diagnosis not present

## 2012-09-25 DIAGNOSIS — H04129 Dry eye syndrome of unspecified lacrimal gland: Secondary | ICD-10-CM | POA: Diagnosis not present

## 2012-09-25 DIAGNOSIS — Z961 Presence of intraocular lens: Secondary | ICD-10-CM | POA: Diagnosis not present

## 2012-09-25 DIAGNOSIS — E119 Type 2 diabetes mellitus without complications: Secondary | ICD-10-CM | POA: Diagnosis not present

## 2012-09-25 DIAGNOSIS — H40019 Open angle with borderline findings, low risk, unspecified eye: Secondary | ICD-10-CM | POA: Diagnosis not present

## 2012-10-31 DIAGNOSIS — M25569 Pain in unspecified knee: Secondary | ICD-10-CM | POA: Diagnosis not present

## 2012-10-31 DIAGNOSIS — M25579 Pain in unspecified ankle and joints of unspecified foot: Secondary | ICD-10-CM | POA: Diagnosis not present

## 2012-11-03 DIAGNOSIS — Z79899 Other long term (current) drug therapy: Secondary | ICD-10-CM | POA: Diagnosis not present

## 2012-11-03 DIAGNOSIS — E7800 Pure hypercholesterolemia, unspecified: Secondary | ICD-10-CM | POA: Diagnosis not present

## 2012-11-03 DIAGNOSIS — I2510 Atherosclerotic heart disease of native coronary artery without angina pectoris: Secondary | ICD-10-CM | POA: Diagnosis not present

## 2012-11-03 DIAGNOSIS — I119 Hypertensive heart disease without heart failure: Secondary | ICD-10-CM | POA: Diagnosis not present

## 2012-11-14 DIAGNOSIS — M659 Synovitis and tenosynovitis, unspecified: Secondary | ICD-10-CM | POA: Diagnosis not present

## 2012-11-14 DIAGNOSIS — M25579 Pain in unspecified ankle and joints of unspecified foot: Secondary | ICD-10-CM | POA: Diagnosis not present

## 2012-11-18 DIAGNOSIS — I129 Hypertensive chronic kidney disease with stage 1 through stage 4 chronic kidney disease, or unspecified chronic kidney disease: Secondary | ICD-10-CM | POA: Diagnosis not present

## 2012-11-18 DIAGNOSIS — I10 Essential (primary) hypertension: Secondary | ICD-10-CM | POA: Diagnosis not present

## 2012-11-18 DIAGNOSIS — N182 Chronic kidney disease, stage 2 (mild): Secondary | ICD-10-CM | POA: Diagnosis not present

## 2012-11-19 DIAGNOSIS — L819 Disorder of pigmentation, unspecified: Secondary | ICD-10-CM | POA: Diagnosis not present

## 2012-11-19 DIAGNOSIS — D239 Other benign neoplasm of skin, unspecified: Secondary | ICD-10-CM | POA: Diagnosis not present

## 2012-11-19 DIAGNOSIS — L821 Other seborrheic keratosis: Secondary | ICD-10-CM | POA: Diagnosis not present

## 2012-11-19 DIAGNOSIS — C44319 Basal cell carcinoma of skin of other parts of face: Secondary | ICD-10-CM | POA: Diagnosis not present

## 2012-11-19 DIAGNOSIS — Z85828 Personal history of other malignant neoplasm of skin: Secondary | ICD-10-CM | POA: Diagnosis not present

## 2012-11-19 DIAGNOSIS — L723 Sebaceous cyst: Secondary | ICD-10-CM | POA: Diagnosis not present

## 2012-11-19 DIAGNOSIS — L719 Rosacea, unspecified: Secondary | ICD-10-CM | POA: Diagnosis not present

## 2012-11-19 DIAGNOSIS — D1801 Hemangioma of skin and subcutaneous tissue: Secondary | ICD-10-CM | POA: Diagnosis not present

## 2012-12-15 DIAGNOSIS — C44319 Basal cell carcinoma of skin of other parts of face: Secondary | ICD-10-CM | POA: Diagnosis not present

## 2012-12-15 DIAGNOSIS — Z85828 Personal history of other malignant neoplasm of skin: Secondary | ICD-10-CM | POA: Diagnosis not present

## 2012-12-24 ENCOUNTER — Other Ambulatory Visit: Payer: Self-pay | Admitting: Family Medicine

## 2012-12-24 ENCOUNTER — Ambulatory Visit
Admission: RE | Admit: 2012-12-24 | Discharge: 2012-12-24 | Disposition: A | Discharge status: Home / Self Care | Payer: Medicare Other | Source: Ambulatory Visit | Attending: Family Medicine | Admitting: Family Medicine

## 2012-12-24 DIAGNOSIS — R109 Unspecified abdominal pain: Secondary | ICD-10-CM | POA: Diagnosis not present

## 2012-12-24 DIAGNOSIS — K5730 Diverticulosis of large intestine without perforation or abscess without bleeding: Secondary | ICD-10-CM | POA: Diagnosis not present

## 2012-12-24 DIAGNOSIS — R1031 Right lower quadrant pain: Secondary | ICD-10-CM

## 2012-12-24 MED ORDER — IOHEXOL 300 MG/ML  SOLN
30.0000 mL | Freq: Once | INTRAMUSCULAR | Status: AC | PRN
  Administered 2012-12-24: 30 mL via ORAL

## 2012-12-24 MED ORDER — IOHEXOL 300 MG/ML  SOLN
100.0000 mL | Freq: Once | INTRAMUSCULAR | Status: AC | PRN
  Administered 2012-12-24: 100 mL via INTRAVENOUS

## 2012-12-30 DIAGNOSIS — N3090 Cystitis, unspecified without hematuria: Secondary | ICD-10-CM | POA: Diagnosis not present

## 2012-12-30 DIAGNOSIS — Z602 Problems related to living alone: Secondary | ICD-10-CM | POA: Diagnosis not present

## 2012-12-30 DIAGNOSIS — Z452 Encounter for adjustment and management of vascular access device: Secondary | ICD-10-CM | POA: Diagnosis not present

## 2013-01-08 DIAGNOSIS — N390 Urinary tract infection, site not specified: Secondary | ICD-10-CM | POA: Diagnosis not present

## 2013-01-14 ENCOUNTER — Telehealth: Payer: Self-pay | Admitting: Neurology

## 2013-01-14 NOTE — Telephone Encounter (Signed)
Patient is calling having leg pain. She is not sure what she needs to do. Please advise.

## 2013-01-14 NOTE — Telephone Encounter (Signed)
I called patient. The patient has had some persistent problems with a squeezing sensation in the legs from time to time. Within the last 2 days, the patient had an occasional twinge of pain from the knee on the left upper the left hip. This occurs only when standing. The patient wants to have a revisit. We will try to get something set up. The patient has not been seen through this office since 2012.

## 2013-01-23 ENCOUNTER — Encounter: Payer: Self-pay | Admitting: Neurology

## 2013-01-23 ENCOUNTER — Ambulatory Visit (INDEPENDENT_AMBULATORY_CARE_PROVIDER_SITE_OTHER): Payer: Medicare Other | Admitting: Neurology

## 2013-01-23 VITALS — BP 180/78 | HR 96 | Ht 63.0 in | Wt 180.0 lb

## 2013-01-23 DIAGNOSIS — M79609 Pain in unspecified limb: Secondary | ICD-10-CM | POA: Diagnosis not present

## 2013-01-23 DIAGNOSIS — I679 Cerebrovascular disease, unspecified: Secondary | ICD-10-CM

## 2013-01-23 DIAGNOSIS — R42 Dizziness and giddiness: Secondary | ICD-10-CM

## 2013-01-23 DIAGNOSIS — R269 Unspecified abnormalities of gait and mobility: Secondary | ICD-10-CM

## 2013-01-23 NOTE — Progress Notes (Signed)
Reason for visit: Leg pain  [name] is an 79 y.o. female  History of present illness:  [name] is an 79 year old right-handed white female with a history of diabetes that is diet controlled. The patient indicates that she has had some problems with a squeezing sensation in both legs that has been present for least 2 years. The squeezing sensation is around the calf muscles primarily. The patient denies any significant numbness or burning or stinging sensations in the feet. Within the last several weeks, the patient claims that she began having left leg discomfort that goes up into the left thigh. The patient however, apparently had a lumbosacral myelogram with CT to follow approximately one year ago. The reason for this study was because of left leg discomfort. The patient feels that there may be some slight weakness of the left leg. The patient uses a cane for ambulation, and she indicates that there have not been any falls. The patient has recently been treated for a bladder infection, but otherwise she does not note any troubles controlling the bowels or the bladder. The patient indicates that the left leg discomfort comes and goes, but usually is present with weightbearing, but occasionally she will note the pain while lying down. The patient does report some low back pain. The patient comes to this office for an evaluation.  Past Medical History  Diagnosis Date  . Hypertension   . Hyperchloremia   . Rosacea   . Diabetes mellitus     diet controlled  . Coronary artery disease   . Obesity   . Rosacea   . GERD (gastroesophageal reflux disease)   . Gait disorder   . Celiac sprue     Past Surgical History  Procedure Laterality Date  . Abdominal hysterectomy    . Brain tumor excision  1993    meningioma, left frontal  . Joint replacement  2009  . Coronary artery bypass graft  2002     3 vessel  . Rotator cuff repair  2010    Dr. Darrelyn Hillock  . Bunionectomy    . Replacement total  knee    . Tonsillectomy      Family History  Problem Relation Age of Onset  . Diabetes Mother   . Heart disease Brother   . Diabetes Sister   . Diabetes Sister     Social history:  reports that she has never smoked. She does not have any smokeless tobacco history on file. She reports that she does not drink alcohol or use illicit drugs.  Allergies:  Allergies  Allergen Reactions  . Accutane (Isotretinoin) Other (See Comments)    Affected liver  . Ciprofloxacin Other (See Comments)    Kidney failure  . Erythromycin Diarrhea and Rash  . Gluten Diarrhea and Other (See Comments)    Celiac sprue  . Isotretinoin Other (See Comments)    Affects liver  . Lipitor (Atorvastatin) Other (See Comments)    Affects legs  . Lisinopril Other (See Comments)       . Lovastatin Other (See Comments)    Affects legs  . Penicillins Other (See Comments)    From childhood  . Phenytoin Sodium Extended Other (See Comments)    Affects liver  . Pravastatin Other (See Comments)    Affected legs  . Boniva (Ibandronic Acid) Other (See Comments)    headaches  . Clarithromycin Rash  . Lescol (Fluvastatin Sodium) Other (See Comments)    Affects legs  . Sulfa Antibiotics  Rash  . Welchol (Colesevelam Hcl) Other (See Comments)    No energy  . Zetia (Ezetimibe) Other (See Comments)    myalgias  . Zithromax (Azithromycin) Rash    Medications:  Current Outpatient Prescriptions on File Prior to Visit  Medication Sig Dispense Refill  . amLODipine (NORVASC) 5 MG tablet Take 5 mg by mouth 2 (two) times daily.      Marland Kitchen aspirin 81 MG chewable tablet Chew 81 mg by mouth daily.      . Calcium Citrate (CITRACAL PO) Take 1 tablet by mouth daily.      . cholecalciferol (VITAMIN D) 400 UNITS TABS Take 400 Units by mouth daily.      . metroNIDAZOLE (METROCREAM) 0.75 % cream Apply 1 application topically 2 (two) times daily as needed. For rash on face      . Multiple Vitamin (MULITIVITAMIN WITH MINERALS) TABS  Take 1 tablet by mouth daily.      Marland Kitchen olmesartan (BENICAR) 20 MG tablet Take 20 mg by mouth 2 (two) times daily.      Marland Kitchen pyridOXINE (VITAMIN B-6) 100 MG tablet Take 100 mg by mouth once a week. Takes on Thursday      . tobramycin-dexamethasone (TOBRADEX) ophthalmic ointment Place 1 application into both eyes See admin instructions. Apply to eye lids when needed for irritation -only 3-7 days per month      . vitamin B-12 (CYANOCOBALAMIN) 500 MCG tablet Take 500 mcg by mouth once a week. Take on Thursday      . vitamin C (ASCORBIC ACID) 500 MG tablet Take 500 mg by mouth daily.       No current facility-administered medications on file prior to visit.    ROS:  Out of a complete 14 system review of symptoms, the patient complains only of the following symptoms, and all other reviewed systems are negative.  Weight gain Swelling in the legs Back, leg pain  Blood pressure 180/78, pulse 96, height 5\' 3"  (1.6 m), weight 180 lb (81.647 kg).  Physical Exam  General: The patient is alert and cooperative at the time of the examination. The patient is moderately obese.  Skin: 1-2+ edema below the knees is seen bilaterally.   Neurologic Exam  Cranial nerves: Facial symmetry is present. Speech is normal, no aphasia or dysarthria is noted. Extraocular movements are full. Visual fields are full.  Motor: The patient has good strength in all 4 extremities, with the exception that there is some slight weakness approximately of the left leg, possible giveaway weakness.  Sensory: The patient has a stocking pattern pinprick sensory deficit up to the knees bilaterally.  Coordination: The patient has good finger-nose-finger and heel-to-shin bilaterally.  Gait and station: The patient a slightly wide-based, limping type gait with the left leg. The patient uses a cane for ambulation. Romberg is unsteady, the patient does not fall. Tandem gait was not attempted.  Reflexes: Deep tendon reflexes are  symmetric, but are depressed.    CT lumbar report:  IMPRESSION:  1. Progressive spondylosis throughout the lumbar spine.  2. Progressive levoconvex scoliosis is centered at L3-4, worse  right than left.  3. Moderate right and mild left lateral recess narrowing at L2-3.  4. Moderate foraminal stenosis at L2-3 is worse on the right.  5. Mild foraminal narrowing bilaterally at L3-4 is worse on the  right.  6. Moderate left lateral recess narrowing at L4-5.  7. Moderate foraminal stenosis at L4-5 is worse on the left.  8. Mild foraminal  narrowing bilaterally at L5-S1.    Assessment/Plan:  1. Low back pain, left leg pain  2. Diabetes  3. Lumbosacral spinal stenosis  Prior CT and lumbosacral myelogram have indicated progressive spinal stenosis at the L3-4 level with facet joint arthritis and multilevel neuroforaminal stenosis. The patient may be having some nerve root impingement in the back leading to her left leg discomfort. The patient will be set up for nerve conduction studies of both legs, and EMG study of the left leg. The patient may be a candidate for an epidural steroid injection in the future. The patient will followup for the above study.  Marlan Palau MD 01/24/2013 11:38 AM  [facility] [address] [place], [place] [address]  Phone [phone] Fax [phone]

## 2013-01-30 ENCOUNTER — Ambulatory Visit (INDEPENDENT_AMBULATORY_CARE_PROVIDER_SITE_OTHER): Payer: Medicare Other | Admitting: Neurology

## 2013-01-30 ENCOUNTER — Encounter (INDEPENDENT_AMBULATORY_CARE_PROVIDER_SITE_OTHER): Payer: Medicare Other

## 2013-01-30 DIAGNOSIS — G63 Polyneuropathy in diseases classified elsewhere: Secondary | ICD-10-CM | POA: Diagnosis not present

## 2013-01-30 DIAGNOSIS — R269 Unspecified abnormalities of gait and mobility: Secondary | ICD-10-CM

## 2013-01-30 DIAGNOSIS — G544 Lumbosacral root disorders, not elsewhere classified: Secondary | ICD-10-CM | POA: Diagnosis not present

## 2013-01-30 DIAGNOSIS — Z0289 Encounter for other administrative examinations: Secondary | ICD-10-CM

## 2013-01-30 DIAGNOSIS — M79609 Pain in unspecified limb: Secondary | ICD-10-CM

## 2013-01-30 NOTE — Procedures (Signed)
  HISTORY:  [name] is an 79 year old patient with a history of lumbosacral spondylosis, and chronic low back pain. The patient has pain on the right side or back, and some discomfort going down the left leg. The patient has multilevel spondylosis of the low back. The patient is being evaluated for a lumbosacral radiculopathy.  NERVE CONDUCTION STUDIES:  Nerve conduction studies were performed on the right upper extremity. The distal motor latencies and motor amplitudes for the median and ulnar nerves were within normal limits. The F wave latencies and nerve conduction velocities for these nerves were also normal. The sensory latencies for the median and ulnar nerves were normal.  Nerve conduction studies were performed on both lower extremities. The distal motor latencies for the peroneal and posterior tibial nerves were normal bilaterally, with low motor amplitudes for these nerves bilaterally. The H reflex latencies were symmetric and normal, and the peroneal sensory latencies were absent bilaterally.  EMG STUDIES:  EMG study was performed on the left lower extremity:  The tibialis anterior muscle reveals 2 to 4K motor units with full recruitment. No fibrillations or positive waves were seen. The peroneus tertius muscle reveals 2 to 4K motor units with full recruitment. No fibrillations or positive waves were seen. The medial gastrocnemius muscle reveals 2 to 5K motor units with decreased recruitment. No fibrillations or positive waves were seen. The vastus lateralis muscle reveals 2 to 4K motor units with full recruitment. No fibrillations or positive waves were seen. The iliopsoas muscle reveals 2 to 4K motor units with full recruitment. No fibrillations or positive waves were seen. The biceps femoris muscle (long head) reveals 2 to 4K motor units with full recruitment. No fibrillations or positive waves were seen. The lumbosacral paraspinal muscles were tested at 3 levels, and revealed no  abnormalities of insertional activity at the upper level tested. One plus fibrillations and positive waves were seen at the middle and lower levels. There was good relaxation.   IMPRESSION:  Nerve conduction studies done on the right upper extremity and both lower extremities reveals evidence of a mild primarily axonal peripheral neuropathy. EMG evaluation of the left lower extremity shows findings consistent with a primarily chronic, stable S1 radiculopathy. No other significant abnormalities were seen.  Marlan Palau MD 01/30/2013 10:55 AM  [facility] [address] [place], [place] [address]  Phone [phone] Fax [phone]

## 2013-01-30 NOTE — Progress Notes (Signed)
[name] is an 79 year old patient with a history of chronic low back pain, some pain down the left leg and some pain going across the back to the right side. The patient is having increasing difficulty with the pain. The patient returns for nerve conduction studies and EMG evaluation. These studies show evidence of a mild primarily axonal peripheral neuropathy, and evidence of a mild, chronic, stable S1 radiculopathy on the left.  The patient indicates that the pain is intolerable at this time. Prior evaluation the low back shows multilevel neuroforaminal stenosis and spondylosis. The patient will be set up for an epidural steroid injection.  The patient followup in 4 months.

## 2013-02-03 ENCOUNTER — Other Ambulatory Visit: Payer: Self-pay | Admitting: Neurology

## 2013-02-03 DIAGNOSIS — G544 Lumbosacral root disorders, not elsewhere classified: Secondary | ICD-10-CM

## 2013-02-06 ENCOUNTER — Ambulatory Visit
Admission: RE | Admit: 2013-02-06 | Discharge: 2013-02-06 | Disposition: A | Discharge status: Home / Self Care | Payer: Medicare Other | Source: Ambulatory Visit | Attending: Neurology | Admitting: Neurology

## 2013-02-06 VITALS — BP 129/59 | HR 60

## 2013-02-06 DIAGNOSIS — G544 Lumbosacral root disorders, not elsewhere classified: Secondary | ICD-10-CM

## 2013-02-06 DIAGNOSIS — M545 Low back pain: Secondary | ICD-10-CM | POA: Diagnosis not present

## 2013-02-06 DIAGNOSIS — M47817 Spondylosis without myelopathy or radiculopathy, lumbosacral region: Secondary | ICD-10-CM | POA: Diagnosis not present

## 2013-02-06 DIAGNOSIS — M5126 Other intervertebral disc displacement, lumbar region: Secondary | ICD-10-CM

## 2013-02-06 MED ORDER — METHYLPREDNISOLONE ACETATE 40 MG/ML INJ SUSP (RADIOLOG: 120.0000 mg | Freq: Once | INTRAMUSCULAR | Status: DC

## 2013-02-06 MED ORDER — IOHEXOL 180 MG/ML  SOLN
1.0000 mL | Freq: Once | INTRAMUSCULAR | Status: AC | PRN
  Administered 2013-02-06: 1 mL via EPIDURAL

## 2013-02-09 DIAGNOSIS — N390 Urinary tract infection, site not specified: Secondary | ICD-10-CM | POA: Diagnosis not present

## 2013-02-11 DIAGNOSIS — H903 Sensorineural hearing loss, bilateral: Secondary | ICD-10-CM | POA: Diagnosis not present

## 2013-02-12 ENCOUNTER — Ambulatory Visit: Payer: Self-pay | Admitting: Neurology

## 2013-02-13 DIAGNOSIS — R1031 Right lower quadrant pain: Secondary | ICD-10-CM | POA: Diagnosis not present

## 2013-03-05 DIAGNOSIS — K2940 Chronic atrophic gastritis without bleeding: Secondary | ICD-10-CM | POA: Diagnosis not present

## 2013-03-05 DIAGNOSIS — K2960 Other gastritis without bleeding: Secondary | ICD-10-CM | POA: Diagnosis not present

## 2013-03-05 DIAGNOSIS — K5289 Other specified noninfective gastroenteritis and colitis: Secondary | ICD-10-CM | POA: Diagnosis not present

## 2013-03-05 DIAGNOSIS — R1031 Right lower quadrant pain: Secondary | ICD-10-CM | POA: Diagnosis not present

## 2013-03-05 DIAGNOSIS — K222 Esophageal obstruction: Secondary | ICD-10-CM | POA: Diagnosis not present

## 2013-03-05 DIAGNOSIS — D126 Benign neoplasm of colon, unspecified: Secondary | ICD-10-CM | POA: Diagnosis not present

## 2013-03-05 DIAGNOSIS — K2970 Gastritis, unspecified, without bleeding: Secondary | ICD-10-CM | POA: Diagnosis not present

## 2013-03-05 DIAGNOSIS — D131 Benign neoplasm of stomach: Secondary | ICD-10-CM | POA: Diagnosis not present

## 2013-03-05 DIAGNOSIS — K5190 Ulcerative colitis, unspecified, without complications: Secondary | ICD-10-CM | POA: Diagnosis not present

## 2013-03-05 DIAGNOSIS — K5730 Diverticulosis of large intestine without perforation or abscess without bleeding: Secondary | ICD-10-CM | POA: Diagnosis not present

## 2013-03-05 DIAGNOSIS — Z1211 Encounter for screening for malignant neoplasm of colon: Secondary | ICD-10-CM | POA: Diagnosis not present

## 2013-03-20 DIAGNOSIS — I10 Essential (primary) hypertension: Secondary | ICD-10-CM | POA: Diagnosis not present

## 2013-03-20 DIAGNOSIS — I2510 Atherosclerotic heart disease of native coronary artery without angina pectoris: Secondary | ICD-10-CM | POA: Diagnosis not present

## 2013-03-20 DIAGNOSIS — E118 Type 2 diabetes mellitus with unspecified complications: Secondary | ICD-10-CM | POA: Diagnosis not present

## 2013-03-20 DIAGNOSIS — K900 Celiac disease: Secondary | ICD-10-CM | POA: Diagnosis not present

## 2013-03-20 DIAGNOSIS — Z0000 Encounter for general adult medical examination without abnormal findings: Secondary | ICD-10-CM | POA: Diagnosis not present

## 2013-03-20 DIAGNOSIS — E7800 Pure hypercholesterolemia, unspecified: Secondary | ICD-10-CM | POA: Diagnosis not present

## 2013-03-20 DIAGNOSIS — M810 Age-related osteoporosis without current pathological fracture: Secondary | ICD-10-CM | POA: Diagnosis not present

## 2013-03-20 DIAGNOSIS — E559 Vitamin D deficiency, unspecified: Secondary | ICD-10-CM | POA: Diagnosis not present

## 2013-03-25 DIAGNOSIS — M79609 Pain in unspecified limb: Secondary | ICD-10-CM | POA: Diagnosis not present

## 2013-03-25 DIAGNOSIS — M810 Age-related osteoporosis without current pathological fracture: Secondary | ICD-10-CM | POA: Diagnosis not present

## 2013-03-25 DIAGNOSIS — M48061 Spinal stenosis, lumbar region without neurogenic claudication: Secondary | ICD-10-CM | POA: Diagnosis not present

## 2013-03-26 DIAGNOSIS — M5137 Other intervertebral disc degeneration, lumbosacral region: Secondary | ICD-10-CM | POA: Diagnosis not present

## 2013-03-26 DIAGNOSIS — M542 Cervicalgia: Secondary | ICD-10-CM | POA: Diagnosis not present

## 2013-03-26 DIAGNOSIS — M545 Low back pain: Secondary | ICD-10-CM | POA: Diagnosis not present

## 2013-03-26 DIAGNOSIS — M4120 Other idiopathic scoliosis, site unspecified: Secondary | ICD-10-CM | POA: Diagnosis not present

## 2013-03-31 DIAGNOSIS — M545 Low back pain: Secondary | ICD-10-CM | POA: Diagnosis not present

## 2013-04-07 DIAGNOSIS — M545 Low back pain: Secondary | ICD-10-CM | POA: Diagnosis not present

## 2013-04-09 DIAGNOSIS — M545 Low back pain: Secondary | ICD-10-CM | POA: Diagnosis not present

## 2013-04-09 DIAGNOSIS — R609 Edema, unspecified: Secondary | ICD-10-CM | POA: Diagnosis not present

## 2013-04-09 DIAGNOSIS — I10 Essential (primary) hypertension: Secondary | ICD-10-CM | POA: Diagnosis not present

## 2013-04-10 DIAGNOSIS — R1011 Right upper quadrant pain: Secondary | ICD-10-CM | POA: Diagnosis not present

## 2013-04-14 DIAGNOSIS — M545 Low back pain: Secondary | ICD-10-CM | POA: Diagnosis not present

## 2013-04-16 DIAGNOSIS — M545 Low back pain: Secondary | ICD-10-CM | POA: Diagnosis not present

## 2013-04-21 DIAGNOSIS — M545 Low back pain: Secondary | ICD-10-CM | POA: Diagnosis not present

## 2013-04-22 DIAGNOSIS — Z23 Encounter for immunization: Secondary | ICD-10-CM | POA: Diagnosis not present

## 2013-04-23 DIAGNOSIS — M545 Low back pain: Secondary | ICD-10-CM | POA: Diagnosis not present

## 2013-04-28 DIAGNOSIS — M545 Low back pain: Secondary | ICD-10-CM | POA: Diagnosis not present

## 2013-04-30 DIAGNOSIS — M545 Low back pain: Secondary | ICD-10-CM | POA: Diagnosis not present

## 2013-05-05 DIAGNOSIS — M545 Low back pain: Secondary | ICD-10-CM | POA: Diagnosis not present

## 2013-05-06 ENCOUNTER — Encounter: Payer: Self-pay | Admitting: Cardiology

## 2013-05-06 DIAGNOSIS — R7989 Other specified abnormal findings of blood chemistry: Secondary | ICD-10-CM | POA: Diagnosis not present

## 2013-05-06 DIAGNOSIS — I10 Essential (primary) hypertension: Secondary | ICD-10-CM | POA: Diagnosis not present

## 2013-05-06 DIAGNOSIS — M810 Age-related osteoporosis without current pathological fracture: Secondary | ICD-10-CM | POA: Diagnosis not present

## 2013-05-07 ENCOUNTER — Encounter: Payer: Self-pay | Admitting: General Surgery

## 2013-05-07 ENCOUNTER — Ambulatory Visit (INDEPENDENT_AMBULATORY_CARE_PROVIDER_SITE_OTHER): Payer: Medicare Other | Admitting: Cardiology

## 2013-05-07 ENCOUNTER — Encounter: Payer: Self-pay | Admitting: Cardiology

## 2013-05-07 VITALS — BP 142/64 | HR 88 | Ht 63.0 in | Wt 181.0 lb

## 2013-05-07 DIAGNOSIS — I2510 Atherosclerotic heart disease of native coronary artery without angina pectoris: Secondary | ICD-10-CM | POA: Diagnosis not present

## 2013-05-07 DIAGNOSIS — I10 Essential (primary) hypertension: Secondary | ICD-10-CM

## 2013-05-07 DIAGNOSIS — E785 Hyperlipidemia, unspecified: Secondary | ICD-10-CM | POA: Diagnosis not present

## 2013-05-07 DIAGNOSIS — R0989 Other specified symptoms and signs involving the circulatory and respiratory systems: Secondary | ICD-10-CM

## 2013-05-07 DIAGNOSIS — M545 Low back pain: Secondary | ICD-10-CM | POA: Diagnosis not present

## 2013-05-07 DIAGNOSIS — R011 Cardiac murmur, unspecified: Secondary | ICD-10-CM | POA: Insufficient documentation

## 2013-05-07 NOTE — Progress Notes (Signed)
[address] [place], [place]  [address] Phone: [phone] Fax:  [phone]  Date:  05/07/2013   ID:  [name], DOB 1928/10/15, MRN [id number]  PCP:  Mickie Hillier, MD  Cardiologist:  Armanda Magic, MD     History of Present Illness: [name] is a 79 y.o. female with a history of ASCAD, HTN and dyslipidemia who presents today for followup.     Wt Readings from Last 3 Encounters:  05/07/13 181 lb (82.101 kg)  01/23/13 180 lb (81.647 kg)  10/10/10 186 lb (84.369 kg)     Past Medical History  Diagnosis Date  . Hyperchloremia   . Rosacea   . Diabetes mellitus     diet controlled  . Obesity   . Rosacea   . GERD (gastroesophageal reflux disease)   . Gait disorder   . Celiac sprue   . Coronary artery disease     s/p CABG with 50% stenosis at ostium of the SVG to dirst diagonal, patent LIMA to LAD and patent left circ with nonobstructive ASCAD of the RCA  . Hyperlipidemia     statin intolerant  . Hypertension   . Diverticulitis   . Meningioma     Current Outpatient Prescriptions  Medication Sig Dispense Refill  . acetaminophen (TYLENOL) 650 MG CR tablet Take 650 mg by mouth every 6 (six) hours as needed for pain.      Marland Kitchen amLODipine (NORVASC) 5 MG tablet Take 5 mg by mouth 2 (two) times daily.      Marland Kitchen aspirin 81 MG tablet Take 81 mg by mouth daily.      . Calcium Citrate (CITRACAL PO) Take 1 tablet by mouth daily.      . cholecalciferol (VITAMIN D) 400 UNITS TABS Take 400 Units by mouth daily.      . cloNIDine (CATAPRES) 0.1 MG tablet Take 0.1 mg by mouth 2 (two) times daily.       . furosemide (LASIX) 40 MG tablet Take 40 mg by mouth 2 (two) times daily.       . hyoscyamine (ANASPAZ) 0.125 MG TBDP tablet Place 0.125 mg under the tongue 3 (three) times daily after meals.      . metroNIDAZOLE (METROCREAM) 0.75 % cream Apply 1 application topically 2 (two) times daily as needed. For rash on face      . Multiple Vitamin (MULITIVITAMIN WITH MINERALS)  TABS Take 1 tablet by mouth daily.      Marland Kitchen olmesartan (BENICAR) 20 MG tablet Take 20 mg by mouth 2 (two) times daily.      Marland Kitchen PROTONIX 40 MG tablet Take 40 mg by mouth daily.       Marland Kitchen pyridOXINE (VITAMIN B-6) 100 MG tablet Take 100 mg by mouth once a week. Takes on Thursday      . tobramycin-dexamethasone (TOBRADEX) ophthalmic ointment Place 1 application into both eyes See admin instructions. Apply to eye lids when needed for irritation -only 3-7 days per month      . vitamin B-12 (CYANOCOBALAMIN) 500 MCG tablet Take 500 mcg by mouth once a week. Take on Thursday      . vitamin C (ASCORBIC ACID) 500 MG tablet Take 500 mg by mouth daily.       No current facility-administered medications for this visit.    Allergies:    Allergies  Allergen Reactions  . Accutane [Isotretinoin] Other (See Comments)    Affected liver  . Ciprofloxacin Other (See Comments)  Kidney failure  . Erythromycin Diarrhea and Rash  . Gluten Diarrhea and Other (See Comments)    Celiac sprue  . Isotretinoin Other (See Comments)    Affects liver  . Lipitor [Atorvastatin] Other (See Comments)    Affects legs  . Lisinopril Other (See Comments)       . Lovastatin Other (See Comments)    Affects legs  . Penicillins Other (See Comments)    From childhood  . Phenytoin Sodium Extended Other (See Comments)    Affects liver  . Pravastatin Other (See Comments)    Affected legs  . Prolia [Denosumab]   . Boniva [Ibandronic Acid] Other (See Comments)    headaches  . Clarithromycin Rash  . Lescol [Fluvastatin Sodium] Other (See Comments)    Affects legs  . Sulfa Antibiotics Rash  . Welchol [Colesevelam Hcl] Other (See Comments)    No energy  . Zetia [Ezetimibe] Other (See Comments)    myalgias  . Zithromax [Azithromycin] Rash    Social History:  The patient  reports that she has never smoked. She does not have any smokeless tobacco history on file. She reports that she does not drink alcohol or use illicit drugs.     Family History:  The patient's family history includes Diabetes in her mother, sister, and sister; Heart disease in her brother.   ROS:  Please see the history of present illness.      All other systems reviewed and negative.   PHYSICAL EXAM: VS:  BP 142/64  Pulse 88  Ht 5\' 3"  (1.6 m)  Wt 181 lb (82.101 kg)  BMI 32.07 kg/m2 Well nourished, well developed, in no acute distress HEENT: normal Neck: no JVD, carotid artery bruit right Cardiac:  normal S1, S2; RRR; 2/6 SM at RUSB radiating to LLSB Lungs:  clear to auscultation bilaterally, no wheezing, rhonchi or rales Abd: soft, nontender, no hepatomegaly Ext: no edema Skin: warm and dry Neuro:  CNs 2-12 intact, no focal abnormalities noted       ASSESSMENT AND PLAN:  1. ASCAD with no angina  - continue ASA 2. HTN - controlled  - continue amlodipine/Olmesartan/Clonidine 3. Dyslipidemia - statin intolerant.  Her last LDL was 141.  - refer to lipid clinic 4. Chronic LE edema stable on diuretics  - continue Lasix  - BMET in October 2014 stable 5.  Heart murmur - will get 2D echo 6.  Right carotid artery bruit - will get carotid doppler to assess  Followup with me in 6 months.    Signed, Armanda Magic, MD 05/07/2013 1:52 PM

## 2013-05-07 NOTE — Patient Instructions (Addendum)
Your physician recommends that you continue on your current medications as directed. Please refer to the Current Medication list given to you today.  Your physician has requested that you have a carotid duplex. This test is an ultrasound of the carotid arteries in your neck. It looks at blood flow through these arteries that supply the brain with blood. Allow one hour for this exam. There are no restrictions or special instructions.  Your physician has requested that you have an echocardiogram. Echocardiography is a painless test that uses sound waves to create images of your heart. It provides your doctor with information about the size and shape of your heart and how well your heart's chambers and valves are working. This procedure takes approximately one hour. There are no restrictions for this procedure.  We are going to refer you to our Lipid Clinic with [facility]  Your physician wants you to follow-up in: 6 Months with Dr. Sherlyn Lick will receive a reminder letter in the mail two months in advance. If you don't receive a letter, please call our office to schedule the follow-up appointment.

## 2013-05-11 DIAGNOSIS — M545 Low back pain: Secondary | ICD-10-CM | POA: Diagnosis not present

## 2013-05-13 DIAGNOSIS — M545 Low back pain: Secondary | ICD-10-CM | POA: Diagnosis not present

## 2013-05-18 DIAGNOSIS — I129 Hypertensive chronic kidney disease with stage 1 through stage 4 chronic kidney disease, or unspecified chronic kidney disease: Secondary | ICD-10-CM | POA: Diagnosis not present

## 2013-05-18 DIAGNOSIS — N182 Chronic kidney disease, stage 2 (mild): Secondary | ICD-10-CM | POA: Diagnosis not present

## 2013-05-19 DIAGNOSIS — M545 Low back pain: Secondary | ICD-10-CM | POA: Diagnosis not present

## 2013-05-21 DIAGNOSIS — M545 Low back pain: Secondary | ICD-10-CM | POA: Diagnosis not present

## 2013-05-21 DIAGNOSIS — M4120 Other idiopathic scoliosis, site unspecified: Secondary | ICD-10-CM | POA: Diagnosis not present

## 2013-05-26 DIAGNOSIS — M545 Low back pain: Secondary | ICD-10-CM | POA: Diagnosis not present

## 2013-05-28 ENCOUNTER — Ambulatory Visit (HOSPITAL_COMMUNITY): Payer: Medicare Other | Attending: Cardiology

## 2013-05-28 DIAGNOSIS — I10 Essential (primary) hypertension: Secondary | ICD-10-CM | POA: Diagnosis not present

## 2013-05-28 DIAGNOSIS — I658 Occlusion and stenosis of other precerebral arteries: Secondary | ICD-10-CM | POA: Diagnosis not present

## 2013-05-28 DIAGNOSIS — E785 Hyperlipidemia, unspecified: Secondary | ICD-10-CM | POA: Insufficient documentation

## 2013-05-28 DIAGNOSIS — R0989 Other specified symptoms and signs involving the circulatory and respiratory systems: Secondary | ICD-10-CM | POA: Diagnosis not present

## 2013-05-28 DIAGNOSIS — I6529 Occlusion and stenosis of unspecified carotid artery: Secondary | ICD-10-CM

## 2013-05-28 DIAGNOSIS — L719 Rosacea, unspecified: Secondary | ICD-10-CM | POA: Diagnosis not present

## 2013-05-28 DIAGNOSIS — H01009 Unspecified blepharitis unspecified eye, unspecified eyelid: Secondary | ICD-10-CM | POA: Diagnosis not present

## 2013-05-28 DIAGNOSIS — E119 Type 2 diabetes mellitus without complications: Secondary | ICD-10-CM | POA: Insufficient documentation

## 2013-05-28 DIAGNOSIS — H40019 Open angle with borderline findings, low risk, unspecified eye: Secondary | ICD-10-CM | POA: Diagnosis not present

## 2013-05-29 ENCOUNTER — Encounter: Payer: Self-pay | Admitting: Internal Medicine

## 2013-05-29 ENCOUNTER — Ambulatory Visit (HOSPITAL_COMMUNITY): Payer: Medicare Other | Attending: Internal Medicine | Admitting: Cardiology

## 2013-05-29 ENCOUNTER — Other Ambulatory Visit: Payer: Self-pay | Admitting: General Surgery

## 2013-05-29 ENCOUNTER — Encounter: Payer: Self-pay | Admitting: General Surgery

## 2013-05-29 ENCOUNTER — Ambulatory Visit (INDEPENDENT_AMBULATORY_CARE_PROVIDER_SITE_OTHER): Payer: Medicare Other | Admitting: Pharmacist

## 2013-05-29 VITALS — Wt 182.0 lb

## 2013-05-29 DIAGNOSIS — R011 Cardiac murmur, unspecified: Secondary | ICD-10-CM

## 2013-05-29 DIAGNOSIS — E119 Type 2 diabetes mellitus without complications: Secondary | ICD-10-CM | POA: Insufficient documentation

## 2013-05-29 DIAGNOSIS — E785 Hyperlipidemia, unspecified: Secondary | ICD-10-CM

## 2013-05-29 DIAGNOSIS — I10 Essential (primary) hypertension: Secondary | ICD-10-CM | POA: Diagnosis not present

## 2013-05-29 DIAGNOSIS — I2510 Atherosclerotic heart disease of native coronary artery without angina pectoris: Secondary | ICD-10-CM

## 2013-05-29 DIAGNOSIS — Z79899 Other long term (current) drug therapy: Secondary | ICD-10-CM | POA: Diagnosis not present

## 2013-05-29 MED ORDER — ROSUVASTATIN CALCIUM 10 MG PO TABS: 5.0000 mg | ORAL_TABLET | ORAL | Status: DC

## 2013-05-29 MED ORDER — BIOTIN 5 MG PO CAPS: 5.0000 mg | ORAL_CAPSULE | Freq: Every day | ORAL | Status: DC

## 2013-05-29 NOTE — Progress Notes (Signed)
Patient referred to lipid clinic by Dr. Mayford Knife due to h/o CABG with LDL of 141 mg/dL, and intolerant to all lipid lowering medications she has tried.  She has tried nearly all daily statin, Zetia, and Welchol.  She has a strong family h/o CAD and diabetes.  She has Type 2 DM but this is diet controlled.  She is also c/o her hair thinning which she feels is secondary to medication she took in the past, and wants to know if there is anything she can take for this.  Risk factors:  CABG, Diabetes, HTN, age - LDL goal < 70 (would at least like to get to < 100 mg/dL if possible) Meds:  Not on lipid lowering meds currently. Intolerant:  Daily lipitor, lovastatin, pravastatin, fluvastatin, simvastatin, Crestor, and Zetia all caused muscle aches.  Welchol caused extreme fatigue.  Social history:  Denies tobacco use.  Denies alcohol use. Family history:  Both her mother and father had CAD.  Her sister and mother also had diabetes.  Diet:  Patient eats a low fat diet already.  Breakfast - oatmeal and fruit daily.  Lunch - sandwich and fruit typically.  Dinner - mostly vegetables; she rarely eats meat.  She doesn't drink tea or soda.  Rarely eats desserts. Exercise:  None.  Patient relies on 2 canes to walk.  Labs:   03/2013 - LDL 141, TC 240, TG 193, HDL 61.  Vitamin D 25-OH 66, A1c 5.9 (not on lipid lowering medication)  Current Outpatient Prescriptions  Medication Sig Dispense Refill  . acetaminophen (TYLENOL) 650 MG CR tablet Take 650 mg by mouth every 6 (six) hours as needed for pain.      Marland Kitchen amLODipine (NORVASC) 5 MG tablet Take 5 mg by mouth 2 (two) times daily.      Marland Kitchen aspirin 81 MG tablet Take 81 mg by mouth daily.      . Calcium Citrate (CITRACAL PO) Take 1 tablet by mouth daily.      . cholecalciferol (VITAMIN D) 400 UNITS TABS Take 400 Units by mouth daily.      . cloNIDine (CATAPRES) 0.1 MG tablet Take 0.1 mg by mouth 2 (two) times daily.       . furosemide (LASIX) 40 MG tablet Take 40 mg by  mouth 2 (two) times daily.       . hyoscyamine (ANASPAZ) 0.125 MG TBDP tablet Place 0.125 mg under the tongue 3 (three) times daily after meals.      . metroNIDAZOLE (METROCREAM) 0.75 % cream Apply 1 application topically 2 (two) times daily as needed. For rash on face      . Multiple Vitamin (MULITIVITAMIN WITH MINERALS) TABS Take 1 tablet by mouth daily.      Marland Kitchen olmesartan (BENICAR) 20 MG tablet Take 20 mg by mouth 2 (two) times daily.      Marland Kitchen PROTONIX 40 MG tablet Take 40 mg by mouth daily.       Marland Kitchen pyridOXINE (VITAMIN B-6) 100 MG tablet Take 100 mg by mouth once a week. Takes on Thursday      . tobramycin-dexamethasone (TOBRADEX) ophthalmic ointment Place 1 application into both eyes See admin instructions. Apply to eye lids when needed for irritation -only 3-7 days per month      . vitamin B-12 (CYANOCOBALAMIN) 500 MCG tablet Take 500 mcg by mouth once a week. Take on Thursday      . vitamin C (ASCORBIC ACID) 500 MG tablet Take 500 mg by mouth daily.  No current facility-administered medications for this visit.   Allergies  Allergen Reactions  . Accutane [Isotretinoin] Other (See Comments)    Affected liver  . Ciprofloxacin Other (See Comments)    Kidney failure  . Erythromycin Diarrhea and Rash  . Gluten Diarrhea and Other (See Comments)    Celiac sprue  . Isotretinoin Other (See Comments)    Affects liver  . Lipitor [Atorvastatin] Other (See Comments)    Affects legs  . Lisinopril Other (See Comments)       . Lovastatin Other (See Comments)    Affects legs  . Penicillins Other (See Comments)    From childhood  . Phenytoin Sodium Extended Other (See Comments)    Affects liver  . Pravastatin Other (See Comments)    Affected legs  . Crestor [Rosuvastatin]     Muscle aches with 5 mg daily  . Prolia [Denosumab]   . Boniva [Ibandronic Acid] Other (See Comments)    headaches  . Clarithromycin Rash  . Lescol [Fluvastatin Sodium] Other (See Comments)    Affects legs  .  Sulfa Antibiotics Rash  . Welchol [Colesevelam Hcl] Other (See Comments)    No energy  . Zetia [Ezetimibe] Other (See Comments)    myalgias  . Zithromax [Azithromycin] Rash

## 2013-05-29 NOTE — Patient Instructions (Signed)
1.  Start Crestor 10 mg - 1/2 tablet once weekly. 2.  Start biotin 5 mg (or 5,000 mcg daily).  Vitamin B supplement. 3.  Check cholesterol in 4 months (09/22/13) for fasting blood work.

## 2013-05-29 NOTE — Progress Notes (Signed)
Echo performed. 

## 2013-05-29 NOTE — Assessment & Plan Note (Addendum)
Given her multiple medication failures, will not restart a daily statin, but instead start her on once weekly Crestor.  Her vitamin D is already in normal range, so this is not exacerbating side effects.  Patient's diet looks good already, and exercise not an option at this time given she walks with two canes.  Patient wanted something to help with her hair growth, so biotin 5 mg daily recommended.  Will have patient recheck cholesterol in 4 months.  Given we can likely not treat her more aggressively than this, will not have her f/u with me after blood work.  Patient agreeable to this.  Will reassess lipids 09/2013. Plan: 1.  Start Crestor 10 mg - 1/2 tablet once weekly. 2.  Start biotin 5 mg (or 5,000 mcg daily).  Vitamin B supplement. 3.  Check cholesterol in 4 months (09/22/13) for fasting blood work.

## 2013-06-20 ENCOUNTER — Encounter (HOSPITAL_COMMUNITY): Payer: Self-pay | Admitting: Emergency Medicine

## 2013-06-20 ENCOUNTER — Emergency Department (HOSPITAL_COMMUNITY)
Admission: EM | Admit: 2013-06-20 | Discharge: 2013-06-20 | Disposition: A | Discharge status: Home / Self Care | Payer: Medicare Other | Attending: Emergency Medicine | Admitting: Emergency Medicine

## 2013-06-20 DIAGNOSIS — I2510 Atherosclerotic heart disease of native coronary artery without angina pectoris: Secondary | ICD-10-CM | POA: Diagnosis not present

## 2013-06-20 DIAGNOSIS — R609 Edema, unspecified: Secondary | ICD-10-CM | POA: Diagnosis not present

## 2013-06-20 DIAGNOSIS — Z7982 Long term (current) use of aspirin: Secondary | ICD-10-CM | POA: Diagnosis not present

## 2013-06-20 DIAGNOSIS — Z880 Allergy status to penicillin: Secondary | ICD-10-CM | POA: Insufficient documentation

## 2013-06-20 DIAGNOSIS — M7989 Other specified soft tissue disorders: Secondary | ICD-10-CM

## 2013-06-20 DIAGNOSIS — K219 Gastro-esophageal reflux disease without esophagitis: Secondary | ICD-10-CM | POA: Insufficient documentation

## 2013-06-20 DIAGNOSIS — E119 Type 2 diabetes mellitus without complications: Secondary | ICD-10-CM | POA: Insufficient documentation

## 2013-06-20 DIAGNOSIS — M25579 Pain in unspecified ankle and joints of unspecified foot: Secondary | ICD-10-CM | POA: Diagnosis not present

## 2013-06-20 DIAGNOSIS — Z792 Long term (current) use of antibiotics: Secondary | ICD-10-CM | POA: Diagnosis not present

## 2013-06-20 DIAGNOSIS — Z79899 Other long term (current) drug therapy: Secondary | ICD-10-CM | POA: Diagnosis not present

## 2013-06-20 DIAGNOSIS — E669 Obesity, unspecified: Secondary | ICD-10-CM | POA: Insufficient documentation

## 2013-06-20 DIAGNOSIS — Z951 Presence of aortocoronary bypass graft: Secondary | ICD-10-CM | POA: Insufficient documentation

## 2013-06-20 DIAGNOSIS — L02419 Cutaneous abscess of limb, unspecified: Secondary | ICD-10-CM | POA: Insufficient documentation

## 2013-06-20 DIAGNOSIS — M79609 Pain in unspecified limb: Secondary | ICD-10-CM

## 2013-06-20 DIAGNOSIS — Z872 Personal history of diseases of the skin and subcutaneous tissue: Secondary | ICD-10-CM | POA: Insufficient documentation

## 2013-06-20 DIAGNOSIS — Z8669 Personal history of other diseases of the nervous system and sense organs: Secondary | ICD-10-CM | POA: Diagnosis not present

## 2013-06-20 DIAGNOSIS — E785 Hyperlipidemia, unspecified: Secondary | ICD-10-CM | POA: Diagnosis not present

## 2013-06-20 DIAGNOSIS — L03119 Cellulitis of unspecified part of limb: Principal | ICD-10-CM

## 2013-06-20 DIAGNOSIS — L0390 Cellulitis, unspecified: Secondary | ICD-10-CM

## 2013-06-20 LAB — BASIC METABOLIC PANEL
BUN: 11 mg/dL (ref 6–23)
CHLORIDE: 101 meq/L (ref 96–112)
CO2: 26 mEq/L (ref 19–32)
CREATININE: 0.86 mg/dL (ref 0.50–1.10)
Calcium: 8.4 mg/dL (ref 8.4–10.5)
GFR calc non Af Amer: 61 mL/min — ABNORMAL LOW (ref >90)
GFR, EST AFRICAN AMERICAN: 71 mL/min — AB (ref >90)
Glucose, Bld: 108 mg/dL — ABNORMAL HIGH (ref 70–99)
Potassium: 3.3 mEq/L — ABNORMAL LOW (ref 3.7–5.3)
SODIUM: 141 meq/L (ref 137–147)

## 2013-06-20 LAB — CBC WITH DIFFERENTIAL/PLATELET
BASOS ABS: 0 10*3/uL (ref 0.0–0.1)
BASOS PCT: 0 % (ref 0–1)
Eosinophils Absolute: 0.1 10*3/uL (ref 0.0–0.7)
Eosinophils Relative: 1 % (ref 0–5)
HEMATOCRIT: 35.1 % — AB (ref 36.0–46.0)
Hemoglobin: 11.3 g/dL — ABNORMAL LOW (ref 12.0–15.0)
LYMPHS PCT: 35 % (ref 12–46)
Lymphs Abs: 4.7 10*3/uL — ABNORMAL HIGH (ref 0.7–4.0)
MCH: 24.7 pg — ABNORMAL LOW (ref 26.0–34.0)
MCHC: 32.2 g/dL (ref 30.0–36.0)
MCV: 76.6 fL — ABNORMAL LOW (ref 78.0–100.0)
MONO ABS: 0.9 10*3/uL (ref 0.1–1.0)
Monocytes Relative: 7 % (ref 3–12)
Neutro Abs: 7.6 10*3/uL (ref 1.7–7.7)
Neutrophils Relative %: 57 % (ref 43–77)
Platelets: 265 10*3/uL (ref 150–400)
RBC: 4.58 MIL/uL (ref 3.87–5.11)
RDW: 15.7 % — ABNORMAL HIGH (ref 11.5–15.5)
WBC: 13.4 10*3/uL — ABNORMAL HIGH (ref 4.0–10.5)

## 2013-06-20 MED ORDER — CEFUROXIME AXETIL 250 MG PO TABS: 250.0000 mg | ORAL_TABLET | Freq: Two times a day (BID) | ORAL | Status: DC

## 2013-06-20 MED ORDER — DEXTROSE 5 % IV SOLN
1.0000 g | INTRAVENOUS | Status: DC
  Administered 2013-06-20: 1 g via INTRAVENOUS
  Filled 2013-06-20: qty 10

## 2013-06-20 NOTE — ED Notes (Addendum)
Pt sent from [facility] walk in clinic for evaluation of LLE swelling and pain since this am. They did an xray at the clinic that showed no fracture and wanted pt to come here for DVT study. Denies any injuries. Pt reports pain is around her ankle and is worse on ambulation

## 2013-06-20 NOTE — Discharge Instructions (Signed)
Cellulitis Cellulitis is an infection of the skin and the tissue beneath it. The infected area is usually red and tender. Cellulitis occurs most often in the arms and lower legs.  CAUSES  Cellulitis is caused by bacteria that enter the skin through cracks or cuts in the skin. The most common types of bacteria that cause cellulitis are Staphylococcus and Streptococcus. SYMPTOMS   Redness and warmth.  Swelling.  Tenderness or pain.  Fever. DIAGNOSIS  Your caregiver can usually determine what is wrong based on a physical exam. Blood tests may also be done. TREATMENT  Treatment usually involves taking an antibiotic medicine. HOME CARE INSTRUCTIONS   Take your antibiotics as directed. Finish them even if you start to feel better.  Keep the infected arm or leg elevated to reduce swelling.  Apply a warm cloth to the affected area up to 4 times per day to relieve pain.  Only take over-the-counter or prescription medicines for pain, discomfort, or fever as directed by your caregiver.  Keep all follow-up appointments as directed by your caregiver. SEEK MEDICAL CARE IF:   You notice red streaks coming from the infected area.  Your red area gets larger or turns dark in color.  Your bone or joint underneath the infected area becomes painful after the skin has healed.  Your infection returns in the same area or another area.  You notice a swollen bump in the infected area.  You develop new symptoms. SEEK IMMEDIATE MEDICAL CARE IF:   You have a fever.  You feel very sleepy.  You develop vomiting or diarrhea.  You have a general ill feeling (malaise) with muscle aches and pains. MAKE SURE YOU:   Understand these instructions.  Will watch your condition.  Will get help right away if you are not doing well or get worse. Document Released: 03/14/2005 Document Revised: 12/04/2011 Document Reviewed: 08/20/2011 [employer] Patient Information 2014 [employer].  

## 2013-06-20 NOTE — Progress Notes (Signed)
VASCULAR LAB PRELIMINARY  PRELIMINARY  PRELIMINARY  PRELIMINARY  Left lower extremity venous Doppler completed.    Preliminary report:  There is no DVT or SVT noted in the left lower extremity.  Zerek Litsey, RVT 06/20/2013, 7:05 PM

## 2013-06-20 NOTE — ED Provider Notes (Signed)
CSN: [id number]     Arrival date & time 06/20/13  1812 History   First MD Initiated Contact with Patient 06/20/13 2051     Chief Complaint  Patient presents with  . Leg Swelling    HPI Patient presents to emergency room with complaints of left lower extremity pain and swelling. Patient noticed the symptoms this morning. She does not recall any falls or injury. The pain and swelling is located in her left ankle and calf. She does chronically have some swelling in that left leg asymmetrical to the right because of prior vein graft removal associated with a CABG. However, the swelling has not been as severe as it is today. Patient has not had any fevers. Denies any chest pain or shortness of breath.  [facility] that was unremarkable. They sent her to the emergency room to get a Doppler study to rule out a DVT. Past Medical History  Diagnosis Date  . Hyperchloremia   . Rosacea   . Diabetes mellitus     diet controlled  . Obesity   . Rosacea   . GERD (gastroesophageal reflux disease)   . Gait disorder   . Celiac sprue   . Coronary artery disease     s/p CABG with 50% stenosis at ostium of the SVG to dirst diagonal, patent LIMA to LAD and patent left circ with nonobstructive ASCAD of the RCA  . Hyperlipidemia     statin intolerant  . Hypertension   . Diverticulitis   . Meningioma    Past Surgical History  Procedure Laterality Date  . Abdominal hysterectomy    . Brain tumor excision  1993    meningioma, left frontal  . Joint replacement  2009  . Coronary artery bypass graft  2002     3 vessel  . Rotator cuff repair  2010    Dr. Gladstone Lighter  . Bunionectomy    . Replacement total knee    . Tonsillectomy    . Cardiac catheterization     Family History  Problem Relation Age of Onset  . Diabetes Mother   . Heart disease Brother   . Diabetes Sister   . Diabetes Sister    History  Substance Use Topics  . Smoking status: Never Smoker   .  Smokeless tobacco: Not on file  . Alcohol Use: No   OB History   Grav Para Term Preterm Abortions TAB SAB Ect Mult Living                 Review of Systems  All other systems reviewed and are negative.    Allergies  Accutane; Ciprofloxacin; Erythromycin; Gluten; Isotretinoin; Lipitor; Lisinopril; Lovastatin; Penicillins; Phenytoin sodium extended; Pravastatin; Crestor; Gabapentin; Prolia; Boniva; Clarithromycin; Lescol; Sulfa antibiotics; Welchol; Zetia; and Zithromax  Home Medications   Current Outpatient Rx  Name  Route  Sig  Dispense  Refill  . amLODipine (NORVASC) 5 MG tablet   Oral   Take 5 mg by mouth 2 (two) times daily. At noon and at bedtime         . Ascorbic Acid (VITAMIN C PO)   Oral   Take 1 tablet by mouth daily at 3 pm.         . aspirin EC 81 MG tablet   Oral   Take 81 mg by mouth daily at 3 pm.         . Biotin 5000 MCG TABS   Oral  Take 5,000 mcg by mouth daily after lunch.         . Calcium Citrate (CITRACAL PO)   Oral   Take 1 tablet by mouth daily at 3 pm.          . Cholecalciferol 4000 UNITS CAPS   Oral   Take 4,000 Units by mouth daily at 3 pm.         . cloNIDine (CATAPRES) 0.1 MG tablet   Oral   Take 0.1 mg by mouth 2 (two) times daily. At lunch and at  bedtime         . furosemide (LASIX) 40 MG tablet   Oral   Take 40 mg by mouth 2 (two) times daily. Morning and mid evening         . METRONIDAZOLE, TOPICAL, 0.75 % LOTN   Apply externally   Apply 1 application topically at bedtime as needed (rosacea).         . Multiple Vitamin (MULITIVITAMIN WITH MINERALS) TABS   Oral   Take 1 tablet by mouth daily at 3 pm.          . olmesartan (BENICAR) 20 MG tablet   Oral   Take 20 mg by mouth 2 (two) times daily.         . pantoprazole (PROTONIX) 40 MG tablet   Oral   Take 40 mg by mouth daily before breakfast.         . pyridOXINE (VITAMIN B-6) 100 MG tablet   Oral   Take 100 mg by mouth once a week. On  Thursday         . rosuvastatin (CRESTOR) 5 MG tablet   Oral   Take 5 mg by mouth once a week. On sundays         . tobramycin-dexamethasone (TOBRADEX) ophthalmic ointment   Both Eyes   Place 1 application into both eyes See admin instructions. Apply to eye lids at night when needed for irritation from rosacea - for 3 days in a row until irritation clears up         . vitamin B-12 (CYANOCOBALAMIN) 500 MCG tablet   Oral   Take 500 mcg by mouth once a week. On Thursdays         . cefUROXime (CEFTIN) 250 MG tablet   Oral   Take 1 tablet (250 mg total) by mouth 2 (two) times daily with a meal.   20 tablet   0    BP 145/56  Pulse 94  Temp(Src) 97.7 F (36.5 C) (Oral)  Resp 20  Ht 5\' 3"  (1.6 m)  Wt 185 lb 9 oz (84.171 kg)  BMI 32.88 kg/m2  SpO2 99% Physical Exam  Nursing note and vitals reviewed. Constitutional: She appears well-developed and well-nourished. No distress.  HENT:  Head: Normocephalic and atraumatic.  Right Ear: External ear normal.  Left Ear: External ear normal.  Eyes: Conjunctivae are normal. Right eye exhibits no discharge. Left eye exhibits no discharge. No scleral icterus.  Neck: Neck supple. No tracheal deviation present.  Cardiovascular: Normal rate, regular rhythm and intact distal pulses.   Pulmonary/Chest: Effort normal and breath sounds normal. No stridor. No respiratory distress. She has no wheezes. She has no rales.  Abdominal: Soft. Bowel sounds are normal. She exhibits no distension. There is no tenderness. There is no rebound and no guarding.  Musculoskeletal: She exhibits edema and tenderness.  Well-healed scar left lower extremity, edema and erythema of the left calf, no  lymphangitic streaking, no crepitus  Neurological: She is alert. She has normal strength. No sensory deficit. Cranial nerve deficit:  no gross defecits noted. She exhibits normal muscle tone. She displays no seizure activity. Coordination normal.  Skin: Skin is warm and  dry. No rash noted.  Psychiatric: She has a normal mood and affect.    ED Course  Procedures (including critical care time) Labs Review Labs Reviewed  CBC WITH DIFFERENTIAL - Abnormal; Notable for the following:    WBC 13.4 (*)    Hemoglobin 11.3 (*)    HCT 35.1 (*)    MCV 76.6 (*)    MCH 24.7 (*)    RDW 15.7 (*)    Lymphs Abs 4.7 (*)    All other components within normal limits  BASIC METABOLIC PANEL - Abnormal; Notable for the following:    Potassium 3.3 (*)    Glucose, Bld 108 (*)    GFR calc non Af Amer 61 (*)    GFR calc Af Amer 71 (*)    All other components within normal limits   Imaging Review No results found.  EKG Interpretation   None     VASCULAR LAB  PRELIMINARY PRELIMINARY PRELIMINARY PRELIMINARY  Left lower extremity venous Doppler completed.  Preliminary report: There is no DVT or SVT noted in the left lower extremity.  KANADY, CANDACE, RVT  06/20/2013, 7:05 PM   MDM   1. Pain in limb   2. Cellulitis    Suspect pt's symptoms are related to cellulitis.  Doppler study negative for DVT.  Pt given dose of rocephin in the ED.  Multiple abx allergies.  Seemed to tolerate rocephin.  Will rx 3rd generation cephalosporin primarily due to her antibiotic allergies.  Discussed follow up with PCP as well as warning signs and precautions that should have her return to the ED.    Kathalene Frames, MD 06/20/13 2300

## 2013-06-24 DIAGNOSIS — L03119 Cellulitis of unspecified part of limb: Secondary | ICD-10-CM | POA: Diagnosis not present

## 2013-06-24 DIAGNOSIS — L02419 Cutaneous abscess of limb, unspecified: Secondary | ICD-10-CM | POA: Diagnosis not present

## 2013-07-21 DIAGNOSIS — R609 Edema, unspecified: Secondary | ICD-10-CM | POA: Diagnosis not present

## 2013-07-29 DIAGNOSIS — L02619 Cutaneous abscess of unspecified foot: Secondary | ICD-10-CM | POA: Diagnosis not present

## 2013-07-29 DIAGNOSIS — R609 Edema, unspecified: Secondary | ICD-10-CM | POA: Diagnosis not present

## 2013-07-29 DIAGNOSIS — I10 Essential (primary) hypertension: Secondary | ICD-10-CM | POA: Diagnosis not present

## 2013-07-29 DIAGNOSIS — L03119 Cellulitis of unspecified part of limb: Secondary | ICD-10-CM | POA: Diagnosis not present

## 2013-07-30 DIAGNOSIS — L02619 Cutaneous abscess of unspecified foot: Secondary | ICD-10-CM | POA: Diagnosis not present

## 2013-07-30 DIAGNOSIS — L03119 Cellulitis of unspecified part of limb: Secondary | ICD-10-CM | POA: Diagnosis not present

## 2013-07-31 DIAGNOSIS — L84 Corns and callosities: Secondary | ICD-10-CM | POA: Diagnosis not present

## 2013-07-31 DIAGNOSIS — M79609 Pain in unspecified limb: Secondary | ICD-10-CM | POA: Diagnosis not present

## 2013-07-31 DIAGNOSIS — I739 Peripheral vascular disease, unspecified: Secondary | ICD-10-CM | POA: Diagnosis not present

## 2013-07-31 DIAGNOSIS — B351 Tinea unguium: Secondary | ICD-10-CM | POA: Diagnosis not present

## 2013-08-06 ENCOUNTER — Other Ambulatory Visit: Payer: Self-pay

## 2013-08-06 DIAGNOSIS — Z1231 Encounter for screening mammogram for malignant neoplasm of breast: Secondary | ICD-10-CM

## 2013-09-10 ENCOUNTER — Ambulatory Visit
Admission: RE | Admit: 2013-09-10 | Discharge: 2013-09-10 | Disposition: A | Discharge status: Home / Self Care | Payer: Medicare Other | Source: Ambulatory Visit

## 2013-09-10 DIAGNOSIS — Z1231 Encounter for screening mammogram for malignant neoplasm of breast: Secondary | ICD-10-CM | POA: Diagnosis not present

## 2013-09-17 DIAGNOSIS — Z961 Presence of intraocular lens: Secondary | ICD-10-CM | POA: Diagnosis not present

## 2013-09-17 DIAGNOSIS — H40019 Open angle with borderline findings, low risk, unspecified eye: Secondary | ICD-10-CM | POA: Diagnosis not present

## 2013-09-17 DIAGNOSIS — H18599 Other hereditary corneal dystrophies, unspecified eye: Secondary | ICD-10-CM | POA: Diagnosis not present

## 2013-09-17 DIAGNOSIS — E119 Type 2 diabetes mellitus without complications: Secondary | ICD-10-CM | POA: Diagnosis not present

## 2013-09-17 DIAGNOSIS — H524 Presbyopia: Secondary | ICD-10-CM | POA: Diagnosis not present

## 2013-09-22 ENCOUNTER — Other Ambulatory Visit (INDEPENDENT_AMBULATORY_CARE_PROVIDER_SITE_OTHER): Payer: Medicare Other | Admitting: *Deleted

## 2013-09-22 DIAGNOSIS — E785 Hyperlipidemia, unspecified: Secondary | ICD-10-CM

## 2013-09-22 DIAGNOSIS — Z79899 Other long term (current) drug therapy: Secondary | ICD-10-CM

## 2013-09-22 LAB — HEPATIC FUNCTION PANEL
ALBUMIN: 4.3 g/dL (ref 3.5–5.2)
ALK PHOS: 92 U/L (ref 39–117)
ALT: 17 U/L (ref 0–35)
AST: 25 U/L (ref 0–37)
Bilirubin, Direct: 0.2 mg/dL (ref 0.0–0.3)
TOTAL PROTEIN: 8.5 g/dL — AB (ref 6.0–8.3)
Total Bilirubin: 1.3 mg/dL — ABNORMAL HIGH (ref 0.3–1.2)

## 2013-09-22 LAB — LIPID PANEL
CHOLESTEROL: 183 mg/dL (ref 0–200)
HDL: 57.5 mg/dL (ref >39.00)
LDL Cholesterol: 95 mg/dL (ref 0–99)
TRIGLYCERIDES: 154 mg/dL — AB (ref 0.0–149.0)
Total CHOL/HDL Ratio: 3
VLDL: 30.8 mg/dL (ref 0.0–40.0)

## 2013-09-23 ENCOUNTER — Other Ambulatory Visit: Payer: Self-pay | Admitting: General Surgery

## 2013-09-23 ENCOUNTER — Encounter: Payer: Self-pay | Admitting: General Surgery

## 2013-09-23 DIAGNOSIS — E785 Hyperlipidemia, unspecified: Secondary | ICD-10-CM

## 2013-09-23 MED ORDER — ROSUVASTATIN CALCIUM 10 MG PO TABS: 10.0000 mg | ORAL_TABLET | ORAL | Status: DC

## 2013-09-30 ENCOUNTER — Other Ambulatory Visit: Payer: Self-pay | Admitting: Dermatology

## 2013-09-30 DIAGNOSIS — D485 Neoplasm of uncertain behavior of skin: Secondary | ICD-10-CM | POA: Diagnosis not present

## 2013-09-30 DIAGNOSIS — L719 Rosacea, unspecified: Secondary | ICD-10-CM | POA: Diagnosis not present

## 2013-09-30 DIAGNOSIS — L723 Sebaceous cyst: Secondary | ICD-10-CM | POA: Diagnosis not present

## 2013-09-30 DIAGNOSIS — Z85828 Personal history of other malignant neoplasm of skin: Secondary | ICD-10-CM | POA: Diagnosis not present

## 2013-09-30 DIAGNOSIS — L259 Unspecified contact dermatitis, unspecified cause: Secondary | ICD-10-CM | POA: Diagnosis not present

## 2013-09-30 DIAGNOSIS — H61009 Unspecified perichondritis of external ear, unspecified ear: Secondary | ICD-10-CM | POA: Diagnosis not present

## 2013-10-12 DIAGNOSIS — M79609 Pain in unspecified limb: Secondary | ICD-10-CM | POA: Diagnosis not present

## 2013-10-12 DIAGNOSIS — I739 Peripheral vascular disease, unspecified: Secondary | ICD-10-CM | POA: Diagnosis not present

## 2013-10-12 DIAGNOSIS — L84 Corns and callosities: Secondary | ICD-10-CM | POA: Diagnosis not present

## 2013-10-12 DIAGNOSIS — B351 Tinea unguium: Secondary | ICD-10-CM | POA: Diagnosis not present

## 2013-11-03 ENCOUNTER — Encounter: Payer: Self-pay | Admitting: General Surgery

## 2013-11-03 ENCOUNTER — Encounter: Payer: Self-pay | Admitting: Cardiology

## 2013-11-03 ENCOUNTER — Ambulatory Visit (INDEPENDENT_AMBULATORY_CARE_PROVIDER_SITE_OTHER): Payer: Medicare Other | Admitting: Cardiology

## 2013-11-03 VITALS — BP 146/68 | HR 86 | Ht 63.0 in | Wt 182.0 lb

## 2013-11-03 DIAGNOSIS — I2510 Atherosclerotic heart disease of native coronary artery without angina pectoris: Secondary | ICD-10-CM

## 2013-11-03 DIAGNOSIS — I10 Essential (primary) hypertension: Secondary | ICD-10-CM | POA: Diagnosis not present

## 2013-11-03 DIAGNOSIS — E785 Hyperlipidemia, unspecified: Secondary | ICD-10-CM | POA: Diagnosis not present

## 2013-11-03 DIAGNOSIS — I6529 Occlusion and stenosis of unspecified carotid artery: Secondary | ICD-10-CM | POA: Diagnosis not present

## 2013-11-03 DIAGNOSIS — I6523 Occlusion and stenosis of bilateral carotid arteries: Secondary | ICD-10-CM | POA: Insufficient documentation

## 2013-11-03 LAB — BASIC METABOLIC PANEL
BUN: 12 mg/dL (ref 6–23)
CO2: 28 mEq/L (ref 19–32)
CREATININE: 0.9 mg/dL (ref 0.4–1.2)
Calcium: 9.4 mg/dL (ref 8.4–10.5)
Chloride: 104 mEq/L (ref 96–112)
GFR: 67.91 mL/min (ref >60.00)
Glucose, Bld: 101 mg/dL — ABNORMAL HIGH (ref 70–99)
Potassium: 3.8 mEq/L (ref 3.5–5.1)
Sodium: 141 mEq/L (ref 135–145)

## 2013-11-03 NOTE — Patient Instructions (Signed)
Your physician recommends that you continue on your current medications as directed. Please refer to the Current Medication list given to you today.  Your physician recommends that you go to the lab today for a BMET  Your physician wants you to follow-up in: 6 months with Dr Turner You will receive a reminder letter in the mail two months in advance. If you don't receive a letter, please call our office to schedule the follow-up appointment.  

## 2013-11-03 NOTE — Progress Notes (Signed)
[place], [address] [place], [place]  [address] Phone: [phone] Fax:  [phone]  Date:  11/03/2013   ID:  [name], DOB [birth year], MRN [id number]  PCP:  Gennette Pac, MD  Cardiologist:  Fransico Him, MD     History of Present Illness:  [name] is a 80 y.o. female with a history of ASCAD, HTN and dyslipidemia who presents today for followup. She denies any chest pain, SOB, DOE, LE edema, dizziness, palpitations or syncope.  She walks 3 times weekly in a class for exercise and some at home.    Wt Readings from Last 3 Encounters:  11/03/13 182 lb (82.555 kg)  06/20/13 185 lb 9 oz (84.171 kg)  05/29/13 182 lb (82.555 kg)     Past Medical History  Diagnosis Date  . Hyperchloremia   . Rosacea   . Diabetes mellitus     diet controlled  . Obesity   . Rosacea   . GERD (gastroesophageal reflux disease)   . Gait disorder   . Celiac sprue   . Coronary artery disease     s/p CABG with 50% stenosis at ostium of the SVG to dirst diagonal, patent LIMA to LAD and patent left circ with nonobstructive ASCAD of the RCA  . Hyperlipidemia     statin intolerant  . Hypertension   . Diverticulitis   . Meningioma   . Carotid artery occlusion     1-39% right and 40-59% left and mild subclavian artery stenosis - carotid dopplers 04/2013    Current Outpatient Prescriptions  Medication Sig Dispense Refill  . amLODipine (NORVASC) 5 MG tablet Take 5 mg by mouth 2 (two) times daily. At noon and at bedtime      . Ascorbic Acid (VITAMIN C PO) Take 1 tablet by mouth daily at 3 pm.      . aspirin EC 81 MG tablet Take 81 mg by mouth daily at 3 pm.      . Biotin 5000 MCG TABS Take 5,000 mcg by mouth daily after lunch.      . Calcium Citrate (CITRACAL PO) Take 1 tablet by mouth daily at 3 pm.       . cefUROXime (CEFTIN) 250 MG tablet Take 1 tablet (250 mg total) by mouth 2 (two) times daily with a meal.  20 tablet  0  . Cholecalciferol 4000 UNITS CAPS Take 4,000 Units by mouth  daily at 3 pm.      . cloNIDine (CATAPRES) 0.1 MG tablet Take 0.1 mg by mouth 2 (two) times daily. At lunch and at  bedtime      . furosemide (LASIX) 40 MG tablet Take 40 mg by mouth 2 (two) times daily. Morning and mid evening      . METRONIDAZOLE, TOPICAL, 0.75 % LOTN Apply 1 application topically at bedtime as needed (rosacea).      . Multiple Vitamin (MULITIVITAMIN WITH MINERALS) TABS Take 1 tablet by mouth daily at 3 pm.       . olmesartan (BENICAR) 20 MG tablet Take 20 mg by mouth 2 (two) times daily.      . pantoprazole (PROTONIX) 40 MG tablet Take 40 mg by mouth daily before breakfast.      . pyridOXINE (VITAMIN B-6) 100 MG tablet Take 100 mg by mouth once a week. On Thursday      . rosuvastatin (CRESTOR) 10 MG tablet Take 1 tablet (10 mg total) by mouth once a week. On sundays      .  vitamin B-12 (CYANOCOBALAMIN) 500 MCG tablet Take 500 mcg by mouth once a week. On Thursdays       No current facility-administered medications for this visit.    Allergies:    Allergies  Allergen Reactions  . Accutane [Isotretinoin] Other (See Comments)    Affected liver  . Ciprofloxacin Rash and Other (See Comments)    Kidney failure  . Erythromycin Diarrhea and Rash  . Gluten Diarrhea and Other (See Comments)    Celiac sprue  . Isotretinoin Other (See Comments)    Affects liver  . Lipitor [Atorvastatin] Other (See Comments)    Affects legs  . Lisinopril Other (See Comments)    Pt doesn't remember reaction   . Lovastatin Other (See Comments)    Affects legs  . Penicillins Other (See Comments)    From childhood  . Phenytoin Sodium Extended Other (See Comments)    Affects liver  . Pravastatin Other (See Comments)    Affected legs  . Crestor [Rosuvastatin]     Muscle aches (tolerates 5 mg weekly)  . Gabapentin Other (See Comments)    Shaky, swollen legs  . Prolia [Denosumab] Other (See Comments)    Joints and muscle aches, loss of hair  . Boniva [Ibandronic Acid] Other (See Comments)      headaches  . Clarithromycin Rash  . Lescol [Fluvastatin Sodium] Other (See Comments)    Affects legs  . Sulfa Antibiotics Rash  . Welchol [Colesevelam Hcl] Other (See Comments)    No energy  . Zetia [Ezetimibe] Other (See Comments)    myalgias  . Zithromax [Azithromycin] Rash    Social History:  The patient  reports that she has never smoked. She does not have any smokeless tobacco history on file. She reports that she does not drink alcohol or use illicit drugs.   Family History:  The patient's family history includes Diabetes in her mother, sister, and sister; Heart disease in her brother.   ROS:  Please see the history of present illness.      All other systems reviewed and negative.   PHYSICAL EXAM: VS:  BP 146/68  Pulse 86  Ht 5\' 3"  (1.6 m)  Wt 182 lb (82.555 kg)  BMI 32.25 kg/m2 Well nourished, well developed, in no acute distress HEENT: normal Neck: no JVD Cardiac:  normal S1, S2; RRR; no murmur Lungs:  clear to auscultation bilaterally, no wheezing, rhonchi or rales Abd: soft, nontender, no hepatomegaly Ext: no edema Skin: warm and dry Neuro:  CNs 2-12 intact, no focal abnormalities noted  EKG:  NSR, cannot rule out anterior infarct     ASSESSMENT AND PLAN:  1.  ASCAD with no angina - continue ASA  2.  HTN - controlled - continue amlodipine/Olmesartan/Clonidine  3.  Dyslipidemia - she is on Crestor once weekly but cannot tolerate any increased frequency or dosage 4.  Chronic LE edema stable on diuretics - continue Lasix  - BMET  Followup with me in 6 months.    Signed, Fransico Him, MD 11/03/2013 8:09 AM

## 2013-11-12 DIAGNOSIS — H01009 Unspecified blepharitis unspecified eye, unspecified eyelid: Secondary | ICD-10-CM | POA: Diagnosis not present

## 2013-11-12 DIAGNOSIS — H18599 Other hereditary corneal dystrophies, unspecified eye: Secondary | ICD-10-CM | POA: Diagnosis not present

## 2013-11-12 DIAGNOSIS — L719 Rosacea, unspecified: Secondary | ICD-10-CM | POA: Diagnosis not present

## 2013-11-20 DIAGNOSIS — Z96659 Presence of unspecified artificial knee joint: Secondary | ICD-10-CM | POA: Diagnosis not present

## 2013-12-01 DIAGNOSIS — I129 Hypertensive chronic kidney disease with stage 1 through stage 4 chronic kidney disease, or unspecified chronic kidney disease: Secondary | ICD-10-CM | POA: Diagnosis not present

## 2013-12-01 DIAGNOSIS — N182 Chronic kidney disease, stage 2 (mild): Secondary | ICD-10-CM | POA: Diagnosis not present

## 2013-12-03 DIAGNOSIS — H04129 Dry eye syndrome of unspecified lacrimal gland: Secondary | ICD-10-CM | POA: Diagnosis not present

## 2013-12-03 DIAGNOSIS — H01009 Unspecified blepharitis unspecified eye, unspecified eyelid: Secondary | ICD-10-CM | POA: Diagnosis not present

## 2013-12-03 DIAGNOSIS — L719 Rosacea, unspecified: Secondary | ICD-10-CM | POA: Diagnosis not present

## 2013-12-03 DIAGNOSIS — H18599 Other hereditary corneal dystrophies, unspecified eye: Secondary | ICD-10-CM | POA: Diagnosis not present

## 2014-01-04 DIAGNOSIS — L84 Corns and callosities: Secondary | ICD-10-CM | POA: Diagnosis not present

## 2014-01-04 DIAGNOSIS — L608 Other nail disorders: Secondary | ICD-10-CM | POA: Diagnosis not present

## 2014-01-04 DIAGNOSIS — I739 Peripheral vascular disease, unspecified: Secondary | ICD-10-CM | POA: Diagnosis not present

## 2014-01-08 DIAGNOSIS — L570 Actinic keratosis: Secondary | ICD-10-CM | POA: Diagnosis not present

## 2014-01-08 DIAGNOSIS — Z85828 Personal history of other malignant neoplasm of skin: Secondary | ICD-10-CM | POA: Diagnosis not present

## 2014-01-08 DIAGNOSIS — D1801 Hemangioma of skin and subcutaneous tissue: Secondary | ICD-10-CM | POA: Diagnosis not present

## 2014-01-08 DIAGNOSIS — D2370 Other benign neoplasm of skin of unspecified lower limb, including hip: Secondary | ICD-10-CM | POA: Diagnosis not present

## 2014-01-08 DIAGNOSIS — B351 Tinea unguium: Secondary | ICD-10-CM | POA: Diagnosis not present

## 2014-01-08 DIAGNOSIS — D239 Other benign neoplasm of skin, unspecified: Secondary | ICD-10-CM | POA: Diagnosis not present

## 2014-01-08 DIAGNOSIS — L723 Sebaceous cyst: Secondary | ICD-10-CM | POA: Diagnosis not present

## 2014-01-08 DIAGNOSIS — L821 Other seborrheic keratosis: Secondary | ICD-10-CM | POA: Diagnosis not present

## 2014-01-18 DIAGNOSIS — M62838 Other muscle spasm: Secondary | ICD-10-CM | POA: Diagnosis not present

## 2014-03-24 ENCOUNTER — Other Ambulatory Visit (INDEPENDENT_AMBULATORY_CARE_PROVIDER_SITE_OTHER): Payer: Medicare Other | Admitting: *Deleted

## 2014-03-24 DIAGNOSIS — E785 Hyperlipidemia, unspecified: Secondary | ICD-10-CM

## 2014-03-24 LAB — LDL CHOLESTEROL, DIRECT: Direct LDL: 133.9 mg/dL

## 2014-03-24 LAB — LIPID PANEL
CHOLESTEROL: 228 mg/dL — AB (ref 0–200)
HDL: 51.1 mg/dL (ref >39.00)
NonHDL: 176.9
Total CHOL/HDL Ratio: 4
Triglycerides: 208 mg/dL — ABNORMAL HIGH (ref 0.0–149.0)
VLDL: 41.6 mg/dL — ABNORMAL HIGH (ref 0.0–40.0)

## 2014-03-24 LAB — HEPATIC FUNCTION PANEL
ALK PHOS: 94 U/L (ref 39–117)
ALT: 19 U/L (ref 0–35)
AST: 33 U/L (ref 0–37)
Albumin: 4.1 g/dL (ref 3.5–5.2)
BILIRUBIN DIRECT: 0.1 mg/dL (ref 0.0–0.3)
TOTAL PROTEIN: 9.1 g/dL — AB (ref 6.0–8.3)
Total Bilirubin: 1.2 mg/dL (ref 0.2–1.2)

## 2014-03-26 ENCOUNTER — Telehealth: Payer: Self-pay | Admitting: Cardiology

## 2014-03-26 DIAGNOSIS — E785 Hyperlipidemia, unspecified: Secondary | ICD-10-CM

## 2014-03-26 MED ORDER — ROSUVASTATIN CALCIUM 10 MG PO TABS: 5.0000 mg | ORAL_TABLET | Freq: Every day | ORAL | Status: DC

## 2014-03-26 NOTE — Telephone Encounter (Signed)
Meds updated and labs ordered.

## 2014-03-26 NOTE — Telephone Encounter (Signed)
Message copied by Alcario Drought on Fri Mar 26, 2014  2:00 PM ------      Message from: Aris Georgia      Created: Fri Mar 26, 2014  9:45 AM       Spoke with pt. She has not been taking any Crestor for the past several months.  She is willing to restart Crestor 5mg  once daily.  Will leave samples at the front desk for pt.  Will need labs rechecked in 6 months. ------

## 2014-03-31 DIAGNOSIS — K900 Celiac disease: Secondary | ICD-10-CM | POA: Diagnosis not present

## 2014-03-31 DIAGNOSIS — N189 Chronic kidney disease, unspecified: Secondary | ICD-10-CM | POA: Diagnosis not present

## 2014-03-31 DIAGNOSIS — M4806 Spinal stenosis, lumbar region: Secondary | ICD-10-CM | POA: Diagnosis not present

## 2014-03-31 DIAGNOSIS — K219 Gastro-esophageal reflux disease without esophagitis: Secondary | ICD-10-CM | POA: Diagnosis not present

## 2014-03-31 DIAGNOSIS — Z0000 Encounter for general adult medical examination without abnormal findings: Secondary | ICD-10-CM | POA: Diagnosis not present

## 2014-03-31 DIAGNOSIS — E559 Vitamin D deficiency, unspecified: Secondary | ICD-10-CM | POA: Diagnosis not present

## 2014-03-31 DIAGNOSIS — I10 Essential (primary) hypertension: Secondary | ICD-10-CM | POA: Diagnosis not present

## 2014-03-31 DIAGNOSIS — Z23 Encounter for immunization: Secondary | ICD-10-CM | POA: Diagnosis not present

## 2014-03-31 DIAGNOSIS — E118 Type 2 diabetes mellitus with unspecified complications: Secondary | ICD-10-CM | POA: Diagnosis not present

## 2014-04-01 DIAGNOSIS — L603 Nail dystrophy: Secondary | ICD-10-CM | POA: Diagnosis not present

## 2014-04-01 DIAGNOSIS — E1151 Type 2 diabetes mellitus with diabetic peripheral angiopathy without gangrene: Secondary | ICD-10-CM | POA: Diagnosis not present

## 2014-04-01 DIAGNOSIS — L84 Corns and callosities: Secondary | ICD-10-CM | POA: Diagnosis not present

## 2014-04-01 DIAGNOSIS — I739 Peripheral vascular disease, unspecified: Secondary | ICD-10-CM | POA: Diagnosis not present

## 2014-05-03 ENCOUNTER — Encounter: Payer: Self-pay | Admitting: Cardiology

## 2014-05-03 ENCOUNTER — Ambulatory Visit (INDEPENDENT_AMBULATORY_CARE_PROVIDER_SITE_OTHER): Payer: Medicare Other | Admitting: Cardiology

## 2014-05-03 VITALS — BP 132/72 | HR 86 | Wt 182.4 lb

## 2014-05-03 DIAGNOSIS — R609 Edema, unspecified: Secondary | ICD-10-CM | POA: Diagnosis not present

## 2014-05-03 DIAGNOSIS — E785 Hyperlipidemia, unspecified: Secondary | ICD-10-CM | POA: Diagnosis not present

## 2014-05-03 DIAGNOSIS — I2510 Atherosclerotic heart disease of native coronary artery without angina pectoris: Secondary | ICD-10-CM | POA: Diagnosis not present

## 2014-05-03 DIAGNOSIS — I10 Essential (primary) hypertension: Secondary | ICD-10-CM | POA: Diagnosis not present

## 2014-05-03 DIAGNOSIS — I2583 Coronary atherosclerosis due to lipid rich plaque: Principal | ICD-10-CM

## 2014-05-03 DIAGNOSIS — R600 Localized edema: Secondary | ICD-10-CM | POA: Insufficient documentation

## 2014-05-03 NOTE — Patient Instructions (Signed)
Your physician recommends that you continue on your current medications as directed. Please refer to the Current Medication list given to you today.  Your physician wants you to follow-up in: 6 months with Dr Turner You will receive a reminder letter in the mail two months in advance. If you don't receive a letter, please call our office to schedule the follow-up appointment.  

## 2014-05-03 NOTE — Addendum Note (Signed)
Addended by: Fransico Him R on: 05/03/2014 07:09 PM   Modules accepted: Orders, Medications

## 2014-05-03 NOTE — Progress Notes (Signed)
[place], [place] [place], [place]  [address] Phone: [phone] Fax:  [phone]  Date:  05/03/2014   ID:  LAVANA Bryant, DOB May 20, 1929, MRN [id number]  PCP:  Gennette Pac, MD  Cardiologist:  Fransico Him, MD    History of Present Illness: [name] is a 80 y.o. female with a history of ASCAD, HTN and dyslipidemia who presents today for followup. She denies any chest pain, SOB, DOE, LE edema, dizziness, palpitations or syncope. She walks 3 times weekly in a class for exercise and some at home.   Wt Readings from Last 3 Encounters:  05/03/14 182 lb 6.4 oz (82.736 kg)  11/03/13 182 lb (82.555 kg)  06/20/13 185 lb 9 oz (84.171 kg)     Past Medical History  Diagnosis Date  . Hyperchloremia   . Rosacea   . Diabetes mellitus     diet controlled  . Obesity   . Rosacea   . GERD (gastroesophageal reflux disease)   . Gait disorder   . Celiac sprue   . Coronary artery disease     s/p CABG with 50% stenosis at ostium of the SVG to dirst diagonal, patent LIMA to LAD and patent left circ with nonobstructive ASCAD of the RCA  . Hyperlipidemia     statin intolerant  . Hypertension   . Diverticulitis   . Meningioma   . Carotid artery occlusion     1-39% right and 40-59% left and mild bilateral subclavian artery stenosis - carotid dopplers 04/2013    Current Outpatient Prescriptions  Medication Sig Dispense Refill  . amLODipine (NORVASC) 5 MG tablet Take 5 mg by mouth 2 (two) times daily. At noon and at bedtime    . Ascorbic Acid (VITAMIN C PO) Take 1 tablet by mouth daily at 3 pm.    . aspirin EC 81 MG tablet Take 81 mg by mouth daily at 3 pm.    . Biotin 5000 MCG TABS Take 5,000 mcg by mouth once a week.     . Calcium Citrate (CITRACAL PO) Take 1 tablet by mouth daily at 3 pm.     . Cholecalciferol 4000 UNITS CAPS Take 4,000 Units by mouth daily at 3 pm.    . cloNIDine (CATAPRES) 0.1 MG tablet Take 0.1 mg by mouth 2 (two) times daily. At lunch and at  bedtime      . furosemide (LASIX) 40 MG tablet Take 40 mg by mouth 2 (two) times daily. Morning and mid evening    . METRONIDAZOLE, TOPICAL, 0.75 % LOTN Apply 1 application topically at bedtime as needed (rosacea).    . Multiple Vitamin (MULITIVITAMIN WITH MINERALS) TABS Take 1 tablet by mouth daily at 3 pm.     . olmesartan (BENICAR) 20 MG tablet Take 20 mg by mouth 2 (two) times daily.    . pantoprazole (PROTONIX) 40 MG tablet Take 40 mg by mouth daily before breakfast.    . pyridOXINE (VITAMIN B-6) 100 MG tablet Take 100 mg by mouth once a week. On Thursday    . rosuvastatin (CRESTOR) 10 MG tablet Take 0.5 tablets (5 mg total) by mouth daily. (Patient taking differently: Take 5 mg by mouth once a week. )    . vitamin B-12 (CYANOCOBALAMIN) 500 MCG tablet Take 500 mcg by mouth once a week. On Thursdays     No current facility-administered medications for this visit.    Allergies:    Allergies  Allergen Reactions  . Accutane [Isotretinoin]  Other (See Comments)    Affected liver  . Ciprofloxacin Rash and Other (See Comments)    Kidney failure  . Erythromycin Diarrhea and Rash  . Gluten Diarrhea and Other (See Comments)    Celiac sprue  . Isotretinoin Other (See Comments)    Affects liver  . Lipitor [Atorvastatin] Other (See Comments)    Affects legs  . Lisinopril Other (See Comments)    Pt doesn't remember reaction   . Lovastatin Other (See Comments)    Affects legs  . Penicillins Other (See Comments)    From childhood  . Phenytoin Sodium Extended Other (See Comments)    Affects liver  . Pravastatin Other (See Comments)    Affected legs  . Crestor [Rosuvastatin]     Muscle aches (tolerates 5 mg weekly)  . Gabapentin Other (See Comments)    Shaky, swollen legs  . Prolia [Denosumab] Other (See Comments)    Joints and muscle aches, loss of hair  . Boniva [Ibandronic Acid] Other (See Comments)    headaches  . Clarithromycin Rash  . Lescol [Fluvastatin Sodium] Other (See Comments)     Affects legs  . Sulfa Antibiotics Rash  . Welchol [Colesevelam Hcl] Other (See Comments)    No energy  . Zetia [Ezetimibe] Other (See Comments)    myalgias  . Zithromax [Azithromycin] Rash    Social History:  The patient  reports that she has never smoked. She does not have any smokeless tobacco history on file. She reports that she does not drink alcohol or use illicit drugs.   Family History:  The patient's family history includes Diabetes in her mother, sister, and sister; Heart disease in her brother.   ROS:  Please see the history of present illness.      All other systems reviewed and negative.   PHYSICAL EXAM: VS:  BP 132/72 mmHg  Pulse 86  Wt 182 lb 6.4 oz (82.736 kg)  SpO2 99% Well nourished, well developed, in no acute distress HEENT: normal Neck: no JVD Cardiac:  normal S1, S2; RRR; no murmur Lungs:  clear to auscultation bilaterally, no wheezing, rhonchi or rales Abd: soft, nontender, no hepatomegaly Ext: trace edema Skin: warm and dry Neuro:  CNs 2-12 intact, no focal abnormalities noted  ASSESSMENT AND PLAN:  1. ASCAD with no angina - continue ASA  2. HTN - controlled - continue amlodipine/Olmesartan/Clonidine  3. Dyslipidemia - she is on Crestor once weekly but cannot tolerate any increased frequency or dosage.  Recheck FLP and ALT in 6 months 4. Chronic LE edema stable on diuretics - continue Lasix   Followup with me in 6 months.       Signed, Fransico Him, MD [facility] 05/03/2014 8:13 AM

## 2014-05-06 DIAGNOSIS — R209 Unspecified disturbances of skin sensation: Secondary | ICD-10-CM | POA: Diagnosis not present

## 2014-05-20 DIAGNOSIS — Z23 Encounter for immunization: Secondary | ICD-10-CM | POA: Diagnosis not present

## 2014-06-01 ENCOUNTER — Encounter: Payer: Self-pay | Admitting: Cardiology

## 2014-06-01 ENCOUNTER — Ambulatory Visit (HOSPITAL_COMMUNITY): Payer: Medicare Other | Attending: Family Medicine | Admitting: Cardiology

## 2014-06-01 DIAGNOSIS — I6523 Occlusion and stenosis of bilateral carotid arteries: Secondary | ICD-10-CM

## 2014-06-01 DIAGNOSIS — I2510 Atherosclerotic heart disease of native coronary artery without angina pectoris: Secondary | ICD-10-CM | POA: Diagnosis not present

## 2014-06-01 NOTE — Progress Notes (Signed)
Carotid duplex performed 

## 2014-06-02 ENCOUNTER — Other Ambulatory Visit: Payer: Self-pay

## 2014-06-02 DIAGNOSIS — I6523 Occlusion and stenosis of bilateral carotid arteries: Secondary | ICD-10-CM

## 2014-06-03 DIAGNOSIS — H40013 Open angle with borderline findings, low risk, bilateral: Secondary | ICD-10-CM | POA: Diagnosis not present

## 2014-06-03 DIAGNOSIS — H04123 Dry eye syndrome of bilateral lacrimal glands: Secondary | ICD-10-CM | POA: Diagnosis not present

## 2014-06-03 DIAGNOSIS — E119 Type 2 diabetes mellitus without complications: Secondary | ICD-10-CM | POA: Diagnosis not present

## 2014-06-03 DIAGNOSIS — L718 Other rosacea: Secondary | ICD-10-CM | POA: Diagnosis not present

## 2014-06-03 DIAGNOSIS — H1859 Other hereditary corneal dystrophies: Secondary | ICD-10-CM | POA: Diagnosis not present

## 2014-06-04 DIAGNOSIS — I129 Hypertensive chronic kidney disease with stage 1 through stage 4 chronic kidney disease, or unspecified chronic kidney disease: Secondary | ICD-10-CM | POA: Diagnosis not present

## 2014-06-04 DIAGNOSIS — N182 Chronic kidney disease, stage 2 (mild): Secondary | ICD-10-CM | POA: Diagnosis not present

## 2014-06-17 DIAGNOSIS — E876 Hypokalemia: Secondary | ICD-10-CM | POA: Diagnosis not present

## 2014-06-17 DIAGNOSIS — E118 Type 2 diabetes mellitus with unspecified complications: Secondary | ICD-10-CM | POA: Diagnosis not present

## 2014-06-17 DIAGNOSIS — R799 Abnormal finding of blood chemistry, unspecified: Secondary | ICD-10-CM | POA: Diagnosis not present

## 2014-06-17 DIAGNOSIS — E039 Hypothyroidism, unspecified: Secondary | ICD-10-CM | POA: Diagnosis not present

## 2014-06-24 DIAGNOSIS — I739 Peripheral vascular disease, unspecified: Secondary | ICD-10-CM | POA: Diagnosis not present

## 2014-06-24 DIAGNOSIS — L603 Nail dystrophy: Secondary | ICD-10-CM | POA: Diagnosis not present

## 2014-06-24 DIAGNOSIS — L84 Corns and callosities: Secondary | ICD-10-CM | POA: Diagnosis not present

## 2014-06-24 DIAGNOSIS — E1151 Type 2 diabetes mellitus with diabetic peripheral angiopathy without gangrene: Secondary | ICD-10-CM | POA: Diagnosis not present

## 2014-08-04 ENCOUNTER — Encounter: Payer: Self-pay | Admitting: Cardiology

## 2014-08-09 ENCOUNTER — Other Ambulatory Visit: Payer: Self-pay

## 2014-08-09 DIAGNOSIS — Z1231 Encounter for screening mammogram for malignant neoplasm of breast: Secondary | ICD-10-CM

## 2014-09-13 ENCOUNTER — Ambulatory Visit
Admission: RE | Admit: 2014-09-13 | Discharge: 2014-09-13 | Disposition: A | Discharge status: Home / Self Care | Payer: Medicare Other | Source: Ambulatory Visit

## 2014-09-13 DIAGNOSIS — Z1231 Encounter for screening mammogram for malignant neoplasm of breast: Secondary | ICD-10-CM | POA: Diagnosis not present

## 2014-09-14 DIAGNOSIS — K219 Gastro-esophageal reflux disease without esophagitis: Secondary | ICD-10-CM | POA: Diagnosis not present

## 2014-09-15 DIAGNOSIS — E118 Type 2 diabetes mellitus with unspecified complications: Secondary | ICD-10-CM | POA: Diagnosis not present

## 2014-09-15 DIAGNOSIS — R209 Unspecified disturbances of skin sensation: Secondary | ICD-10-CM | POA: Diagnosis not present

## 2014-09-15 DIAGNOSIS — E039 Hypothyroidism, unspecified: Secondary | ICD-10-CM | POA: Diagnosis not present

## 2014-09-15 DIAGNOSIS — E876 Hypokalemia: Secondary | ICD-10-CM | POA: Diagnosis not present

## 2014-09-16 DIAGNOSIS — I739 Peripheral vascular disease, unspecified: Secondary | ICD-10-CM | POA: Diagnosis not present

## 2014-09-16 DIAGNOSIS — E1151 Type 2 diabetes mellitus with diabetic peripheral angiopathy without gangrene: Secondary | ICD-10-CM | POA: Diagnosis not present

## 2014-09-16 DIAGNOSIS — L603 Nail dystrophy: Secondary | ICD-10-CM | POA: Diagnosis not present

## 2014-09-16 DIAGNOSIS — L84 Corns and callosities: Secondary | ICD-10-CM | POA: Diagnosis not present

## 2014-09-17 DIAGNOSIS — D729 Disorder of white blood cells, unspecified: Secondary | ICD-10-CM | POA: Diagnosis not present

## 2014-09-17 DIAGNOSIS — E039 Hypothyroidism, unspecified: Secondary | ICD-10-CM | POA: Diagnosis not present

## 2014-09-27 ENCOUNTER — Other Ambulatory Visit (INDEPENDENT_AMBULATORY_CARE_PROVIDER_SITE_OTHER): Payer: Medicare Other | Admitting: *Deleted

## 2014-09-27 DIAGNOSIS — E785 Hyperlipidemia, unspecified: Secondary | ICD-10-CM | POA: Diagnosis not present

## 2014-09-27 LAB — LIPID PANEL
CHOL/HDL RATIO: 4
CHOLESTEROL: 212 mg/dL — AB (ref 0–200)
HDL: 52.5 mg/dL (ref >39.00)
LDL CALC: 120 mg/dL — AB (ref 0–99)
NonHDL: 159.5
Triglycerides: 199 mg/dL — ABNORMAL HIGH (ref 0.0–149.0)
VLDL: 39.8 mg/dL (ref 0.0–40.0)

## 2014-09-27 LAB — HEPATIC FUNCTION PANEL
ALT: 13 U/L (ref 0–35)
AST: 20 U/L (ref 0–37)
Albumin: 4.4 g/dL (ref 3.5–5.2)
Alkaline Phosphatase: 108 U/L (ref 39–117)
Bilirubin, Direct: 0.2 mg/dL (ref 0.0–0.3)
Total Bilirubin: 1.1 mg/dL (ref 0.2–1.2)
Total Protein: 9 g/dL — ABNORMAL HIGH (ref 6.0–8.3)

## 2014-09-30 ENCOUNTER — Telehealth: Payer: Self-pay | Admitting: Cardiology

## 2014-09-30 DIAGNOSIS — E785 Hyperlipidemia, unspecified: Secondary | ICD-10-CM

## 2014-09-30 NOTE — Telephone Encounter (Signed)
Follow up  Pt returning Katy's phone call. Please call back and discuss.  

## 2014-09-30 NOTE — Telephone Encounter (Signed)
Patient agrees with recommendation for Lipid Clinic.  Referral placed for scheduling.

## 2014-09-30 NOTE — Telephone Encounter (Signed)
-----   Message from Sueanne Margarita, MD sent at 09/28/2014  7:43 PM EDT ----- Refer to lipid clinic

## 2014-10-04 ENCOUNTER — Telehealth: Payer: Self-pay | Admitting: Pharmacist

## 2014-10-04 NOTE — Telephone Encounter (Signed)
Pt was scheduled to see the Lipid Clinic on 4/19.  Reviewed labs and past history.  She saw Alferd Apa, PharmD in December 2014 and at that time, her only option was Crestor 5mg  a week.  This did lower pt's LDL ~20 pts but she continues to have some leg pain.  Given this and her list of intolerances, there are really no other options for patient at this time.  She does not qualify for [facility].  Spoke with pt.  She is aware of recommendations and that her appointment for tomorrow has been canceled.  I will mail her a copy of her results.

## 2014-10-05 ENCOUNTER — Ambulatory Visit: Payer: Medicare Other | Admitting: Pharmacist

## 2014-11-02 ENCOUNTER — Encounter: Payer: Self-pay | Admitting: Cardiology

## 2014-11-02 ENCOUNTER — Ambulatory Visit (INDEPENDENT_AMBULATORY_CARE_PROVIDER_SITE_OTHER): Payer: Medicare Other | Admitting: Cardiology

## 2014-11-02 ENCOUNTER — Telehealth: Payer: Self-pay

## 2014-11-02 VITALS — BP 178/68 | HR 89 | Ht 63.0 in | Wt 185.1 lb

## 2014-11-02 DIAGNOSIS — I2510 Atherosclerotic heart disease of native coronary artery without angina pectoris: Secondary | ICD-10-CM

## 2014-11-02 DIAGNOSIS — I6523 Occlusion and stenosis of bilateral carotid arteries: Secondary | ICD-10-CM | POA: Diagnosis not present

## 2014-11-02 DIAGNOSIS — R609 Edema, unspecified: Secondary | ICD-10-CM

## 2014-11-02 DIAGNOSIS — I2583 Coronary atherosclerosis due to lipid rich plaque: Principal | ICD-10-CM

## 2014-11-02 DIAGNOSIS — R600 Localized edema: Secondary | ICD-10-CM

## 2014-11-02 DIAGNOSIS — I10 Essential (primary) hypertension: Secondary | ICD-10-CM

## 2014-11-02 DIAGNOSIS — E785 Hyperlipidemia, unspecified: Secondary | ICD-10-CM

## 2014-11-02 LAB — BASIC METABOLIC PANEL
BUN: 20 mg/dL (ref 6–23)
CO2: 29 mEq/L (ref 19–32)
Calcium: 9.8 mg/dL (ref 8.4–10.5)
Chloride: 100 mEq/L (ref 96–112)
Creatinine, Ser: 1.21 mg/dL — ABNORMAL HIGH (ref 0.40–1.20)
GFR: 45.07 mL/min — ABNORMAL LOW (ref >60.00)
Glucose, Bld: 127 mg/dL — ABNORMAL HIGH (ref 70–99)
Potassium: 3.8 mEq/L (ref 3.5–5.1)
SODIUM: 137 meq/L (ref 135–145)

## 2014-11-02 MED ORDER — ROSUVASTATIN CALCIUM 10 MG PO TABS: 5.0000 mg | ORAL_TABLET | ORAL | Status: DC

## 2014-11-02 NOTE — Progress Notes (Signed)
Cardiology Office Note   Date:  11/02/2014   ID:  [name], DOB Oct 01, 1928, MRN [id number]  PCP:  Gennette Pac, MD    Chief Complaint  Patient presents with  . Follow-up    CAD      History of Present Illness: [name] is a 81y.o. female with a history of ASCAD, HTN and dyslipidemia who presents today for followup. She denies any chest pain, SOB, DOE,  dizziness, palpitations or syncope. She has chronic LE edema which is controlled on the diuretic and compression hose.  She walks 3 times weekly in a class for exercise and some at home.     Past Medical History  Diagnosis Date  . Hyperchloremia   . Rosacea   . Diabetes mellitus     diet controlled  . Obesity   . Rosacea   . GERD (gastroesophageal reflux disease)   . Gait disorder   . Celiac sprue   . Coronary artery disease     s/p CABG with 50% stenosis at ostium of the SVG to dirst diagonal, patent LIMA to LAD and patent left circ with nonobstructive ASCAD of the RCA  . Hyperlipidemia     statin intolerant  . Hypertension   . Diverticulitis   . Meningioma   . Carotid artery occlusion     1-39% right and 40-59% left and mild bilateral subclavian artery stenosis - carotid dopplers 04/2013    Past Surgical History  Procedure Laterality Date  . Abdominal hysterectomy    . Brain tumor excision  1993    meningioma, left frontal  . Joint replacement  2009  . Coronary artery bypass graft  2002     3 vessel  . Rotator cuff repair  2010    Dr. Gladstone Lighter  . Bunionectomy    . Replacement total knee    . Tonsillectomy    . Cardiac catheterization       Current Outpatient Prescriptions  Medication Sig Dispense Refill  . amLODipine (NORVASC) 5 MG tablet Take 5 mg by mouth 2 (two) times daily. At noon and at bedtime    . Ascorbic Acid (VITAMIN C PO) Take 1 tablet by mouth daily at 3 pm.    . aspirin EC 81 MG tablet Take 81 mg by mouth daily at 3 pm.    . BENICAR 40 MG tablet   1  . Biotin 5000 MCG  TABS Take 5,000 mcg by mouth once a week.     . Calcium Citrate (CITRACAL PO) Take 1 tablet by mouth daily at 3 pm.     . Cholecalciferol 4000 UNITS CAPS Take 4,000 Units by mouth daily at 3 pm.    . clindamycin (CLEOCIN) 150 MG capsule Take 150 mg by mouth as needed. Before dentist appointment    . cloNIDine (CATAPRES) 0.1 MG tablet Take 0.1 mg by mouth 2 (two) times daily. At lunch and at  bedtime    . furosemide (LASIX) 40 MG tablet Take 40 mg by mouth 2 (two) times daily. Morning and mid evening    . levothyroxine (SYNTHROID, LEVOTHROID) 25 MCG tablet Take 25 mg by mouth daily.    Marland Kitchen METRONIDAZOLE, TOPICAL, 0.75 % LOTN Apply 1 application topically at bedtime as needed (rosacea).    . Multiple Vitamin (MULITIVITAMIN WITH MINERALS) TABS Take 1 tablet by mouth daily at 3 pm.     . pantoprazole (PROTONIX) 40 MG tablet Take 40 mg by mouth daily before breakfast.    .  potassium chloride (KLOR-CON) 8 MEQ tablet Take 8 mEq by mouth.  11  . pyridOXINE (VITAMIN B-6) 100 MG tablet Take 100 mg by mouth once a week. On Thursday    . rosuvastatin (CRESTOR) 10 MG tablet Take 0.5 tablets (5 mg total) by mouth daily. (Patient taking differently: Take 5 mg by mouth once a week. )    . vitamin B-12 (CYANOCOBALAMIN) 500 MCG tablet Take 500 mcg by mouth once a week. On Thursdays     No current facility-administered medications for this visit.    Allergies:   Accutane; Ciprofloxacin; Erythromycin; Gluten; Isotretinoin; Lipitor; Lisinopril; Lovastatin; Penicillins; Phenytoin sodium extended; Pravastatin; Crestor; Gabapentin; Prolia; Boniva; Clarithromycin; Lescol; Sulfa antibiotics; Welchol; Zetia; and Zithromax    Social History:  The patient  reports that she has never smoked. She does not have any smokeless tobacco history on file. She reports that she does not drink alcohol or use illicit drugs.   Family History:  The patient's family history includes Diabetes in her mother, sister, and sister; Heart disease  in her brother.    ROS:  Please see the history of present illness.   Otherwise, review of systems are positive for none.   All other systems are reviewed and negative.    PHYSICAL EXAM: VS:  BP 178/68 mmHg  Pulse 89  Ht 5\' 3"  (1.6 m)  Wt 185 lb 1.9 oz (83.97 kg)  BMI 32.80 kg/m2 , BMI Body mass index is 32.8 kg/(m^2). GEN: Well nourished, well developed, in no acute distress HEENT: normal Neck: no JVD, carotid bruits, or masses Cardiac: RRR; no murmurs, rubs, or gallops.  Trace edema  Respiratory:  clear to auscultation bilaterally, normal work of breathing GI: soft, nontender, nondistended, + BS MS: no deformity or atrophy Skin: warm and dry, no rash Neuro:  Strength and sensation are intact Psych: euthymic mood, full affect   EKG:  EKG is not ordered today. The ekg ordered today demonstrates NSR at 89bpm with no ST changes   Recent Labs: 11/03/2013: BUN 12; Creatinine 0.9; Potassium 3.8; Sodium 141 09/27/2014: ALT 13    Lipid Panel    Component Value Date/Time   CHOL 212* 09/27/2014 0947   TRIG 199.0* 09/27/2014 0947   HDL 52.50 09/27/2014 0947   CHOLHDL 4 09/27/2014 0947   VLDL 39.8 09/27/2014 0947   LDLCALC 120* 09/27/2014 0947   LDLDIRECT 133.9 03/24/2014 0824      Wt Readings from Last 3 Encounters:  11/02/14 185 lb 1.9 oz (83.97 kg)  05/03/14 182 lb 6.4 oz (82.736 kg)  11/03/13 182 lb (82.555 kg)     ASSESSMENT AND PLAN:  1. ASCAD with no angina - continue ASA  2. HTN - borderline controlled but she brought in her readings from home which range from 126-145/70-62mmHg.   - continue amlodipine/Olmesartan/Clonidine  3. Dyslipidemia - she is on Crestor once weekly but cannot tolerate any increased frequency or dosage.  4. Chronic LE edema stable on diuretics - continue Lasix  - check BMET  Current medicines are reviewed at length with the patient today.  The patient does not have concerns regarding medicines.  The following changes have been  made:  no change  Labs/ tests ordered today include: see above assessment and plan No orders of the defined types were placed in this encounter.     Disposition:   FU with me in 6 months   Signed, Sueanne Margarita, MD  11/02/2014 10:10 AM    [facility]  [address], [place], [place]  [address] Phone: [phone]; Fax: [phone]    

## 2014-11-02 NOTE — Telephone Encounter (Signed)
-----   Message from Sueanne Margarita, MD sent at 11/02/2014  3:14 PM EDT ----- Stable labs - continue current meds

## 2014-11-02 NOTE — Patient Instructions (Signed)

## 2014-11-02 NOTE — Telephone Encounter (Signed)
Informed patient of results and verbal understanding expressed.   Patient requests new Rx for Crestor. Rx sent to pharmacy of choice.

## 2014-11-04 DIAGNOSIS — E119 Type 2 diabetes mellitus without complications: Secondary | ICD-10-CM | POA: Diagnosis not present

## 2014-11-04 DIAGNOSIS — H179 Unspecified corneal scar and opacity: Secondary | ICD-10-CM | POA: Diagnosis not present

## 2014-11-04 DIAGNOSIS — H1859 Other hereditary corneal dystrophies: Secondary | ICD-10-CM | POA: Diagnosis not present

## 2014-11-04 DIAGNOSIS — H40013 Open angle with borderline findings, low risk, bilateral: Secondary | ICD-10-CM | POA: Diagnosis not present

## 2014-11-10 DIAGNOSIS — E039 Hypothyroidism, unspecified: Secondary | ICD-10-CM | POA: Diagnosis not present

## 2014-11-10 DIAGNOSIS — D729 Disorder of white blood cells, unspecified: Secondary | ICD-10-CM | POA: Diagnosis not present

## 2014-11-19 DIAGNOSIS — M25561 Pain in right knee: Secondary | ICD-10-CM | POA: Diagnosis not present

## 2014-11-19 DIAGNOSIS — Z471 Aftercare following joint replacement surgery: Secondary | ICD-10-CM | POA: Diagnosis not present

## 2014-11-19 DIAGNOSIS — M4307 Spondylolysis, lumbosacral region: Secondary | ICD-10-CM | POA: Diagnosis not present

## 2014-11-19 DIAGNOSIS — Z96651 Presence of right artificial knee joint: Secondary | ICD-10-CM | POA: Diagnosis not present

## 2014-12-06 DIAGNOSIS — N182 Chronic kidney disease, stage 2 (mild): Secondary | ICD-10-CM | POA: Diagnosis not present

## 2014-12-06 DIAGNOSIS — I129 Hypertensive chronic kidney disease with stage 1 through stage 4 chronic kidney disease, or unspecified chronic kidney disease: Secondary | ICD-10-CM | POA: Diagnosis not present

## 2014-12-15 DIAGNOSIS — E118 Type 2 diabetes mellitus with unspecified complications: Secondary | ICD-10-CM | POA: Diagnosis not present

## 2014-12-16 DIAGNOSIS — E1151 Type 2 diabetes mellitus with diabetic peripheral angiopathy without gangrene: Secondary | ICD-10-CM | POA: Diagnosis not present

## 2014-12-16 DIAGNOSIS — L603 Nail dystrophy: Secondary | ICD-10-CM | POA: Diagnosis not present

## 2014-12-16 DIAGNOSIS — I739 Peripheral vascular disease, unspecified: Secondary | ICD-10-CM | POA: Diagnosis not present

## 2014-12-24 DIAGNOSIS — L820 Inflamed seborrheic keratosis: Secondary | ICD-10-CM | POA: Diagnosis not present

## 2014-12-24 DIAGNOSIS — L821 Other seborrheic keratosis: Secondary | ICD-10-CM | POA: Diagnosis not present

## 2014-12-24 DIAGNOSIS — Z85828 Personal history of other malignant neoplasm of skin: Secondary | ICD-10-CM | POA: Diagnosis not present

## 2014-12-24 DIAGNOSIS — L720 Epidermal cyst: Secondary | ICD-10-CM | POA: Diagnosis not present

## 2014-12-24 DIAGNOSIS — L570 Actinic keratosis: Secondary | ICD-10-CM | POA: Diagnosis not present

## 2014-12-24 DIAGNOSIS — L723 Sebaceous cyst: Secondary | ICD-10-CM | POA: Diagnosis not present

## 2015-01-13 DIAGNOSIS — K5790 Diverticulosis of intestine, part unspecified, without perforation or abscess without bleeding: Secondary | ICD-10-CM | POA: Diagnosis not present

## 2015-01-13 DIAGNOSIS — R109 Unspecified abdominal pain: Secondary | ICD-10-CM | POA: Diagnosis not present

## 2015-01-13 DIAGNOSIS — M5136 Other intervertebral disc degeneration, lumbar region: Secondary | ICD-10-CM | POA: Diagnosis not present

## 2015-01-13 DIAGNOSIS — Z8719 Personal history of other diseases of the digestive system: Secondary | ICD-10-CM | POA: Diagnosis not present

## 2015-01-25 ENCOUNTER — Other Ambulatory Visit: Payer: Self-pay | Admitting: Family Medicine

## 2015-01-25 ENCOUNTER — Ambulatory Visit
Admission: RE | Admit: 2015-01-25 | Discharge: 2015-01-25 | Disposition: A | Discharge status: Home / Self Care | Payer: Medicare Other | Source: Ambulatory Visit | Attending: Family Medicine | Admitting: Family Medicine

## 2015-01-25 DIAGNOSIS — R0789 Other chest pain: Secondary | ICD-10-CM | POA: Diagnosis not present

## 2015-01-25 DIAGNOSIS — M545 Low back pain, unspecified: Secondary | ICD-10-CM

## 2015-01-25 DIAGNOSIS — R109 Unspecified abdominal pain: Secondary | ICD-10-CM | POA: Diagnosis not present

## 2015-01-25 DIAGNOSIS — R0781 Pleurodynia: Secondary | ICD-10-CM | POA: Diagnosis not present

## 2015-02-22 DIAGNOSIS — R1011 Right upper quadrant pain: Secondary | ICD-10-CM | POA: Diagnosis not present

## 2015-03-08 DIAGNOSIS — M25571 Pain in right ankle and joints of right foot: Secondary | ICD-10-CM | POA: Diagnosis not present

## 2015-03-10 DIAGNOSIS — L820 Inflamed seborrheic keratosis: Secondary | ICD-10-CM | POA: Diagnosis not present

## 2015-03-10 DIAGNOSIS — L821 Other seborrheic keratosis: Secondary | ICD-10-CM | POA: Diagnosis not present

## 2015-03-10 DIAGNOSIS — Z85828 Personal history of other malignant neoplasm of skin: Secondary | ICD-10-CM | POA: Diagnosis not present

## 2015-03-10 DIAGNOSIS — L570 Actinic keratosis: Secondary | ICD-10-CM | POA: Diagnosis not present

## 2015-03-17 DIAGNOSIS — L603 Nail dystrophy: Secondary | ICD-10-CM | POA: Diagnosis not present

## 2015-03-17 DIAGNOSIS — I739 Peripheral vascular disease, unspecified: Secondary | ICD-10-CM | POA: Diagnosis not present

## 2015-03-17 DIAGNOSIS — E1151 Type 2 diabetes mellitus with diabetic peripheral angiopathy without gangrene: Secondary | ICD-10-CM | POA: Diagnosis not present

## 2015-03-17 DIAGNOSIS — L84 Corns and callosities: Secondary | ICD-10-CM | POA: Diagnosis not present

## 2015-04-15 DIAGNOSIS — E039 Hypothyroidism, unspecified: Secondary | ICD-10-CM | POA: Diagnosis not present

## 2015-04-15 DIAGNOSIS — E1122 Type 2 diabetes mellitus with diabetic chronic kidney disease: Secondary | ICD-10-CM | POA: Diagnosis not present

## 2015-04-15 DIAGNOSIS — E559 Vitamin D deficiency, unspecified: Secondary | ICD-10-CM | POA: Diagnosis not present

## 2015-04-15 DIAGNOSIS — I10 Essential (primary) hypertension: Secondary | ICD-10-CM | POA: Diagnosis not present

## 2015-04-15 DIAGNOSIS — E118 Type 2 diabetes mellitus with unspecified complications: Secondary | ICD-10-CM | POA: Diagnosis not present

## 2015-04-15 DIAGNOSIS — E7800 Pure hypercholesterolemia, unspecified: Secondary | ICD-10-CM | POA: Diagnosis not present

## 2015-04-15 DIAGNOSIS — R778 Other specified abnormalities of plasma proteins: Secondary | ICD-10-CM | POA: Diagnosis not present

## 2015-04-15 DIAGNOSIS — M810 Age-related osteoporosis without current pathological fracture: Secondary | ICD-10-CM | POA: Diagnosis not present

## 2015-04-15 DIAGNOSIS — I2510 Atherosclerotic heart disease of native coronary artery without angina pectoris: Secondary | ICD-10-CM | POA: Diagnosis not present

## 2015-04-15 DIAGNOSIS — E1165 Type 2 diabetes mellitus with hyperglycemia: Secondary | ICD-10-CM | POA: Diagnosis not present

## 2015-04-15 DIAGNOSIS — Z23 Encounter for immunization: Secondary | ICD-10-CM | POA: Diagnosis not present

## 2015-04-15 DIAGNOSIS — K900 Celiac disease: Secondary | ICD-10-CM | POA: Diagnosis not present

## 2015-04-15 DIAGNOSIS — Z0000 Encounter for general adult medical examination without abnormal findings: Secondary | ICD-10-CM | POA: Diagnosis not present

## 2015-05-02 NOTE — Progress Notes (Signed)
Cardiology Office Note   Date:  05/03/2015   ID:  [name], DOB June 15, 1929, MRN [id number]  PCP:  Gennette Pac, MD    Chief Complaint  Patient presents with  . Coronary Artery Disease  . Hypertension      History of Present Illness: [name] is a 81y.o. female with a history of ASCAD, HTN and dyslipidemia who presents today for followup. She denies any chest pain, SOB, DOE, dizziness, palpitations or syncope. She has chronic LE edema which is controlled on the diuretic and compression hose. She walks 3 times weekly in a class for exercise and some at home.       Past Medical History  Diagnosis Date  . Hyperchloremia   . Rosacea   . Diabetes mellitus     diet controlled  . Obesity   . Rosacea   . GERD (gastroesophageal reflux disease)   . Gait disorder   . Celiac sprue   . Coronary artery disease     s/p CABG with 50% stenosis at ostium of the SVG to dirst diagonal, patent LIMA to LAD and patent left circ with nonobstructive ASCAD of the RCA  . Hyperlipidemia     statin intolerant  . Hypertension   . Diverticulitis   . Meningioma ([place])   . Carotid artery occlusion     1-39% right and 40-59% left and mild bilateral subclavian artery stenosis - carotid dopplers 04/2013    Past Surgical History  Procedure Laterality Date  . Abdominal hysterectomy    . Brain tumor excision  1993    meningioma, left frontal  . Joint replacement  2009  . Coronary artery bypass graft  2002     3 vessel  . Rotator cuff repair  2010    Dr. Gladstone Lighter  . Bunionectomy    . Replacement total knee    . Tonsillectomy    . Cardiac catheterization       Current Outpatient Prescriptions  Medication Sig Dispense Refill  . amLODipine (NORVASC) 5 MG tablet Take 5 mg by mouth 2 (two) times daily. At noon and at bedtime    . Ascorbic Acid (VITAMIN C PO) Take 1 tablet by mouth daily at 3 pm.    . aspirin EC 81 MG tablet Take 81 mg by mouth daily at 3 pm.      . BENICAR 40 MG tablet   1  . Biotin 5000 MCG TABS Take 5,000 mcg by mouth once a week.     . Calcium Citrate (CITRACAL PO) Take 1 tablet by mouth daily at 3 pm.     . Cholecalciferol 4000 UNITS CAPS Take 4,000 Units by mouth daily at 3 pm.    . clindamycin (CLEOCIN) 150 MG capsule Take 150 mg by mouth as needed. Before dentist appointment    . cloNIDine (CATAPRES) 0.1 MG tablet Take 0.1 mg by mouth 2 (two) times daily. At lunch and at  bedtime    . furosemide (LASIX) 40 MG tablet Take 40 mg by mouth 2 (two) times daily. Morning and mid evening    . levothyroxine (SYNTHROID, LEVOTHROID) 25 MCG tablet Take 25 mg by mouth daily.    Marland Kitchen METRONIDAZOLE, TOPICAL, 0.75 % LOTN Apply 1 application topically at bedtime as needed (rosacea).    . Multiple Vitamin (MULITIVITAMIN WITH MINERALS) TABS Take 1 tablet by mouth daily at 3 pm.     .  pantoprazole (PROTONIX) 40 MG tablet Take 40 mg by mouth daily before breakfast.    . potassium chloride (KLOR-CON) 8 MEQ tablet Take 8 mEq by mouth.  11  . pyridOXINE (VITAMIN B-6) 100 MG tablet Take 100 mg by mouth once a week. On Thursday    . rosuvastatin (CRESTOR) 10 MG tablet Take 0.5 tablets (5 mg total) by mouth once a week. 3 tablet 6  . vitamin B-12 (CYANOCOBALAMIN) 500 MCG tablet Take 500 mcg by mouth once a week. On Thursdays     No current facility-administered medications for this visit.    Allergies:   Accutane; Ciprofloxacin; Erythromycin; Gluten; Isotretinoin; Lipitor; Lisinopril; Lovastatin; Penicillins; Phenytoin sodium extended; Pravastatin; Crestor; Gabapentin; Prolia; Boniva; Clarithromycin; Lescol; Sulfa antibiotics; Welchol; Zetia; and Zithromax    Social History:  The patient  reports that she has never smoked. She does not have any smokeless tobacco history on file. She reports that she does not drink alcohol or use illicit drugs.   Family History:  The patient's family history includes Diabetes in her mother, sister, and sister; Heart  disease in her brother.    ROS:  Please see the history of present illness.   Otherwise, review of systems are positive for none.   All other systems are reviewed and negative.    PHYSICAL EXAM: VS:  BP 142/68 mmHg  Pulse 88  Ht 5\' 3"  (1.6 m)  Wt 84.097 kg (185 lb 6.4 oz)  BMI 32.85 kg/m2 , BMI Body mass index is 32.85 kg/(m^2). GEN: Well nourished, well developed, in no acute distress HEENT: normal Neck: no JVD, carotid bruits, or masses Cardiac: RRR; no murmurs, rubs, or gallops,no edema  Respiratory:  clear to auscultation bilaterally, normal work of breathing GI: soft, nontender, nondistended, + BS MS: no deformity or atrophy Skin: warm and dry, no rash Neuro:  Strength and sensation are intact Psych: euthymic mood, full affect   EKG:  EKG is not ordered today.    Recent Labs: 09/27/2014: ALT 13 11/02/2014: BUN 20; Creatinine, Ser 1.21*; Potassium 3.8; Sodium 137    Lipid Panel    Component Value Date/Time   CHOL 212* 09/27/2014 0947   TRIG 199.0* 09/27/2014 0947   HDL 52.50 09/27/2014 0947   CHOLHDL 4 09/27/2014 0947   VLDL 39.8 09/27/2014 0947   LDLCALC 120* 09/27/2014 0947   LDLDIRECT 133.9 03/24/2014 0824      Wt Readings from Last 3 Encounters:  05/03/15 84.097 kg (185 lb 6.4 oz)  11/02/14 83.97 kg (185 lb 1.9 oz)  05/03/14 82.736 kg (182 lb 6.4 oz)     ASSESSMENT AND PLAN:  1. ASCAD with no angina - continue ASA  2. HTN - controlled  - continue amlodipine/Olmesartan/Clonidine  3. Dyslipidemia - she is on Crestor once weekly but cannot tolerate any increased frequency or dosage. I will get a copy of lipids from last month at PCP 4. Chronic LE edema stable on diuretics - continue Lasix  - check BMET from PCP done last month 5.  Bilateral carotid artery stenosis 1-39% - repeat study and continue ASA   Current medicines are reviewed at length with the patient today.  The patient does not have concerns regarding medicines.  The following  changes have been made:  no change  Labs/ tests ordered today: See above Assessment and Plan No orders of the defined types were placed in this encounter.     Disposition:   FU with me in 6 months  Signed, Sueanne Margarita, MD  05/03/2015 9:15 AM    [place], [place], [place]  [address] Phone: [phone]; Fax: [phone]

## 2015-05-03 ENCOUNTER — Ambulatory Visit (INDEPENDENT_AMBULATORY_CARE_PROVIDER_SITE_OTHER): Payer: Medicare Other | Admitting: Cardiology

## 2015-05-03 ENCOUNTER — Encounter: Payer: Self-pay | Admitting: Cardiology

## 2015-05-03 VITALS — BP 142/68 | HR 88 | Ht 63.0 in | Wt 185.4 lb

## 2015-05-03 DIAGNOSIS — I6523 Occlusion and stenosis of bilateral carotid arteries: Secondary | ICD-10-CM

## 2015-05-03 DIAGNOSIS — I2583 Coronary atherosclerosis due to lipid rich plaque: Principal | ICD-10-CM

## 2015-05-03 DIAGNOSIS — E785 Hyperlipidemia, unspecified: Secondary | ICD-10-CM | POA: Diagnosis not present

## 2015-05-03 DIAGNOSIS — R609 Edema, unspecified: Secondary | ICD-10-CM

## 2015-05-03 DIAGNOSIS — I10 Essential (primary) hypertension: Secondary | ICD-10-CM | POA: Diagnosis not present

## 2015-05-03 DIAGNOSIS — I2510 Atherosclerotic heart disease of native coronary artery without angina pectoris: Secondary | ICD-10-CM | POA: Diagnosis not present

## 2015-05-03 DIAGNOSIS — R600 Localized edema: Secondary | ICD-10-CM

## 2015-05-03 NOTE — Patient Instructions (Signed)
Medication Instructions:  The current medical regimen is effective;  continue present plan and medications.  Follow-Up: Follow up in 6 mnths with Dr. Radford Pax.  You will receive a letter in the mail 2 months before you are due.  Please call us when you receive this letter to schedule your follow up appointment.  If you need a refill on your cardiac medications before your next appointment, please call your pharmacy.  Thank you for choosing [facility]!!

## 2015-05-04 ENCOUNTER — Encounter: Payer: Self-pay | Admitting: Cardiology

## 2015-05-04 ENCOUNTER — Telehealth: Payer: Self-pay

## 2015-05-04 DIAGNOSIS — E785 Hyperlipidemia, unspecified: Secondary | ICD-10-CM

## 2015-05-04 NOTE — Telephone Encounter (Signed)
-----   Message from Aris Georgia, [facility] sent at 05/04/2015  4:51 PM EST ----- Risk factors: CABG, Diabetes, HTN, age - LDL goal < 70 (would at least like to get to < 100 mg/dL if possible) Meds:Crestor 5mg  once weekly Intolerant: Daily lipitor, lovastatin, pravastatin, fluvastatin, simvastatin, Crestor, and Zetia all caused muscle aches. Welchol caused extreme fatigue.  LDL improved >20 points since April.  She states any higher dose of Crestor causes myalgias.  May not qualify for PCSK-9 with LDL < 100mg /dL.    Plan:  1.  Continue current medications. 2.  Recheck labs in 6 months.

## 2015-05-04 NOTE — Telephone Encounter (Signed)
Informed patient of results and verbal understanding expressed.  Repeat labs ordered to be scheduled in 6 months. Recall placed. Patient agrees with treatment plan.

## 2015-05-20 ENCOUNTER — Other Ambulatory Visit: Payer: Self-pay | Admitting: Cardiology

## 2015-05-20 DIAGNOSIS — I6523 Occlusion and stenosis of bilateral carotid arteries: Secondary | ICD-10-CM

## 2015-05-26 DIAGNOSIS — H04123 Dry eye syndrome of bilateral lacrimal glands: Secondary | ICD-10-CM | POA: Diagnosis not present

## 2015-05-26 DIAGNOSIS — H40013 Open angle with borderline findings, low risk, bilateral: Secondary | ICD-10-CM | POA: Diagnosis not present

## 2015-05-26 DIAGNOSIS — L718 Other rosacea: Secondary | ICD-10-CM | POA: Diagnosis not present

## 2015-05-26 DIAGNOSIS — H01003 Unspecified blepharitis right eye, unspecified eyelid: Secondary | ICD-10-CM | POA: Diagnosis not present

## 2015-05-26 DIAGNOSIS — H1859 Other hereditary corneal dystrophies: Secondary | ICD-10-CM | POA: Diagnosis not present

## 2015-06-03 ENCOUNTER — Ambulatory Visit (HOSPITAL_COMMUNITY)
Admission: RE | Admit: 2015-06-03 | Discharge: 2015-06-03 | Disposition: A | Discharge status: Home / Self Care | Payer: Medicare Other | Source: Ambulatory Visit | Attending: Cardiology | Admitting: Cardiology

## 2015-06-03 DIAGNOSIS — E785 Hyperlipidemia, unspecified: Secondary | ICD-10-CM | POA: Insufficient documentation

## 2015-06-03 DIAGNOSIS — E878 Other disorders of electrolyte and fluid balance, not elsewhere classified: Secondary | ICD-10-CM | POA: Insufficient documentation

## 2015-06-03 DIAGNOSIS — I6523 Occlusion and stenosis of bilateral carotid arteries: Secondary | ICD-10-CM | POA: Insufficient documentation

## 2015-06-03 DIAGNOSIS — E119 Type 2 diabetes mellitus without complications: Secondary | ICD-10-CM | POA: Diagnosis not present

## 2015-06-03 DIAGNOSIS — I10 Essential (primary) hypertension: Secondary | ICD-10-CM | POA: Diagnosis not present

## 2015-06-06 ENCOUNTER — Telehealth: Payer: Self-pay

## 2015-06-06 DIAGNOSIS — E785 Hyperlipidemia, unspecified: Secondary | ICD-10-CM

## 2015-06-06 NOTE — Telephone Encounter (Signed)
Informed patient of results and verbal understanding expressed.  Repeat carotids ordered to be scheduled in one year. Patient agrees with treatment plan. 

## 2015-06-06 NOTE — Telephone Encounter (Signed)
-----   Message from Sueanne Margarita, MD sent at 06/06/2015 12:57 PM EST ----- Heterogeneous plaque, bilaterally. > 50% LECA stenosis.  1-39% RICA stenosis.  123456 LICA stenosis.  Repeat study in 1 year

## 2015-06-16 DIAGNOSIS — L603 Nail dystrophy: Secondary | ICD-10-CM | POA: Diagnosis not present

## 2015-06-16 DIAGNOSIS — E1151 Type 2 diabetes mellitus with diabetic peripheral angiopathy without gangrene: Secondary | ICD-10-CM | POA: Diagnosis not present

## 2015-06-16 DIAGNOSIS — I739 Peripheral vascular disease, unspecified: Secondary | ICD-10-CM | POA: Diagnosis not present

## 2015-06-16 DIAGNOSIS — L84 Corns and callosities: Secondary | ICD-10-CM | POA: Diagnosis not present

## 2015-08-04 ENCOUNTER — Other Ambulatory Visit: Payer: Self-pay

## 2015-08-04 DIAGNOSIS — Z1231 Encounter for screening mammogram for malignant neoplasm of breast: Secondary | ICD-10-CM

## 2015-08-17 DIAGNOSIS — E039 Hypothyroidism, unspecified: Secondary | ICD-10-CM | POA: Diagnosis not present

## 2015-08-17 DIAGNOSIS — Z0000 Encounter for general adult medical examination without abnormal findings: Secondary | ICD-10-CM | POA: Diagnosis not present

## 2015-08-17 DIAGNOSIS — R109 Unspecified abdominal pain: Secondary | ICD-10-CM | POA: Diagnosis not present

## 2015-08-17 DIAGNOSIS — E118 Type 2 diabetes mellitus with unspecified complications: Secondary | ICD-10-CM | POA: Diagnosis not present

## 2015-09-14 ENCOUNTER — Ambulatory Visit
Admission: RE | Admit: 2015-09-14 | Discharge: 2015-09-14 | Disposition: A | Discharge status: Home / Self Care | Payer: Medicare Other | Source: Ambulatory Visit

## 2015-09-14 DIAGNOSIS — Z1231 Encounter for screening mammogram for malignant neoplasm of breast: Secondary | ICD-10-CM | POA: Diagnosis not present

## 2015-09-15 DIAGNOSIS — E118 Type 2 diabetes mellitus with unspecified complications: Secondary | ICD-10-CM | POA: Diagnosis not present

## 2015-09-15 DIAGNOSIS — L84 Corns and callosities: Secondary | ICD-10-CM | POA: Diagnosis not present

## 2015-09-15 DIAGNOSIS — E039 Hypothyroidism, unspecified: Secondary | ICD-10-CM | POA: Diagnosis not present

## 2015-09-15 DIAGNOSIS — E1151 Type 2 diabetes mellitus with diabetic peripheral angiopathy without gangrene: Secondary | ICD-10-CM | POA: Diagnosis not present

## 2015-09-15 DIAGNOSIS — I10 Essential (primary) hypertension: Secondary | ICD-10-CM | POA: Diagnosis not present

## 2015-09-15 DIAGNOSIS — I739 Peripheral vascular disease, unspecified: Secondary | ICD-10-CM | POA: Diagnosis not present

## 2015-09-15 DIAGNOSIS — L603 Nail dystrophy: Secondary | ICD-10-CM | POA: Diagnosis not present

## 2015-10-20 DIAGNOSIS — N182 Chronic kidney disease, stage 2 (mild): Secondary | ICD-10-CM | POA: Diagnosis not present

## 2015-10-20 DIAGNOSIS — I129 Hypertensive chronic kidney disease with stage 1 through stage 4 chronic kidney disease, or unspecified chronic kidney disease: Secondary | ICD-10-CM | POA: Diagnosis not present

## 2015-10-24 ENCOUNTER — Telehealth: Payer: Self-pay | Admitting: Cardiology

## 2015-10-24 ENCOUNTER — Other Ambulatory Visit (INDEPENDENT_AMBULATORY_CARE_PROVIDER_SITE_OTHER): Payer: Medicare Other | Admitting: *Deleted

## 2015-10-24 DIAGNOSIS — E785 Hyperlipidemia, unspecified: Secondary | ICD-10-CM

## 2015-10-24 LAB — LIPID PANEL
Cholesterol: 188 mg/dL (ref 125–200)
HDL: 57 mg/dL (ref >=46)
LDL Cholesterol: 97 mg/dL (ref <130)
TRIGLYCERIDES: 169 mg/dL — AB (ref <150)
Total CHOL/HDL Ratio: 3.3 Ratio (ref <=5.0)
VLDL: 34 mg/dL — AB (ref <30)

## 2015-10-24 LAB — ALT: ALT: 13 U/L (ref 6–29)

## 2015-10-24 NOTE — Addendum Note (Signed)
Addended by: Eulis Foster on: 10/24/2015 09:26 AM   Modules accepted: Orders

## 2015-10-24 NOTE — Telephone Encounter (Signed)
If not then refer to lipid clinic

## 2015-10-25 NOTE — Addendum Note (Signed)
Addended by: Harland German A on: 10/25/2015 02:31 PM   Modules accepted: Orders, Medications

## 2015-10-25 NOTE — Telephone Encounter (Signed)
-----   Message from Sueanne Margarita, MD sent at 10/24/2015 7:54 PM EDT -----    LDL not at goal - please find out if she thinks she can increase her Crestor to daily    Patient st she recently stopped Crestor because her legs were hurting her so badly. Scheduled patient 5/30 to be seen with Gay Filler in the [facility] (same day as Dr. Radford Pax OV per patient request). Patient agrees with treatment plan.  Med list updated.

## 2015-10-28 DIAGNOSIS — M25571 Pain in right ankle and joints of right foot: Secondary | ICD-10-CM | POA: Diagnosis not present

## 2015-10-31 ENCOUNTER — Ambulatory Visit: Payer: Medicare Other | Admitting: Cardiology

## 2015-11-08 DIAGNOSIS — E039 Hypothyroidism, unspecified: Secondary | ICD-10-CM | POA: Diagnosis not present

## 2015-11-15 ENCOUNTER — Ambulatory Visit (INDEPENDENT_AMBULATORY_CARE_PROVIDER_SITE_OTHER): Payer: Medicare Other | Admitting: Cardiology

## 2015-11-15 ENCOUNTER — Ambulatory Visit: Payer: Medicare Other | Admitting: Cardiology

## 2015-11-15 ENCOUNTER — Encounter: Payer: Self-pay | Admitting: Cardiology

## 2015-11-15 ENCOUNTER — Ambulatory Visit (INDEPENDENT_AMBULATORY_CARE_PROVIDER_SITE_OTHER): Payer: Medicare Other | Admitting: Pharmacist

## 2015-11-15 VITALS — BP 182/64 | HR 87 | Ht 63.0 in | Wt 194.4 lb

## 2015-11-15 DIAGNOSIS — R600 Localized edema: Secondary | ICD-10-CM

## 2015-11-15 DIAGNOSIS — E785 Hyperlipidemia, unspecified: Secondary | ICD-10-CM | POA: Diagnosis not present

## 2015-11-15 DIAGNOSIS — I2583 Coronary atherosclerosis due to lipid rich plaque: Principal | ICD-10-CM

## 2015-11-15 DIAGNOSIS — I10 Essential (primary) hypertension: Secondary | ICD-10-CM | POA: Diagnosis not present

## 2015-11-15 DIAGNOSIS — I2510 Atherosclerotic heart disease of native coronary artery without angina pectoris: Secondary | ICD-10-CM | POA: Diagnosis not present

## 2015-11-15 DIAGNOSIS — R609 Edema, unspecified: Secondary | ICD-10-CM | POA: Diagnosis not present

## 2015-11-15 DIAGNOSIS — I6523 Occlusion and stenosis of bilateral carotid arteries: Secondary | ICD-10-CM | POA: Diagnosis not present

## 2015-11-15 MED ORDER — CRESTOR 5 MG PO TABS: ORAL_TABLET | ORAL | Status: DC

## 2015-11-15 NOTE — Progress Notes (Signed)
Cardiology Office Note    Date:  11/15/2015   ID:  TALIESHA ACOSTA, DOB Sep 12, 1928, MRN [id number]  PCP:  Gennette Pac, MD  Cardiologist:  Fransico Him, MD   Chief Complaint  Patient presents with  . Coronary Artery Disease  . Hypertension  . Hyperlipidemia    History of Present Illness:  [name] is a 82 y.o. female with a history of ASCAD, HTN and dyslipidemia who presents today for followup. She denies any chest pain, SOB, DOE, dizziness, palpitations or syncope. She has chronic LE edema which is controlled on the diuretic and compression hose. She walks 3 times weekly in a class for exercise and some at home.    Past Medical History  Diagnosis Date  . Hyperchloremia   . Rosacea   . Diabetes mellitus     diet controlled  . Obesity   . Rosacea   . GERD (gastroesophageal reflux disease)   . Gait disorder   . Celiac sprue   . Coronary artery disease     s/p CABG with 50% stenosis at ostium of the SVG to dirst diagonal, patent LIMA to LAD and patent left circ with nonobstructive ASCAD of the RCA  . Hyperlipidemia     statin intolerant  . Hypertension   . Diverticulitis   . Meningioma ([place])   . Carotid artery occlusion     1-39% right and 40-59% left and mild bilateral subclavian artery stenosis - carotid dopplers 04/2013    Past Surgical History  Procedure Laterality Date  . Abdominal hysterectomy    . Brain tumor excision  1993    meningioma, left frontal  . Joint replacement  2009  . Coronary artery bypass graft  2002     3 vessel  . Rotator cuff repair  2010    Dr. Gladstone Lighter  . Bunionectomy    . Replacement total knee    . Tonsillectomy    . Cardiac catheterization      Current Medications: Outpatient Prescriptions Prior to Visit  Medication Sig Dispense Refill  . amLODipine (NORVASC) 5 MG tablet Take 5 mg by mouth 2 (two) times daily. At noon and at bedtime    . Ascorbic Acid (VITAMIN C PO) Take 1 tablet by mouth daily at 3 pm.    . aspirin  EC 81 MG tablet Take 81 mg by mouth daily at 3 pm.    . BENICAR 40 MG tablet   1  . Biotin 5000 MCG TABS Take 5,000 mcg by mouth once a week.     . Calcium Citrate (CITRACAL PO) Take 1 tablet by mouth daily at 3 pm.     . Cholecalciferol 4000 UNITS CAPS Take 4,000 Units by mouth daily at 3 pm.    . clindamycin (CLEOCIN) 150 MG capsule Take 150 mg by mouth as needed. Before dentist appointment    . cloNIDine (CATAPRES) 0.1 MG tablet Take 0.1 mg by mouth 2 (two) times daily. At lunch and at  bedtime    . CRESTOR 5 MG tablet Take 1 tablet by mouth once a week. 4 tablet 11  . furosemide (LASIX) 40 MG tablet Take 40 mg by mouth 2 (two) times daily. Morning and mid evening    . levothyroxine (SYNTHROID, LEVOTHROID) 25 MCG tablet Take 50 mg by mouth daily.     Marland Kitchen METRONIDAZOLE, TOPICAL, 0.75 % LOTN Apply 1 application topically at bedtime as needed (rosacea).    . Multiple Vitamin (MULITIVITAMIN WITH MINERALS) TABS Take  1 tablet by mouth daily at 3 pm.     . pantoprazole (PROTONIX) 40 MG tablet Take 40 mg by mouth daily before breakfast.    . potassium chloride (KLOR-CON) 8 MEQ tablet Take 8 mEq by mouth.  11  . pyridOXINE (VITAMIN B-6) 100 MG tablet Take 100 mg by mouth once a week. On Thursday    . vitamin B-12 (CYANOCOBALAMIN) 500 MCG tablet Take 500 mcg by mouth once a week. On Thursdays     No facility-administered medications prior to visit.     Allergies:   Accutane; Ciprofloxacin; Erythromycin; Gluten; Isotretinoin; Lipitor; Lisinopril; Lovastatin; Penicillins; Phenytoin sodium extended; Pravastatin; Crestor; Gabapentin; Prolia; Boniva; Clarithromycin; Lescol; Sulfa antibiotics; Welchol; Zetia; and Zithromax   Social History   Social History  . Marital Status: Widowed    Spouse Name: N/A  . Number of Children: 4  . Years of Education: 16   Occupational History  . Retired    Social History Main Topics  . Smoking status: Never Smoker   . Smokeless tobacco: None  . Alcohol Use: No    . Drug Use: No  . Sexual Activity: Not Asked   Other Topics Concern  . None   Social History Narrative     Family History:  The patient's family history includes Diabetes in her mother, sister, and sister; Heart disease in her brother.   ROS:   Please see the history of present illness.    ROS All other systems reviewed and are negative.   PHYSICAL EXAM:   VS:  BP 182/64 mmHg  Pulse 87  Ht 5\' 3"  (1.6 m)  Wt 194 lb 6.4 oz (88.179 kg)  BMI 34.44 kg/m2   GEN: Well nourished, well developed, in no acute distress HEENT: normal Neck: no JVD or masses.  Faint left carotid bruit Cardiac: RRR; no murmurs, rubs, or gallops.  1+edema.  Intact distal pulses bilaterally.  Respiratory:  clear to auscultation bilaterally, normal work of breathing GI: soft, nontender, nondistended, + BS MS: no deformity or atrophy Skin: warm and dry, no rash Neuro:  Alert and Oriented x 3, Strength and sensation are intact Psych: euthymic mood, full affect  Wt Readings from Last 3 Encounters:  11/15/15 194 lb 6.4 oz (88.179 kg)  05/03/15 185 lb 6.4 oz (84.097 kg)  11/02/14 185 lb 1.9 oz (83.97 kg)      Studies/Labs Reviewed:   EKG:  EKG is  ordered today and showed NSR at 87bpm with nonspecific T wave abnormality  Recent Labs: 10/24/2015: ALT 13   Lipid Panel    Component Value Date/Time   CHOL 188 10/24/2015 0926   TRIG 169* 10/24/2015 0926   HDL 57 10/24/2015 0926   CHOLHDL 3.3 10/24/2015 0926   VLDL 34* 10/24/2015 0926   LDLCALC 97 10/24/2015 0926   LDLDIRECT 133.9 03/24/2014 0824    Additional studies/ records that were reviewed today include:  none    ASSESSMENT:    1. Coronary artery disease due to lipid rich plaque   2. Essential hypertension   3. Hyperlipidemia   4. Edema extremities   5. Bilateral carotid artery stenosis      PLAN:  In order of problems listed above:  1. ASCAD s/p CABG with 50% stenosis of the ostium of the SVG to D1, patent LIMA to LAD and patent  LCX with moderate ASCAD of the RCA.  Currently she has no angina. Continue ASA 2. HTN - BP fairly well controlled on current medical regimen.  She brought in her BP readings and most are 140-150/60 - 80's.  Continue amlodipine/ARB/Clonidine. 3. Hyperlipidemia - LDL goal < 70 - she is statin intolerant but we are going to try Crestor 5mg  qweekly.   4. LE edema - controlled on diuretics.  Continue Lasix.  5. Bilateral carotid artery stenosis.  Continue ASA/statin.  Repeat doppler 05/2016    Medication Adjustments/Labs and Tests Ordered: Current medicines are reviewed at length with the patient today.  Concerns regarding medicines are outlined above.  Medication changes, Labs and Tests ordered today are listed in the Patient Instructions below.  Patient Instructions  Medication Instructions:  Your physician recommends that you continue on your current medications as directed. Please refer to the Current Medication list given to you today.   Labwork: None  Testing/Procedures: Your physician has requested that you have a carotid duplex in December, 2017. This test is an ultrasound of the carotid arteries in your neck. It looks at blood flow through these arteries that supply the brain with blood. Allow one hour for this exam. There are no restrictions or special instructions.  Follow-Up: Your physician wants you to follow-up in: 6 months with Dr. Radford Pax. You will receive a reminder letter in the mail two months in advance. If you don't receive a letter, please call our office to schedule the follow-up appointment.   Any Other Special Instructions Will Be Listed Below (If Applicable).     If you need a refill on your cardiac medications before your next appointment, please call your pharmacy.       Signed, Fransico Him, MD  11/15/2015 12:50 PM    [address], [place], [place]  [address] Phone: [phone]; Fax: [phone]

## 2015-11-15 NOTE — Patient Instructions (Signed)
Medication Instructions:  Your physician recommends that you continue on your current medications as directed. Please refer to the Current Medication list given to you today.   Labwork: None  Testing/Procedures: Your physician has requested that you have a carotid duplex in December, 2017. This test is an ultrasound of the carotid arteries in your neck. It looks at blood flow through these arteries that supply the brain with blood. Allow one hour for this exam. There are no restrictions or special instructions.  Follow-Up: Your physician wants you to follow-up in: 6 months with Dr. Radford Pax. You will receive a reminder letter in the mail two months in advance. If you don't receive a letter, please call our office to schedule the follow-up appointment.   Any Other Special Instructions Will Be Listed Below (If Applicable).     If you need a refill on your cardiac medications before your next appointment, please call your pharmacy.

## 2015-11-15 NOTE — Patient Instructions (Signed)
Start taking Crestor 5mg  once weekly - I sent in the brand prescription to your pharmacy.  Call Britteney Ayotte in lipid clinic with any questions [phone].  We will recheck your cholesterol in 3 months. Come in for fasting lab work on Thursday, August 31st. Lab opens at 7:30am, you can come in any time after that.

## 2015-11-15 NOTE — Progress Notes (Signed)
Patient ID: [name]                 DOB: Jul 04, 1928                    MRN: [id number]     HPI: [name] is a 82 y.o. female patient referred to lipid clinic by Dr. Radford Pax. PMH is significant for CVD, CABG and CAD with 50% stenosis, DM, and HLD. She has previously been seen in lipid clinic and has multiple statin intolerances. Most recently, patient stopped her rosuvastatin 5mg  once weekly because of myalgias. She reports that she did not have this issue with brand Crestor. Of note, pt has over 20 medication allergies listed.  Current Medications: none  Intolerances: rosuvastatin 5mg  once weekly, atorvastatin, lovastatin, pravastatin, fluvastatin, Welchol, Zetia - myalgias Risk Factors: CABG, CAD with 50% stenosis LDL goal: < 70mg /dL   Diet: Gluten free, looking for a dietician.  Exercise: Balance problem, knee replacement, has pain at rest and does not get much activity.  Family History: Mother with DM, brother with heart disease, sister with DM.  Social History: Pt does not smoke cigarettes, does not drink alcohol, and does not use illicit drugs.  Labs: 10/24/15: TC 188, TG 169, HDL 57, LDL 97, ALT normal (on no lipid-lowering therapy)  Past Medical History  Diagnosis Date  . Hyperchloremia   . Rosacea   . Diabetes mellitus     diet controlled  . Obesity   . Rosacea   . GERD (gastroesophageal reflux disease)   . Gait disorder   . Celiac sprue   . Coronary artery disease     s/p CABG with 50% stenosis at ostium of the SVG to dirst diagonal, patent LIMA to LAD and patent left circ with nonobstructive ASCAD of the RCA  . Hyperlipidemia     statin intolerant  . Hypertension   . Diverticulitis   . Meningioma ([place])   . Carotid artery occlusion     1-39% right and 40-59% left and mild bilateral subclavian artery stenosis - carotid dopplers 04/2013    Current Outpatient Prescriptions on File Prior to Visit  Medication Sig Dispense Refill  . amLODipine (NORVASC) 5 MG  tablet Take 5 mg by mouth 2 (two) times daily. At noon and at bedtime    . Ascorbic Acid (VITAMIN C PO) Take 1 tablet by mouth daily at 3 pm.    . aspirin EC 81 MG tablet Take 81 mg by mouth daily at 3 pm.    . BENICAR 40 MG tablet   1  . Biotin 5000 MCG TABS Take 5,000 mcg by mouth once a week.     . Calcium Citrate (CITRACAL PO) Take 1 tablet by mouth daily at 3 pm.     . Cholecalciferol 4000 UNITS CAPS Take 4,000 Units by mouth daily at 3 pm.    . clindamycin (CLEOCIN) 150 MG capsule Take 150 mg by mouth as needed. Before dentist appointment    . cloNIDine (CATAPRES) 0.1 MG tablet Take 0.1 mg by mouth 2 (two) times daily. At lunch and at  bedtime    . furosemide (LASIX) 40 MG tablet Take 40 mg by mouth 2 (two) times daily. Morning and mid evening    . levothyroxine (SYNTHROID, LEVOTHROID) 25 MCG tablet Take 25 mg by mouth daily.    Marland Kitchen METRONIDAZOLE, TOPICAL, 0.75 % LOTN Apply 1 application topically at bedtime as needed (rosacea).    . Multiple Vitamin (MULITIVITAMIN  WITH MINERALS) TABS Take 1 tablet by mouth daily at 3 pm.     . pantoprazole (PROTONIX) 40 MG tablet Take 40 mg by mouth daily before breakfast.    . potassium chloride (KLOR-CON) 8 MEQ tablet Take 8 mEq by mouth.  11  . pyridOXINE (VITAMIN B-6) 100 MG tablet Take 100 mg by mouth once a week. On Thursday    . vitamin B-12 (CYANOCOBALAMIN) 500 MCG tablet Take 500 mcg by mouth once a week. On Thursdays     No current facility-administered medications on file prior to visit.    Allergies  Allergen Reactions  . Accutane [Isotretinoin] Other (See Comments)    Affected liver  . Ciprofloxacin Rash and Other (See Comments)    Kidney failure  . Erythromycin Diarrhea and Rash  . Gluten Diarrhea and Other (See Comments)    Celiac sprue  . Isotretinoin Other (See Comments)    Affects liver  . Lipitor [Atorvastatin] Other (See Comments)    Affects legs  . Lisinopril Other (See Comments)    Pt doesn't remember reaction   .  Lovastatin Other (See Comments)    Affects legs  . Penicillins Other (See Comments)    From childhood  . Phenytoin Sodium Extended Other (See Comments)    Affects liver  . Pravastatin Other (See Comments)    Affected legs  . Crestor [Rosuvastatin]     Muscle aches (tolerates 5 mg weekly)  . Gabapentin Other (See Comments)    Shaky, swollen legs  . Prolia [Denosumab] Other (See Comments)    Joints and muscle aches, loss of hair  . Boniva [Ibandronic Acid] Other (See Comments)    headaches  . Clarithromycin Rash  . Lescol [Fluvastatin Sodium] Other (See Comments)    Affects legs  . Sulfa Antibiotics Rash  . Welchol [Colesevelam Hcl] Other (See Comments)    No energy  . Zetia [Ezetimibe] Other (See Comments)    myalgias  . Zithromax [Azithromycin] Rash    Assessment/Plan:  1. Hyperlipidemia - LDL 97 above goal < 70mg /dL given history of CABG and CAD with 50% stenosis. Intolerances to 5 statins, Welchol, and Zetia. PCSK9i too expensive given patient's Medicare insurance. Will not qualify for lipid trials given age. Not many options to treat LDL. Pt does report that she tolerated Crestor 5mg  better than generic rosuvastatin. Resent rx for brand Crestor 5mg  once weekly and will recheck lipids in 3 months. Pt in agreement with plan.   [name] E. Hilary Milks, PharmD, [place] [id number], [place], [place] [address] Phone: [phone]; Fax: [phone] 11/15/2015 11:19 AM

## 2015-11-21 DIAGNOSIS — Z96651 Presence of right artificial knee joint: Secondary | ICD-10-CM | POA: Diagnosis not present

## 2015-11-21 DIAGNOSIS — Z471 Aftercare following joint replacement surgery: Secondary | ICD-10-CM | POA: Diagnosis not present

## 2015-11-21 DIAGNOSIS — M545 Low back pain: Secondary | ICD-10-CM | POA: Diagnosis not present

## 2015-11-30 DIAGNOSIS — H04123 Dry eye syndrome of bilateral lacrimal glands: Secondary | ICD-10-CM | POA: Diagnosis not present

## 2015-11-30 DIAGNOSIS — H01003 Unspecified blepharitis right eye, unspecified eyelid: Secondary | ICD-10-CM | POA: Diagnosis not present

## 2015-11-30 DIAGNOSIS — H1859 Other hereditary corneal dystrophies: Secondary | ICD-10-CM | POA: Diagnosis not present

## 2015-11-30 DIAGNOSIS — H40013 Open angle with borderline findings, low risk, bilateral: Secondary | ICD-10-CM | POA: Diagnosis not present

## 2015-12-09 DIAGNOSIS — R21 Rash and other nonspecific skin eruption: Secondary | ICD-10-CM | POA: Diagnosis not present

## 2015-12-15 DIAGNOSIS — I739 Peripheral vascular disease, unspecified: Secondary | ICD-10-CM | POA: Diagnosis not present

## 2015-12-15 DIAGNOSIS — L603 Nail dystrophy: Secondary | ICD-10-CM | POA: Diagnosis not present

## 2016-01-04 DIAGNOSIS — H1859 Other hereditary corneal dystrophies: Secondary | ICD-10-CM | POA: Diagnosis not present

## 2016-01-04 DIAGNOSIS — Z961 Presence of intraocular lens: Secondary | ICD-10-CM | POA: Diagnosis not present

## 2016-01-06 ENCOUNTER — Telehealth: Payer: Self-pay | Admitting: Pharmacist

## 2016-01-06 NOTE — Telephone Encounter (Signed)
Pt called to report she has myalgias with Crestor 5mg  once weekly. Of note, pt has over 20 medication allergies listed, including intolerances to 5 statins, Welchol, and Zetia.  PCSK9i will be too expensive d/t Medicare status and pt is too old to qualify for clinical trials. Do not have any other options to lower her cholesterol (LDL most recently 97 above goal < 70). Will update medication list and route to Dr. Radford Pax as an Jul[facility] Rainier.

## 2016-02-13 ENCOUNTER — Telehealth: Payer: Self-pay | Admitting: Cardiology

## 2016-02-13 NOTE — Telephone Encounter (Signed)
Patient has a question about her upcoming lab work

## 2016-02-13 NOTE — Telephone Encounter (Signed)
Pt called to ask if she needed to have her blood work done this week.  Reviewed chart.  Last lipid panel was done in May while pt was not on any therapy.  These were scheduled when she started Crestor but she called in July and stated she was unable to tolerate the Crestor any longer.  She is not taking any medication now for her cholesterol.  Will cancel labs and no need to check at this time.

## 2016-02-16 ENCOUNTER — Other Ambulatory Visit: Payer: Medicare Other

## 2016-03-05 DIAGNOSIS — I10 Essential (primary) hypertension: Secondary | ICD-10-CM | POA: Diagnosis not present

## 2016-03-05 DIAGNOSIS — Z961 Presence of intraocular lens: Secondary | ICD-10-CM | POA: Diagnosis not present

## 2016-03-05 DIAGNOSIS — E119 Type 2 diabetes mellitus without complications: Secondary | ICD-10-CM | POA: Diagnosis not present

## 2016-03-05 DIAGNOSIS — H1859 Other hereditary corneal dystrophies: Secondary | ICD-10-CM | POA: Diagnosis not present

## 2016-03-05 DIAGNOSIS — Z7982 Long term (current) use of aspirin: Secondary | ICD-10-CM | POA: Diagnosis not present

## 2016-03-19 DIAGNOSIS — L84 Corns and callosities: Secondary | ICD-10-CM | POA: Diagnosis not present

## 2016-03-19 DIAGNOSIS — L603 Nail dystrophy: Secondary | ICD-10-CM | POA: Diagnosis not present

## 2016-03-19 DIAGNOSIS — E1151 Type 2 diabetes mellitus with diabetic peripheral angiopathy without gangrene: Secondary | ICD-10-CM | POA: Diagnosis not present

## 2016-03-19 DIAGNOSIS — I739 Peripheral vascular disease, unspecified: Secondary | ICD-10-CM | POA: Diagnosis not present

## 2016-04-06 DIAGNOSIS — Z23 Encounter for immunization: Secondary | ICD-10-CM | POA: Diagnosis not present

## 2016-04-25 DIAGNOSIS — E039 Hypothyroidism, unspecified: Secondary | ICD-10-CM | POA: Diagnosis not present

## 2016-04-25 DIAGNOSIS — I779 Disorder of arteries and arterioles, unspecified: Secondary | ICD-10-CM | POA: Diagnosis not present

## 2016-04-25 DIAGNOSIS — E559 Vitamin D deficiency, unspecified: Secondary | ICD-10-CM | POA: Diagnosis not present

## 2016-04-25 DIAGNOSIS — S32000A Wedge compression fracture of unspecified lumbar vertebra, initial encounter for closed fracture: Secondary | ICD-10-CM | POA: Diagnosis not present

## 2016-04-25 DIAGNOSIS — E7800 Pure hypercholesterolemia, unspecified: Secondary | ICD-10-CM | POA: Diagnosis not present

## 2016-04-25 DIAGNOSIS — E1122 Type 2 diabetes mellitus with diabetic chronic kidney disease: Secondary | ICD-10-CM | POA: Diagnosis not present

## 2016-04-25 DIAGNOSIS — M810 Age-related osteoporosis without current pathological fracture: Secondary | ICD-10-CM | POA: Diagnosis not present

## 2016-04-25 DIAGNOSIS — I10 Essential (primary) hypertension: Secondary | ICD-10-CM | POA: Diagnosis not present

## 2016-04-25 DIAGNOSIS — E118 Type 2 diabetes mellitus with unspecified complications: Secondary | ICD-10-CM | POA: Diagnosis not present

## 2016-04-25 DIAGNOSIS — I25119 Atherosclerotic heart disease of native coronary artery with unspecified angina pectoris: Secondary | ICD-10-CM | POA: Diagnosis not present

## 2016-04-25 DIAGNOSIS — Z0000 Encounter for general adult medical examination without abnormal findings: Secondary | ICD-10-CM | POA: Diagnosis not present

## 2016-05-14 ENCOUNTER — Ambulatory Visit: Payer: Medicare Other | Admitting: Cardiology

## 2016-05-17 DIAGNOSIS — Z85828 Personal history of other malignant neoplasm of skin: Secondary | ICD-10-CM | POA: Diagnosis not present

## 2016-05-17 DIAGNOSIS — L718 Other rosacea: Secondary | ICD-10-CM | POA: Diagnosis not present

## 2016-05-17 DIAGNOSIS — C44311 Basal cell carcinoma of skin of nose: Secondary | ICD-10-CM | POA: Diagnosis not present

## 2016-05-17 DIAGNOSIS — D485 Neoplasm of uncertain behavior of skin: Secondary | ICD-10-CM | POA: Diagnosis not present

## 2016-05-17 DIAGNOSIS — L821 Other seborrheic keratosis: Secondary | ICD-10-CM | POA: Diagnosis not present

## 2016-05-17 DIAGNOSIS — L853 Xerosis cutis: Secondary | ICD-10-CM | POA: Diagnosis not present

## 2016-05-17 DIAGNOSIS — D1801 Hemangioma of skin and subcutaneous tissue: Secondary | ICD-10-CM | POA: Diagnosis not present

## 2016-06-05 ENCOUNTER — Ambulatory Visit (HOSPITAL_COMMUNITY)
Admission: RE | Admit: 2016-06-05 | Discharge: 2016-06-05 | Disposition: A | Discharge status: Home / Self Care | Payer: Medicare Other | Source: Ambulatory Visit | Attending: Cardiology | Admitting: Cardiology

## 2016-06-05 DIAGNOSIS — E785 Hyperlipidemia, unspecified: Secondary | ICD-10-CM | POA: Diagnosis not present

## 2016-06-05 DIAGNOSIS — I779 Disorder of arteries and arterioles, unspecified: Secondary | ICD-10-CM | POA: Diagnosis not present

## 2016-06-05 DIAGNOSIS — I6523 Occlusion and stenosis of bilateral carotid arteries: Secondary | ICD-10-CM | POA: Insufficient documentation

## 2016-06-10 ENCOUNTER — Encounter: Payer: Self-pay | Admitting: Cardiology

## 2016-06-12 DIAGNOSIS — Z85828 Personal history of other malignant neoplasm of skin: Secondary | ICD-10-CM | POA: Diagnosis not present

## 2016-06-12 DIAGNOSIS — C44311 Basal cell carcinoma of skin of nose: Secondary | ICD-10-CM | POA: Diagnosis not present

## 2016-06-13 ENCOUNTER — Telehealth: Payer: Self-pay

## 2016-06-13 DIAGNOSIS — I6523 Occlusion and stenosis of bilateral carotid arteries: Secondary | ICD-10-CM

## 2016-06-13 NOTE — Telephone Encounter (Signed)
-----   Message from Sueanne Margarita, MD sent at 06/10/2016 10:06 AM EST ----- 1-39% bilateral carotid stenosis - repeat dopplers in 1 year

## 2016-06-13 NOTE — Telephone Encounter (Signed)
Per DPR form, left detailed message informing patient of doppler results and repeat study to be done in 1 year. Instructed her to call back with any questions or concerns.   Repeat carotids ordered to be scheduled in 1 year.

## 2016-06-21 DIAGNOSIS — E1151 Type 2 diabetes mellitus with diabetic peripheral angiopathy without gangrene: Secondary | ICD-10-CM | POA: Diagnosis not present

## 2016-06-21 DIAGNOSIS — L603 Nail dystrophy: Secondary | ICD-10-CM | POA: Diagnosis not present

## 2016-06-21 DIAGNOSIS — L84 Corns and callosities: Secondary | ICD-10-CM | POA: Diagnosis not present

## 2016-06-21 DIAGNOSIS — I739 Peripheral vascular disease, unspecified: Secondary | ICD-10-CM | POA: Diagnosis not present

## 2016-07-09 ENCOUNTER — Ambulatory Visit (INDEPENDENT_AMBULATORY_CARE_PROVIDER_SITE_OTHER): Payer: Medicare Other | Admitting: Cardiology

## 2016-07-09 ENCOUNTER — Encounter: Payer: Self-pay | Admitting: Cardiology

## 2016-07-09 VITALS — BP 160/62 | HR 98 | Ht 63.0 in | Wt 189.0 lb

## 2016-07-09 DIAGNOSIS — I10 Essential (primary) hypertension: Secondary | ICD-10-CM | POA: Diagnosis not present

## 2016-07-09 DIAGNOSIS — E7800 Pure hypercholesterolemia, unspecified: Secondary | ICD-10-CM | POA: Diagnosis not present

## 2016-07-09 DIAGNOSIS — I2510 Atherosclerotic heart disease of native coronary artery without angina pectoris: Secondary | ICD-10-CM

## 2016-07-09 DIAGNOSIS — I6523 Occlusion and stenosis of bilateral carotid arteries: Secondary | ICD-10-CM | POA: Diagnosis not present

## 2016-07-09 MED ORDER — AMLODIPINE BESYLATE 10 MG PO TABS: 10.0000 mg | ORAL_TABLET | Freq: Every day | ORAL | 11 refills | Status: DC

## 2016-07-09 MED ORDER — CLONIDINE HCL 0.2 MG PO TABS: 0.2000 mg | ORAL_TABLET | Freq: Two times a day (BID) | ORAL | 11 refills | Status: DC

## 2016-07-09 NOTE — Patient Instructions (Signed)
Medication Instructions:  1) INCREASE CLONIDINE to 0.2 mg TWICE DAILY 2) CHANGE AMLODIPINE to 10 mg daily  Labwork: None  Testing/Procedures: None  Follow-Up: Your physician recommends that you schedule a follow-up appointment with the HTN/LIPID CLINIC.  Your physician wants you to follow-up in: 6 months with Dr. Radford Pax. You will receive a reminder letter in the mail two months in advance. If you don't receive a letter, please call our office to schedule the follow-up appointment.   Any Other Special Instructions Will Be Listed Below (If Applicable).     If you need a refill on your cardiac medications before your next appointment, please call your pharmacy.

## 2016-07-09 NOTE — Progress Notes (Signed)
Cardiology Office Note    Date:  07/09/2016   ID:  [name], DOB [birth year], MRN [id number]  PCP:  Gennette Pac, MD  Cardiologist:  Fransico Him, MD   No chief complaint on file.   History of Present Illness:  [name] is a 83 y.o. female with a history of ASCAD, HTN and dyslipidemia who presents today for followup. She denies any chest pain, SOB, DOE, dizziness, palpitations, claudication or syncope. She has chronic LE edema which is controlled on the diuretic and compression hose. She walks 3 times weekly in a class for exercise and some at home.    Past Medical History:  Diagnosis Date  . Carotid artery occlusion    1-39% bilateral carotid stenosis by dopplers 05/2016  . Celiac sprue   . Coronary artery disease    s/p CABG with 50% stenosis at ostium of the SVG to dirst diagonal, patent LIMA to LAD and patent left circ with nonobstructive ASCAD of the RCA  . Diabetes mellitus    diet controlled  . Diverticulitis   . Gait disorder   . GERD (gastroesophageal reflux disease)   . Hyperchloremia   . Hyperlipidemia    statin intolerant  . Hypertension   . Meningioma ([place])   . Obesity   . Rosacea   . Rosacea     Past Surgical History:  Procedure Laterality Date  . ABDOMINAL HYSTERECTOMY    . BRAIN TUMOR EXCISION  1993   meningioma, left frontal  . BUNIONECTOMY    . CARDIAC CATHETERIZATION    . CORONARY ARTERY BYPASS GRAFT  2002    3 vessel  . JOINT REPLACEMENT  2009  . REPLACEMENT TOTAL KNEE    . ROTATOR CUFF REPAIR  2010   Dr. Gladstone Lighter  . TONSILLECTOMY      Current Medications: Outpatient Medications Prior to Visit  Medication Sig Dispense Refill  . amLODipine (NORVASC) 5 MG tablet Take 5 mg by mouth 2 (two) times daily. At noon and at bedtime    . Ascorbic Acid (VITAMIN C PO) Take 1 tablet by mouth daily at 3 pm.    . aspirin EC 81 MG tablet Take 81 mg by mouth daily at 3 pm.    . Biotin 5000 MCG TABS Take 5,000 mcg by mouth once a week.       . Calcium Citrate (CITRACAL PO) Take 1 tablet by mouth daily at 3 pm.     . Cholecalciferol 4000 UNITS CAPS Take 4,000 Units by mouth daily at 3 pm.    . clindamycin (CLEOCIN) 150 MG capsule Take 150 mg by mouth as needed. Before dentist appointment    . cloNIDine (CATAPRES) 0.1 MG tablet Take 0.1 mg by mouth 2 (two) times daily. At lunch and at  bedtime    . furosemide (LASIX) 40 MG tablet Take 40 mg by mouth 2 (two) times daily. Morning and mid evening    . levothyroxine (SYNTHROID, LEVOTHROID) 25 MCG tablet Take 50 mg by mouth daily.     Marland Kitchen METRONIDAZOLE, TOPICAL, 0.75 % LOTN Apply 1 application topically at bedtime as needed (rosacea).    . Multiple Vitamin (MULITIVITAMIN WITH MINERALS) TABS Take 1 tablet by mouth daily at 3 pm.     . pantoprazole (PROTONIX) 40 MG tablet Take 40 mg by mouth daily before breakfast.    . potassium chloride (KLOR-CON) 8 MEQ tablet Take 8 mEq by mouth.  11  . pyridOXINE (VITAMIN B-6) 100 MG tablet Take  100 mg by mouth once a week. On Thursday    . vitamin B-12 (CYANOCOBALAMIN) 500 MCG tablet Take 500 mcg by mouth once a week. On Thursdays    . BENICAR 40 MG tablet   1   No facility-administered medications prior to visit.      Allergies:   Accutane [isotretinoin]; Ciprofloxacin; Erythromycin; Gluten; Isotretinoin; Lipitor [atorvastatin]; Lisinopril; Lovastatin; Penicillins; Phenytoin sodium extended; Pravastatin; Crestor [rosuvastatin]; Gabapentin; Prolia [denosumab]; Boniva [ibandronic acid]; Clarithromycin; Lescol [fluvastatin sodium]; Sulfa antibiotics; Welchol [colesevelam hcl]; Zetia [ezetimibe]; and Zithromax [azithromycin]   Social History   Social History  . Marital status: Widowed    Spouse name: N/A  . Number of children: 4  . Years of education: 24   Occupational History  . Retired Retired   Social History Main Topics  . Smoking status: Never Smoker  . Smokeless tobacco: Never Used  . Alcohol use No  . Drug use: No  . Sexual activity:  Not Asked   Other Topics Concern  . None   Social History Narrative  . None     Family History:  The patient's family history includes Diabetes in her mother, sister, and sister; Heart disease in her brother.   ROS:   Please see the history of present illness.    ROS All other systems reviewed and are negative.  No flowsheet data found.     PHYSICAL EXAM:   VS:  BP (!) 160/62   Pulse 98   Ht 5\' 3"  (1.6 m)   Wt 189 lb (85.7 kg)   SpO2 97%   BMI 33.48 kg/m    GEN: Well nourished, well developed, in no acute distress  HEENT: normal  Neck: no JVD, carotid bruits, or masses Cardiac: RRR; no murmurs, rubs, or gallops,no edema.  Intact distal pulses bilaterally.  Respiratory:  clear to auscultation bilaterally, normal work of breathing GI: soft, nontender, nondistended, + BS MS: no deformity or atrophy  Skin: warm and dry, no rash Neuro:  Alert and Oriented x 3, Strength and sensation are intact Psych: euthymic mood, full affect  Wt Readings from Last 3 Encounters:  07/09/16 189 lb (85.7 kg)  11/15/15 194 lb 6.4 oz (88.2 kg)  05/03/15 185 lb 6.4 oz (84.1 kg)      Studies/Labs Reviewed:   EKG:  EKG is not ordered today.    Recent Labs: 10/24/2015: ALT 13   Lipid Panel    Component Value Date/Time   CHOL 188 10/24/2015 0926   TRIG 169 (H) 10/24/2015 0926   HDL 57 10/24/2015 0926   CHOLHDL 3.3 10/24/2015 0926   VLDL 34 (H) 10/24/2015 0926   LDLCALC 97 10/24/2015 0926   LDLDIRECT 133.9 03/24/2014 0824    Additional studies/ records that were reviewed today include:  none    ASSESSMENT:    1. Coronary artery disease involving native coronary artery of native heart without angina pectoris   2. Essential hypertension   3. Bilateral carotid artery stenosis   4. Pure hypercholesterolemia      PLAN:  In order of problems listed above:  1. ASCAD - s/p CABG with 50% stenosis at ostium of the SVG to dirst diagonal, patent LIMA to LAD and patent left circ  with nonobstructive ASCAD of the RCA - she has no anginal symptoms.  Continue ASA. He is statin intolerant.   2. HTN - BP not well controlled on current meds.  She brought her BP readings in today and her SBP is still running in  the 150-170's.  Continue amlodipine/clonindine/ARB. I will change her amlodipine to 10mg  daily instead of 5mg  BID.  I am going to increase her clonidine to 0.2mg  BID and have her followup in HTN clinic in 1 week.   3. Bilateral carotid artery stenosis 1-39% - continue ASA.   4. Hyperlipidemia - LDL goal < 70.  She is stating intolerant.  I will refer him to lipid clinic for possible PCSK 9 drug. 5. LE edema controlled on diuretics - continue Lasix.     Medication Adjustments/Labs and Tests Ordered: Current medicines are reviewed at length with the patient today.  Concerns regarding medicines are outlined above.  Medication changes, Labs and Tests ordered today are listed in the Patient Instructions below.  There are no Patient Instructions on file for this visit.   Signed, Fransico Him, MD  07/09/2016 11:34 AM    [address], [place], [place]  [address] Phone: [phone]; Fax: [phone]

## 2016-07-17 ENCOUNTER — Ambulatory Visit: Payer: Medicare Other

## 2016-07-17 NOTE — Progress Notes (Deleted)
Patient ID: [name]                 DOB: 11-27-1928                    MRN: [id number]     HPI: [name] is a 83 y.o. female patient referred to lipid clinic by Dr. Radford Pax. PMH is significant for CVD, CABG and CAD with 50% stenosis, DM, and HLD. She has previously been seen in lipid clinic and has multiple statin intolerances. Most recently, patient stopped her rosuvastatin 5mg  once weekly because of myalgias. She reports that she did not have this issue with brand Crestor. Of note, pt has over 20 medication allergies listed, including intolerances to 5 statins, Welchol, and Zetia.  Clinical trial? chlorthaldione or spiro but need baseline labs. Or hydralazine  Current Lipid Medications: none  Intolerances: rosuvastatin 5mg  and 10mg  once weekly, atorvastatin, lovastatin, pravastatin, fluvastatin, Welchol, Zetia - myalgias Risk Factors: CABG, CAD with 50% stenosis, DM LDL goal: < 70mg /dL   Current BP Medications: irbesartan 300mg  daily, clonidine 0.2mg  BID, amlodipine 10mg  daily Intolerances: lisinopril - doesn't remember reaction BP goal: <150/33mmHg  Diet: Gluten free, looking for a dietician.  Exercise: Balance problem, knee replacement, has pain at rest and does not get much activity.  Family History: Mother with DM, brother with heart disease, sister with DM.  Social History: Pt does not smoke cigarettes, does not drink alcohol, and does not use illicit drugs.  Labs: 10/24/15: TC 188, TG 169, HDL 57, LDL 97, ALT normal (on no lipid-lowering therapy)  Past Medical History:  Diagnosis Date  . Carotid artery occlusion    1-39% bilateral carotid stenosis by dopplers 05/2016  . Celiac sprue   . Coronary artery disease    s/p CABG with 50% stenosis at ostium of the SVG to dirst diagonal, patent LIMA to LAD and patent left circ with nonobstructive ASCAD of the RCA  . Diabetes mellitus    diet controlled  . Diverticulitis   . Gait disorder   . GERD (gastroesophageal  reflux disease)   . Hyperchloremia   . Hyperlipidemia    statin intolerant  . Hypertension   . Meningioma ([place])   . Obesity   . Rosacea     Current Outpatient Prescriptions on File Prior to Visit  Medication Sig Dispense Refill  . amLODipine (NORVASC) 10 MG tablet Take 1 tablet (10 mg total) by mouth daily. 30 tablet 11  . Ascorbic Acid (VITAMIN C PO) Take 1 tablet by mouth daily at 3 pm.    . aspirin EC 81 MG tablet Take 81 mg by mouth daily at 3 pm.    . Biotin 5000 MCG TABS Take 5,000 mcg by mouth once a week.     . Calcium Citrate (CITRACAL PO) Take 1 tablet by mouth daily at 3 pm.     . Cholecalciferol 4000 UNITS CAPS Take 4,000 Units by mouth daily at 3 pm.    . clindamycin (CLEOCIN) 150 MG capsule Take 150 mg by mouth as needed. Before dentist appointment    . cloNIDine (CATAPRES) 0.2 MG tablet Take 1 tablet (0.2 mg total) by mouth 2 (two) times daily. 60 tablet 11  . furosemide (LASIX) 40 MG tablet Take 40 mg by mouth 2 (two) times daily. Morning and mid evening    . irbesartan (AVAPRO) 300 MG tablet Take 300 mg by mouth daily.    Marland Kitchen levothyroxine (SYNTHROID, LEVOTHROID) 25 MCG tablet Take  50 mg by mouth daily.     Marland Kitchen METRONIDAZOLE, TOPICAL, 0.75 % LOTN Apply 1 application topically at bedtime as needed (rosacea).    . Multiple Vitamin (MULITIVITAMIN WITH MINERALS) TABS Take 1 tablet by mouth daily at 3 pm.     . pantoprazole (PROTONIX) 40 MG tablet Take 40 mg by mouth daily before breakfast.    . potassium chloride (KLOR-CON) 8 MEQ tablet Take 8 mEq by mouth.  11  . pyridOXINE (VITAMIN B-6) 100 MG tablet Take 100 mg by mouth once a week. On Thursday    . vitamin B-12 (CYANOCOBALAMIN) 500 MCG tablet Take 500 mcg by mouth once a week. On Thursdays     No current facility-administered medications on file prior to visit.     Allergies  Allergen Reactions  . Accutane [Isotretinoin] Other (See Comments)    Affected liver  . Ciprofloxacin Rash and Other (See Comments)    Kidney  failure  . Erythromycin Diarrhea and Rash  . Gluten Diarrhea and Other (See Comments)    Celiac sprue  . Isotretinoin Other (See Comments)    Affects liver  . Lipitor [Atorvastatin] Other (See Comments)    Affects legs  . Lisinopril Other (See Comments)    Pt doesn't remember reaction   . Lovastatin Other (See Comments)    Affects legs  . Penicillins Other (See Comments)    From childhood  . Phenytoin Sodium Extended Other (See Comments)    Affects liver  . Pravastatin Other (See Comments)    Affected legs  . Crestor [Rosuvastatin]     Muscle aches even on 5mg  once weekly  . Gabapentin Other (See Comments)    Shaky, swollen legs  . Prolia [Denosumab] Other (See Comments)    Joints and muscle aches, loss of hair  . Boniva [Ibandronic Acid] Other (See Comments)    headaches  . Clarithromycin Rash  . Lescol [Fluvastatin Sodium] Other (See Comments)    Affects legs  . Sulfa Antibiotics Rash  . Welchol [Colesevelam Hcl] Other (See Comments)    No energy  . Zetia [Ezetimibe] Other (See Comments)    myalgias  . Zithromax [Azithromycin] Rash    Assessment/Plan:

## 2016-07-19 ENCOUNTER — Ambulatory Visit (INDEPENDENT_AMBULATORY_CARE_PROVIDER_SITE_OTHER): Payer: Medicare Other | Admitting: Pharmacist

## 2016-07-19 VITALS — BP 164/58 | HR 66

## 2016-07-19 DIAGNOSIS — I2510 Atherosclerotic heart disease of native coronary artery without angina pectoris: Secondary | ICD-10-CM | POA: Diagnosis not present

## 2016-07-19 DIAGNOSIS — I6523 Occlusion and stenosis of bilateral carotid arteries: Secondary | ICD-10-CM

## 2016-07-19 DIAGNOSIS — E7800 Pure hypercholesterolemia, unspecified: Secondary | ICD-10-CM

## 2016-07-19 NOTE — Patient Instructions (Addendum)
Check your blood pressure at home daily (if able) and keep record of the readings.  Take your BP meds as follows: Continue all medications as prescribed.   Bring all of your meds, your BP cuff and your record of home blood pressures to your next appointment.  Exercise as you're able, try to walk approximately 30 minutes per day.  Keep salt intake to a minimum, especially watch canned and prepared boxed foods.  Eat more fresh fruits and vegetables and fewer canned items.  Avoid eating in fast food restaurants.    HOW TO TAKE YOUR BLOOD PRESSURE: . Rest 5 minutes before taking your blood pressure. .  Don't smoke or drink caffeinated beverages for at least 30 minutes before. . Take your blood pressure before (not after) you eat. . Sit comfortably with your back supported and both feet on the floor (don't cross your legs). . Elevate your arm to heart level on a table or a desk. . Use the proper sized cuff. It should fit smoothly and snugly around your bare upper arm. There should be enough room to slip a fingertip under the cuff. The bottom edge of the cuff should be 1 inch above the crease of the elbow. . Ideally, take 3 measurements at one sitting and record the average.    Cholesterol Cholesterol is a white, waxy, fat-like substance that is needed by the human body in small amounts. The liver makes all the cholesterol we need. Cholesterol is carried from the liver by the blood through the blood vessels. Deposits of cholesterol (plaques) may build up on blood vessel (artery) walls. Plaques make the arteries narrower and stiffer. Cholesterol plaques increase the risk for heart attack and stroke. You cannot feel your cholesterol level even if it is very high. The only way to know that it is high is to have a blood test. Once you know your cholesterol levels, you should keep a record of the test results. Work with your health care provider to keep your levels in the desired range. What do the results  mean?  Total cholesterol is a rough measure of all the cholesterol in your blood.  LDL (low-density lipoprotein) is the "bad" cholesterol. This is the type that causes plaque to build up on the artery walls. You want this level to be low.  HDL (high-density lipoprotein) is the "good" cholesterol because it cleans the arteries and carries the LDL away. You want this level to be high.  Triglycerides are fat that the body can either burn for energy or store. High levels are closely linked to heart disease. What are the desired levels of cholesterol?  Total cholesterol below 200.  LDL below 100 for people who are at risk, below 70 for people at very high risk.  HDL above 40 is good. A level of 60 or higher is considered to be protective against heart disease.  Triglycerides below 150. How can I lower my cholesterol? Diet  Follow your diet program as told by your health care provider.  Choose fish or white meat chicken and [place], roasted or baked. Limit fatty cuts of red meat, fried foods, and processed meats, such as sausage and lunch meats.  Eat lots of fresh fruits and vegetables.  Choose whole grains, beans, pasta, potatoes, and cereals.  Choose olive oil, corn oil, or canola oil, and use only small amounts.  Avoid butter, mayonnaise, shortening, or palm kernel oils.  Avoid foods with trans fats.  Drink skim or nonfat milk and  eat low-fat or nonfat yogurt and cheeses. Avoid whole milk, cream, ice cream, egg yolks, and full-fat cheeses.  Healthier desserts include angel food cake, ginger snaps, animal crackers, hard candy, popsicles, and low-fat or nonfat frozen yogurt. Avoid pastries, cakes, pies, and cookies. Exercise  Follow your exercise program as told by your health care provider. A regular program:  Helps to decrease LDL and raise HDL.  Helps with weight control.  Do things that increase your activity level, such as gardening, walking, and taking the stairs.  Ask  your health care provider about ways that you can be more active in your daily life. Medicine  Take over-the-counter and prescription medicines only as told by your health care provider.  Medicine may be prescribed by your health care provider to help lower cholesterol and decrease the risk for heart disease. This is usually done if diet and exercise have failed to bring down cholesterol levels.  If you have several risk factors, you may need medicine even if your levels are normal. This information is not intended to replace advice given to you by your health care provider. Make sure you discuss any questions you have with your health care provider. Document Released: 02/27/2001 Document Revised: 12/31/2015 Document Reviewed: 12/03/2015 Elsevier Interactive Patient Education  2017 [employer].

## 2016-07-19 NOTE — Progress Notes (Signed)
Patient ID: [name]                 DOB: 14-Feb-1929                    MRN: [id number]     HPI: [name] is a 83 y.o. female patient of Dr. Radford Pax. PMH is significant for CVD, CABG and CAD with 50% stenosis, DM, and HLD. She has previously been seen in lipid clinic and has multiple statin intolerances. Most recently, patient stopped her brand Crestor 5mg  once weekly because of myalgias. Of note, pt has over 20 medication allergies listed.  She presents today and states that her legs are weak and she has experienced an increase in dry mouth with higher doses of clonidine (BID) and amlodipine (before lunch). Both of these medications were titrated at her visit with Dr. Radford Pax about 1 week ago. She denies dizziness or other symptoms. Her home pressures have come down nicely despite being elevated in the office today. Prior to dose increase her home pressures were mostly 150s/70s. Since dose increase her pressures are mostly running 130s/60s-70s.  Current Medications: none  Intolerances: rosuvastatin 5mg  once weekly (even brand Crestor), atorvastatin, lovastatin, pravastatin, fluvastatin, Welchol, Zetia - myalgias Risk Factors: CABG, CAD with 50% stenosis LDL goal: < 70mg /dL   Diet: Gluten free, looking for a dietician.  Exercise: Balance problem, knee replacement, has pain at rest and does not get much activity.  Family History: Mother with DM, brother with heart disease, sister with DM.  Social History: Pt does not smoke cigarettes, does not drink alcohol, and does not use illicit drugs.  Labs: 10/24/15: TC 188, TG 169, HDL 57, LDL 97, ALT normal (on no lipid-lowering therapy)  Past Medical History:  Diagnosis Date  . Carotid artery occlusion    1-39% bilateral carotid stenosis by dopplers 05/2016  . Celiac sprue   . Coronary artery disease    s/p CABG with 50% stenosis at ostium of the SVG to dirst diagonal, patent LIMA to LAD and patent left circ with nonobstructive ASCAD of  the RCA  . Diabetes mellitus    diet controlled  . Diverticulitis   . Gait disorder   . GERD (gastroesophageal reflux disease)   . Hyperchloremia   . Hyperlipidemia    statin intolerant  . Hypertension   . Meningioma ([place])   . Obesity   . Rosacea     Current Outpatient Prescriptions on File Prior to Visit  Medication Sig Dispense Refill  . amLODipine (NORVASC) 10 MG tablet Take 1 tablet (10 mg total) by mouth daily. 30 tablet 11  . Ascorbic Acid (VITAMIN C PO) Take 1 tablet by mouth daily at 3 pm.    . aspirin EC 81 MG tablet Take 81 mg by mouth daily at 3 pm.    . Biotin 5000 MCG TABS Take 5,000 mcg by mouth once a week.     . Calcium Citrate (CITRACAL PO) Take 1 tablet by mouth daily at 3 pm.     . Cholecalciferol 4000 UNITS CAPS Take 4,000 Units by mouth daily at 3 pm.    . clindamycin (CLEOCIN) 150 MG capsule Take 150 mg by mouth as needed. Before dentist appointment    . cloNIDine (CATAPRES) 0.2 MG tablet Take 1 tablet (0.2 mg total) by mouth 2 (two) times daily. 60 tablet 11  . furosemide (LASIX) 40 MG tablet Take 40 mg by mouth 2 (two) times daily. Morning and mid  evening    . irbesartan (AVAPRO) 300 MG tablet Take 300 mg by mouth daily.    Marland Kitchen levothyroxine (SYNTHROID, LEVOTHROID) 25 MCG tablet Take 50 mg by mouth daily.     Marland Kitchen METRONIDAZOLE, TOPICAL, 0.75 % LOTN Apply 1 application topically at bedtime as needed (rosacea).    . Multiple Vitamin (MULITIVITAMIN WITH MINERALS) TABS Take 1 tablet by mouth daily at 3 pm.     . pantoprazole (PROTONIX) 40 MG tablet Take 40 mg by mouth daily before breakfast.    . potassium chloride (KLOR-CON) 8 MEQ tablet Take 8 mEq by mouth.  11  . pyridOXINE (VITAMIN B-6) 100 MG tablet Take 100 mg by mouth once a week. On Thursday    . vitamin B-12 (CYANOCOBALAMIN) 500 MCG tablet Take 500 mcg by mouth once a week. On Thursdays     No current facility-administered medications on file prior to visit.     Allergies  Allergen Reactions  .  Accutane [Isotretinoin] Other (See Comments)    Affected liver  . Ciprofloxacin Rash and Other (See Comments)    Kidney failure  . Erythromycin Diarrhea and Rash  . Gluten Diarrhea and Other (See Comments)    Celiac sprue  . Isotretinoin Other (See Comments)    Affects liver  . Lipitor [Atorvastatin] Other (See Comments)    Affects legs  . Lisinopril Other (See Comments)    Pt doesn't remember reaction   . Lovastatin Other (See Comments)    Affects legs  . Penicillins Other (See Comments)    From childhood  . Phenytoin Sodium Extended Other (See Comments)    Affects liver  . Pravastatin Other (See Comments)    Affected legs  . Crestor [Rosuvastatin]     Muscle aches even on 5mg  once weekly  . Gabapentin Other (See Comments)    Shaky, swollen legs  . Prolia [Denosumab] Other (See Comments)    Joints and muscle aches, loss of hair  . Boniva [Ibandronic Acid] Other (See Comments)    headaches  . Clarithromycin Rash  . Lescol [Fluvastatin Sodium] Other (See Comments)    Affects legs  . Sulfa Antibiotics Rash  . Welchol [Colesevelam Hcl] Other (See Comments)    No energy  . Zetia [Ezetimibe] Other (See Comments)    myalgias  . Zithromax [Azithromycin] Rash    Assessment/Plan: Hyperlipidemia: LDL not at goal <70 on most recent lipid panel. We discussed option of PCSK9i since patient is statin intolerant. Patient believes that even the low end of the copay will be cost-prohibitive for her. We discussed possibility of trial (her age may limit available trials) and patient is not interested in participating in a trial at this time.  Hypertension: BP elevated today, but much improved with home measurements. She has previously had her cuff verified as accurate. Will continue current regimen. Advised to try biotene for dry mouth and call if it becomes too bothersome.   Thank you,  Lelan Pons. Patterson Hammersmith, [facility]  07/19/2016 12:05 PM

## 2016-08-02 ENCOUNTER — Other Ambulatory Visit: Payer: Self-pay | Admitting: Family Medicine

## 2016-08-02 DIAGNOSIS — Z1231 Encounter for screening mammogram for malignant neoplasm of breast: Secondary | ICD-10-CM

## 2016-08-17 DIAGNOSIS — Z85828 Personal history of other malignant neoplasm of skin: Secondary | ICD-10-CM | POA: Diagnosis not present

## 2016-08-17 DIAGNOSIS — L570 Actinic keratosis: Secondary | ICD-10-CM | POA: Diagnosis not present

## 2016-09-14 ENCOUNTER — Ambulatory Visit
Admission: RE | Admit: 2016-09-14 | Discharge: 2016-09-14 | Disposition: A | Discharge status: Home / Self Care | Payer: Medicare Other | Source: Ambulatory Visit | Attending: Family Medicine | Admitting: Family Medicine

## 2016-09-14 DIAGNOSIS — Z1231 Encounter for screening mammogram for malignant neoplasm of breast: Secondary | ICD-10-CM | POA: Diagnosis not present

## 2016-09-20 DIAGNOSIS — E1151 Type 2 diabetes mellitus with diabetic peripheral angiopathy without gangrene: Secondary | ICD-10-CM | POA: Diagnosis not present

## 2016-09-20 DIAGNOSIS — L84 Corns and callosities: Secondary | ICD-10-CM | POA: Diagnosis not present

## 2016-09-20 DIAGNOSIS — I739 Peripheral vascular disease, unspecified: Secondary | ICD-10-CM | POA: Diagnosis not present

## 2016-09-20 DIAGNOSIS — L603 Nail dystrophy: Secondary | ICD-10-CM | POA: Diagnosis not present

## 2016-10-29 DIAGNOSIS — Z6833 Body mass index (BMI) 33.0-33.9, adult: Secondary | ICD-10-CM | POA: Diagnosis not present

## 2016-10-29 DIAGNOSIS — N182 Chronic kidney disease, stage 2 (mild): Secondary | ICD-10-CM | POA: Diagnosis not present

## 2016-10-29 DIAGNOSIS — I129 Hypertensive chronic kidney disease with stage 1 through stage 4 chronic kidney disease, or unspecified chronic kidney disease: Secondary | ICD-10-CM | POA: Diagnosis not present

## 2016-10-31 ENCOUNTER — Other Ambulatory Visit: Payer: Self-pay | Admitting: Family Medicine

## 2016-10-31 DIAGNOSIS — M810 Age-related osteoporosis without current pathological fracture: Secondary | ICD-10-CM

## 2016-11-06 ENCOUNTER — Ambulatory Visit
Admission: RE | Admit: 2016-11-06 | Discharge: 2016-11-06 | Disposition: A | Discharge status: Home / Self Care | Payer: Medicare Other | Source: Ambulatory Visit | Attending: Family Medicine | Admitting: Family Medicine

## 2016-11-06 DIAGNOSIS — M810 Age-related osteoporosis without current pathological fracture: Secondary | ICD-10-CM | POA: Diagnosis not present

## 2016-11-14 DIAGNOSIS — N182 Chronic kidney disease, stage 2 (mild): Secondary | ICD-10-CM | POA: Diagnosis not present

## 2016-11-20 DIAGNOSIS — Z471 Aftercare following joint replacement surgery: Secondary | ICD-10-CM | POA: Diagnosis not present

## 2016-11-20 DIAGNOSIS — Z96651 Presence of right artificial knee joint: Secondary | ICD-10-CM | POA: Diagnosis not present

## 2016-11-21 DIAGNOSIS — H1859 Other hereditary corneal dystrophies: Secondary | ICD-10-CM | POA: Diagnosis not present

## 2016-11-21 DIAGNOSIS — Z961 Presence of intraocular lens: Secondary | ICD-10-CM | POA: Diagnosis not present

## 2016-11-23 ENCOUNTER — Telehealth: Payer: Self-pay | Admitting: Cardiology

## 2016-11-23 NOTE — Telephone Encounter (Signed)
New Message   Call after 1pm , she is going out to lunch   Pt c/o medication issue:  1. Name of Medication:  amLODipine (NORVASC) 10 MG tablet Take 1 tablet (10 mg total) by mouth daily.   cloNIDine (CATAPRES) 0.2 MG tablet Take 1 tablet (0.2 mg total) by mouth 2 (two) times daily.    2. How are you currently taking this medication (dosage and times per day)?  As prescribed  3. Are you having a reaction (difficulty breathing--STAT)? Burning her lips and mouth  4. What is your medication issue?  Wants to go back to old way of taking medication

## 2016-11-23 NOTE — Telephone Encounter (Signed)
Patient called concerned because the inside of her lips are raw and feel "chewed up" and painful." She blames her clonidine and amlodipine that were increased in January.  She states the raw lips started 2 months ago. She reports her blood pressure has been in the 130s/80s. Since she was on the increased dose for 3 months prior to symptoms onset, instructed patient to consult her dentist prior to changing medications. She states she has no dentist, so instructed her to call PCP for evaluation.  She understands she will be called if Dr. Radford Pax has further recommendations at this time.

## 2016-11-28 DIAGNOSIS — E118 Type 2 diabetes mellitus with unspecified complications: Secondary | ICD-10-CM | POA: Diagnosis not present

## 2016-11-28 DIAGNOSIS — N189 Chronic kidney disease, unspecified: Secondary | ICD-10-CM | POA: Diagnosis not present

## 2016-11-28 DIAGNOSIS — E039 Hypothyroidism, unspecified: Secondary | ICD-10-CM | POA: Diagnosis not present

## 2016-11-28 DIAGNOSIS — I10 Essential (primary) hypertension: Secondary | ICD-10-CM | POA: Diagnosis not present

## 2016-11-28 DIAGNOSIS — M810 Age-related osteoporosis without current pathological fracture: Secondary | ICD-10-CM | POA: Diagnosis not present

## 2016-11-28 DIAGNOSIS — E1165 Type 2 diabetes mellitus with hyperglycemia: Secondary | ICD-10-CM | POA: Diagnosis not present

## 2016-11-28 DIAGNOSIS — D649 Anemia, unspecified: Secondary | ICD-10-CM | POA: Diagnosis not present

## 2016-12-20 DIAGNOSIS — L603 Nail dystrophy: Secondary | ICD-10-CM | POA: Diagnosis not present

## 2016-12-20 DIAGNOSIS — I739 Peripheral vascular disease, unspecified: Secondary | ICD-10-CM | POA: Diagnosis not present

## 2016-12-20 DIAGNOSIS — E1151 Type 2 diabetes mellitus with diabetic peripheral angiopathy without gangrene: Secondary | ICD-10-CM | POA: Diagnosis not present

## 2016-12-20 DIAGNOSIS — L84 Corns and callosities: Secondary | ICD-10-CM | POA: Diagnosis not present

## 2017-01-06 DIAGNOSIS — E118 Type 2 diabetes mellitus with unspecified complications: Secondary | ICD-10-CM | POA: Insufficient documentation

## 2017-01-06 NOTE — Progress Notes (Signed)
Cardiology Office Note:    Date:  01/07/2017   ID:  [name], DOB June 10, 1929, MRN [id number]  PCP:  Hulan Fess, MD  Cardiologist:  Fransico Him, MD   Referring MD: Hulan Fess, MD   Chief Complaint  Patient presents with  . Coronary Artery Disease  . Hypertension  . Hyperlipidemia    History of Present Illness:    [name] is a 83 y.o. female with a hx of with a history of ASCAD s/p CABG (repeat cath showing 50% ostial SVG to D1, patent LIMA to LAD and patent LCx with nonobstructive disease of the RCA), HTN and dyslipidemia.  She also has mild carotid artery stenosis 1-39% by dopplers 05/2016.    She is here today for followup and is doing well from a cardiac standpoint. She denies any chest pain or pressure, SOB, DOE, PND, orthopnea, dizziness, palpitations, claudication or syncope. She has chronic LE edema which she thinks has gotten worse since starting on the BB.  She uses compression hose at home.  She brought her BPs in today which range from 133-158/53-48mmHg. She was recently placed on metoprolol and has been having a lot of itching.  She also got a bug bite last night and has a huge erythemetous swollen area on her right thigh.  She continues to walk 3 times weekly in a class for exercise and some at home.  Past Medical History:  Diagnosis Date  . Carotid artery occlusion    1-39% bilateral carotid stenosis by dopplers 05/2016  . Celiac sprue   . Coronary artery disease    s/p CABG with 50% stenosis at ostium of the SVG to dirst diagonal, patent LIMA to LAD and patent left circ with nonobstructive ASCAD of the RCA  . Diabetes mellitus    diet controlled  . Diverticulitis   . Gait disorder   . GERD (gastroesophageal reflux disease)   . Hyperchloremia   . Hyperlipidemia    statin intolerant  . Hypertension   . Meningioma ([place])   . Obesity   . Rosacea     Past Surgical History:  Procedure Laterality Date  . ABDOMINAL HYSTERECTOMY    . BRAIN TUMOR  EXCISION  1993   meningioma, left frontal  . BUNIONECTOMY    . CARDIAC CATHETERIZATION    . CORONARY ARTERY BYPASS GRAFT  2002    3 vessel  . JOINT REPLACEMENT  2009  . REPLACEMENT TOTAL KNEE    . ROTATOR CUFF REPAIR  2010   Dr. Gladstone Lighter  . TONSILLECTOMY      Current Medications: Current Meds  Medication Sig  . amLODipine (NORVASC) 10 MG tablet Take 1 tablet (10 mg total) by mouth daily.  . Ascorbic Acid (VITAMIN C PO) Take 1 tablet by mouth daily at 3 pm.  . aspirin EC 81 MG tablet Take 81 mg by mouth daily at 3 pm.  . Biotin 5000 MCG TABS Take 5,000 mcg by mouth once a week.   . Calcium Citrate (CITRACAL PO) Take 1 tablet by mouth daily at 3 pm.   . Cholecalciferol 4000 UNITS CAPS Take 4,000 Units by mouth daily at 3 pm.  . clindamycin (CLEOCIN) 150 MG capsule Take 150 mg by mouth as needed. Before dentist appointment  . cloNIDine (CATAPRES) 0.2 MG tablet Take 1 tablet (0.2 mg total) by mouth 2 (two) times daily.  . furosemide (LASIX) 40 MG tablet Take 40 mg by mouth 2 (two) times daily. Morning and mid evening  .  irbesartan (AVAPRO) 300 MG tablet Take 300 mg by mouth daily.  Marland Kitchen levothyroxine (SYNTHROID, LEVOTHROID) 25 MCG tablet Take 50 mg by mouth daily.   . metoprolol succinate (TOPROL-XL) 25 MG 24 hr tablet Take 25 mg by mouth daily.   Marland Kitchen METRONIDAZOLE, TOPICAL, 0.75 % LOTN Apply 1 application topically at bedtime as needed (rosacea).  . Multiple Vitamin (MULITIVITAMIN WITH MINERALS) TABS Take 1 tablet by mouth daily at 3 pm.   . pantoprazole (PROTONIX) 40 MG tablet Take 40 mg by mouth daily before breakfast.  . potassium chloride (KLOR-CON) 8 MEQ tablet Take 8 mEq by mouth.  . pyridOXINE (VITAMIN B-6) 100 MG tablet Take 100 mg by mouth once a week. On Thursday  . vitamin B-12 (CYANOCOBALAMIN) 500 MCG tablet Take 500 mcg by mouth once a week. On Thursdays     Allergies:   Accutane [isotretinoin]; Ciprofloxacin; Erythromycin; Gluten; Isotretinoin; Lipitor [atorvastatin];  Lisinopril; Lovastatin; Penicillins; Phenytoin sodium extended; Pravastatin; Crestor [rosuvastatin]; Gabapentin; Prolia [denosumab]; Boniva [ibandronic acid]; Clarithromycin; Lescol [fluvastatin sodium]; Sulfa antibiotics; Welchol [colesevelam hcl]; Zetia [ezetimibe]; and Zithromax [azithromycin]   Social History   Social History  . Marital status: Widowed    Spouse name: N/A  . Number of children: 4  . Years of education: 66   Occupational History  . Retired Retired   Social History Main Topics  . Smoking status: Never Smoker  . Smokeless tobacco: Never Used  . Alcohol use No  . Drug use: No  . Sexual activity: Not Asked   Other Topics Concern  . None   Social History Narrative  . None     Family History: The patient's family history includes Diabetes in her mother, sister, and sister; Heart disease in her brother.  ROS:   Please see the history of present illness.     All other systems reviewed and are negative.  EKGs/Labs/Other Studies Reviewed:    The following studies were reviewed today: none  EKG:  EKG is  ordered today.  The ekg ordered today demonstrates NSR at 67bpm with nonspecific ST/T wave abnormality  Recent Labs: No results found for requested labs within last 8760 hours.   Recent Lipid Panel    Component Value Date/Time   CHOL 188 10/24/2015 0926   TRIG 169 (H) 10/24/2015 0926   HDL 57 10/24/2015 0926   CHOLHDL 3.3 10/24/2015 0926   VLDL 34 (H) 10/24/2015 0926   LDLCALC 97 10/24/2015 0926   LDLDIRECT 133.9 03/24/2014 0824    Physical Exam:    VS:  BP (!) 158/64   Pulse 67   Ht 5\' 3"  (1.6 m)   Wt 192 lb 12.8 oz (87.5 kg)   BMI 34.15 kg/m     Wt Readings from Last 3 Encounters:  01/07/17 192 lb 12.8 oz (87.5 kg)  07/09/16 189 lb (85.7 kg)  11/15/15 194 lb 6.4 oz (88.2 kg)     GEN:  Well nourished, well developed in no acute distress HEENT: Normal NECK: No JVD; No carotid bruits LYMPHATICS: No lymphadenopathy CARDIAC: RRR, no  murmurs, rubs, gallops RESPIRATORY:  Clear to auscultation without rales, wheezing or rhonchi  ABDOMEN: Soft, non-tender, non-distended MUSCULOSKELETAL:  No edema; No deformity  SKIN: Warm and dry NEUROLOGIC:  Alert and oriented x 3 PSYCHIATRIC:  Normal affect   ASSESSMENT:    1. Coronary artery disease involving native coronary artery of native heart without angina pectoris   2. Essential hypertension   3. Bilateral carotid artery stenosis   4. Pure hypercholesterolemia  5. Edema extremities   6. Type 2 diabetes mellitus with complication, unspecified whether long term insulin use (HCC)    PLAN:    In order of problems listed above:  1.  ASCAD s/p CABG with last cath showing  50% stenosis at ostium of the SVG to dirst diagonal, patent LIMA to LAD and patent left circ with nonobstructive ASCAD of the RCA. She has not had any anginal symptoms.  She will continue on ASA 81mg  daily.  She is statin intolerant and PCSK 9i is cost prohibitive.   2.  HTN - her BP is not controlled on exam today but has not taken her meds yet.Marland Kitchen  She will continue on Clonidine 0.2mg  BID, amlodipine 10mg  daily, Irbesartan 300mg  daily.  She was placed on metoprolol for HTN but it is causing severe skin itching, worsening LE edema and lip swelling.  I will stop her metoprolol and start hydralazine 10mg  TID.  I will have her followup in HTN clinic in 1 week.    3,.  Bilateral carotid artery stenosis (1-39% by dopplers 05/2016) - repeat dopplers in 2 years.  She will continue ASA 81mg  daily.   4.  Hyperlipidemia with LDL goal < 70.  She is statin intolerant and was referred to lipid clinic for initiation of PCSK 9 inhibitor but it was cost prohibitive for her.  She was also not interested in any trial.    5.  Chronic LE edema - this appears worse recently.  I have asked her to increase her Lasix to 40mg  BID for 3 days and then back to 40mg  daily.  I will check a BMET today.    6.  DM type 2 - she will continue to  be followed by her PCP  7.  Right leg cellulitis secondary to insect bite - I am sending her over to see her PCP today for evaluation..    Medication Adjustments/Labs and Tests Ordered: Current medicines are reviewed at length with the patient today.  Concerns regarding medicines are outlined above.  No orders of the defined types were placed in this encounter.  No orders of the defined types were placed in this encounter.   Signed, Fransico Him, MD  01/07/2017 8:56 AM    [place]

## 2017-01-07 ENCOUNTER — Encounter: Payer: Self-pay | Admitting: Cardiology

## 2017-01-07 ENCOUNTER — Encounter (INDEPENDENT_AMBULATORY_CARE_PROVIDER_SITE_OTHER): Payer: Self-pay

## 2017-01-07 ENCOUNTER — Ambulatory Visit (INDEPENDENT_AMBULATORY_CARE_PROVIDER_SITE_OTHER): Payer: Medicare Other | Admitting: Cardiology

## 2017-01-07 VITALS — BP 158/64 | HR 67 | Ht 63.0 in | Wt 192.8 lb

## 2017-01-07 DIAGNOSIS — I2510 Atherosclerotic heart disease of native coronary artery without angina pectoris: Secondary | ICD-10-CM

## 2017-01-07 DIAGNOSIS — E118 Type 2 diabetes mellitus with unspecified complications: Secondary | ICD-10-CM

## 2017-01-07 DIAGNOSIS — I6523 Occlusion and stenosis of bilateral carotid arteries: Secondary | ICD-10-CM | POA: Diagnosis not present

## 2017-01-07 DIAGNOSIS — I10 Essential (primary) hypertension: Secondary | ICD-10-CM | POA: Diagnosis not present

## 2017-01-07 DIAGNOSIS — S70362A Insect bite (nonvenomous), left thigh, initial encounter: Secondary | ICD-10-CM | POA: Diagnosis not present

## 2017-01-07 DIAGNOSIS — R600 Localized edema: Secondary | ICD-10-CM | POA: Diagnosis not present

## 2017-01-07 DIAGNOSIS — S70361A Insect bite (nonvenomous), right thigh, initial encounter: Secondary | ICD-10-CM | POA: Diagnosis not present

## 2017-01-07 DIAGNOSIS — E7800 Pure hypercholesterolemia, unspecified: Secondary | ICD-10-CM | POA: Diagnosis not present

## 2017-01-07 LAB — BASIC METABOLIC PANEL
BUN/Creatinine Ratio: 7 — ABNORMAL LOW (ref 12–28)
BUN: 12 mg/dL (ref 8–27)
CALCIUM: 9.9 mg/dL (ref 8.7–10.3)
CHLORIDE: 100 mmol/L (ref 96–106)
CO2: 29 mmol/L (ref 20–29)
Creatinine, Ser: 1.64 mg/dL — ABNORMAL HIGH (ref 0.57–1.00)
GFR calc non Af Amer: 28 mL/min/{1.73_m2} — ABNORMAL LOW (ref >59)
GFR, EST AFRICAN AMERICAN: 32 mL/min/{1.73_m2} — AB (ref >59)
Glucose: 119 mg/dL — ABNORMAL HIGH (ref 65–99)
Potassium: 4.2 mmol/L (ref 3.5–5.2)
Sodium: 140 mmol/L (ref 134–144)

## 2017-01-07 MED ORDER — FUROSEMIDE 40 MG PO TABS: 40.0000 mg | ORAL_TABLET | Freq: Every day | ORAL | 11 refills | Status: AC

## 2017-01-07 MED ORDER — HYDRALAZINE HCL 10 MG PO TABS: 10.0000 mg | ORAL_TABLET | Freq: Three times a day (TID) | ORAL | 11 refills | Status: DC

## 2017-01-07 NOTE — Patient Instructions (Addendum)
Medication Instructions:  1) STOP METOPROLOL 2) START HYDRALAZINE 10 mg THREE TIMES DAILY  3) INCREASE LASIX to 80 mg TWICE DAILY for 2 days then resume 40 mg twice daily  Labwork: TODAY: BMET  Testing/Procedures: None  Follow-Up: CALL YOUR PRIMARY CARE PHYSICIAN ASAP FOR POSSIBLE LEG INFECTION.  Your physician recommends that you schedule a follow-up appointment in 1-2 weeks in the HTN CLINIC.  Your physician recommends that you schedule a follow-up appointment in 3 MONTHS with Dr. Theodosia Blender assistant.  Your physician wants you to follow-up in: 6 months with Dr. Radford Pax. You will receive a reminder letter in the mail two months in advance. If you don't receive a letter, please call our office to schedule the follow-up appointment.   Any Other Special Instructions Will Be Listed Below (If Applicable).     If you need a refill on your cardiac medications before your next appointment, please call your pharmacy.

## 2017-01-08 ENCOUNTER — Telehealth: Payer: Self-pay | Admitting: Cardiology

## 2017-01-08 ENCOUNTER — Telehealth: Payer: Self-pay

## 2017-01-08 DIAGNOSIS — R600 Localized edema: Secondary | ICD-10-CM

## 2017-01-08 MED ORDER — AMLODIPINE BESYLATE 5 MG PO TABS: 5.0000 mg | ORAL_TABLET | Freq: Every day | ORAL | 3 refills | Status: DC

## 2017-01-08 MED ORDER — HYDRALAZINE HCL 25 MG PO TABS: 25.0000 mg | ORAL_TABLET | Freq: Three times a day (TID) | ORAL | 3 refills | Status: DC

## 2017-01-08 NOTE — Telephone Encounter (Signed)
-----   Message from Sueanne Margarita, MD sent at 01/07/2017  8:04 PM EDT ----- Creatinine increased but could be due to volume overload.  Please have her come in for BNP and BMET in 2 days after finishing 2 days of higher lasix dose.  ALso have her decrease amlodipine to 5mg  daily and increase Hydralazine to 25mg  TID to see if this helps with LE edema.

## 2017-01-08 NOTE — Addendum Note (Signed)
Addended by: Patterson Hammersmith A on: 01/08/2017 01:22 PM   Modules accepted: Orders

## 2017-01-08 NOTE — Telephone Encounter (Signed)
New Message  Pt call requesting to speak with RN about to medications that were changed. Pt states the RN will know what she is speaking off. Please call back to discuss

## 2017-01-08 NOTE — Telephone Encounter (Signed)
Informed patient of results and verbal understanding expressed.  Instructed patient to DECREASE AMLODIPINE to 5 mg daily. Instructed patient to INCREASE HYDRALAZINE to 25 mg TID. Rescheduled HTN Clinic to 7/31 for check and repeat labs for patient convenience.  Patient agrees with treatment plan.

## 2017-01-08 NOTE — Telephone Encounter (Signed)
Patient wanted to confirm medication changes again and upcoming appointments.  She has no more questions and was grateful for call.

## 2017-01-11 DIAGNOSIS — R030 Elevated blood-pressure reading, without diagnosis of hypertension: Secondary | ICD-10-CM | POA: Diagnosis not present

## 2017-01-11 DIAGNOSIS — L03115 Cellulitis of right lower limb: Secondary | ICD-10-CM | POA: Diagnosis not present

## 2017-01-14 DIAGNOSIS — W57XXXA Bitten or stung by nonvenomous insect and other nonvenomous arthropods, initial encounter: Secondary | ICD-10-CM | POA: Diagnosis not present

## 2017-01-14 DIAGNOSIS — L03119 Cellulitis of unspecified part of limb: Secondary | ICD-10-CM | POA: Diagnosis not present

## 2017-01-14 NOTE — Progress Notes (Signed)
Patient ID: [name]                 DOB: 08-Jun-1929                      MRN: [id number]     HPI: [name] is a 83 y.o. female patient of Dr. Radford Pax who presents today for hypertension evaluation. PMH includes ASCAD s/p CABG (repeat cath showing 50% ostial SVG to D1, patent LIMA to LAD and patent LCx with nonobstructive disease of the RCA), HTN and dyslipidemia. At her most recent OV her pressure was not at goal; she had not yet taken her medications. She reported adverse reactions with metoprolol and this was discontinued and she was started on hydralazine. Based on lab results her amlodipine was reduced and hydralazine increased.   Today she presents assisted only with 2 canes. She states that Dr. Radford Pax and Dr. Rex Kras have adjusted her medications so much she is unsure what she is taking, but then tells me the list provided matches what she is taking. However, she reports that she has been taking clonidine 0.1mg  BID not 0.2mg  BID.   She state she has been tired recently, but he biggest concern is her swollen legs. She states this has been going on for some time now. Of note she has not been wearing her compression stockings for several weeks now due to "bites." She states she has been seen twice now and started on triamcinolone cream and clindamycin 300mg  Q8H for 10 days. She states she stopped wearing compression stockings due to the cream.   She believes the hydralazine is causing her to be jittery and nervous. She also states that a 3 time a day medication is inconvenient.   Current HTN meds:  Irbesartan 300mg  daily  Hydralazine 25mg  TID Furosemide 40mg  daily  Clonidine 0.2mg  BID - Dr. Rex Kras changed to 0.1mg  BID in May Amlodipine 5mg  daily   Previously tried: lisinopril - unknown, metoprolol - severe skin itching, worsening edema and lip swelling  BP goal: <150/90  Family History: Diabetes in her mother, sister, and sister; Heart disease in her brother.  Social History: Denies  tobacco and alcohol.   Diet: Most meals at home. Rarely adds salt. 1-2 cups of decaf coffee per day.   Exercise: Exercises 3 days a week. Walking mostly.   Home BP readings: 150s/60-70s Her cuff today - 183/73 96 - cuff appears to be very old and she states it is extremely painful when she monitors  Wt Readings from Last 3 Encounters:  01/07/17 192 lb 12.8 oz (87.5 kg)  07/09/16 189 lb (85.7 kg)  11/15/15 194 lb 6.4 oz (88.2 kg)   BP Readings from Last 3 Encounters:  01/15/17 (!) 168/64  01/07/17 (!) 158/64  07/19/16 (!) 164/58   Pulse Readings from Last 3 Encounters:  01/15/17 88  01/07/17 67  07/19/16 66    Renal function: Estimated Creatinine Clearance: 25.8 mL/min (A) (by C-G formula based on SCr of 1.64 mg/dL (H)).  Past Medical History:  Diagnosis Date  . Carotid artery occlusion    1-39% bilateral carotid stenosis by dopplers 05/2016  . Celiac sprue   . Coronary artery disease    s/p CABG with 50% stenosis at ostium of the SVG to dirst diagonal, patent LIMA to LAD and patent left circ with nonobstructive ASCAD of the RCA  . Diabetes mellitus    diet controlled  . Diverticulitis   . Gait disorder   .  GERD (gastroesophageal reflux disease)   . Hyperchloremia   . Hyperlipidemia    statin intolerant  . Hypertension   . Meningioma ([place])   . Obesity   . Rosacea     Current Outpatient Prescriptions on File Prior to Visit  Medication Sig Dispense Refill  . amLODipine (NORVASC) 5 MG tablet Take 1 tablet (5 mg total) by mouth daily. 90 tablet 3  . Ascorbic Acid (VITAMIN C PO) Take 1 tablet by mouth daily at 3 pm.    . aspirin EC 81 MG tablet Take 81 mg by mouth daily at 3 pm.    . Biotin 5000 MCG TABS Take 5,000 mcg by mouth once a week.     . Calcium Citrate (CITRACAL PO) Take 1 tablet by mouth daily at 3 pm.     . Cholecalciferol 4000 UNITS CAPS Take 4,000 Units by mouth daily at 3 pm.    . clindamycin (CLEOCIN) 150 MG capsule Take 150 mg by mouth as needed.  Before dentist appointment    . cloNIDine (CATAPRES) 0.2 MG tablet Take 1 tablet (0.2 mg total) by mouth 2 (two) times daily. (Patient taking differently: Take 0.1 mg by mouth 2 (two) times daily. ) 60 tablet 11  . furosemide (LASIX) 40 MG tablet Take 1 tablet (40 mg total) by mouth daily. Morning and mid evening 30 tablet 11  . hydrALAZINE (APRESOLINE) 25 MG tablet Take 1 tablet (25 mg total) by mouth 3 (three) times daily. 270 tablet 3  . irbesartan (AVAPRO) 300 MG tablet Take 300 mg by mouth daily.    Marland Kitchen levothyroxine (SYNTHROID, LEVOTHROID) 25 MCG tablet Take 50 mg by mouth daily.     Marland Kitchen METRONIDAZOLE, TOPICAL, 0.75 % LOTN Apply 1 application topically at bedtime as needed (rosacea).    . Multiple Vitamin (MULITIVITAMIN WITH MINERALS) TABS Take 1 tablet by mouth daily at 3 pm.     . pantoprazole (PROTONIX) 40 MG tablet Take 40 mg by mouth daily before breakfast.    . potassium chloride (KLOR-CON) 8 MEQ tablet Take 8 mEq by mouth.  11  . pyridOXINE (VITAMIN B-6) 100 MG tablet Take 100 mg by mouth once a week. On Thursday    . vitamin B-12 (CYANOCOBALAMIN) 500 MCG tablet Take 500 mcg by mouth once a week. On Thursdays     No current facility-administered medications on file prior to visit.     Allergies  Allergen Reactions  . Accutane [Isotretinoin] Other (See Comments)    Affected liver  . Ciprofloxacin Rash and Other (See Comments)    Kidney failure  . Erythromycin Diarrhea and Rash  . Gluten Diarrhea and Other (See Comments)    Celiac sprue  . Isotretinoin Other (See Comments)    Affects liver  . Lipitor [Atorvastatin] Other (See Comments)    Affects legs  . Lisinopril Other (See Comments)    Pt doesn't remember reaction   . Lovastatin Other (See Comments)    Affects legs  . Penicillins Other (See Comments)    From childhood  . Phenytoin Sodium Extended Other (See Comments)    Affects liver  . Pravastatin Other (See Comments)    Affected legs  . Crestor [Rosuvastatin]      Muscle aches even on 5mg  once weekly  . Gabapentin Other (See Comments)    Shaky, swollen legs  . Prolia [Denosumab] Other (See Comments)    Joints and muscle aches, loss of hair  . Boniva [Ibandronic Acid] Other (See Comments)  headaches  . Clarithromycin Rash  . Lescol [Fluvastatin Sodium] Other (See Comments)    Affects legs  . Sulfa Antibiotics Rash  . Welchol [Colesevelam Hcl] Other (See Comments)    No energy  . Zetia [Ezetimibe] Other (See Comments)    myalgias  . Zithromax [Azithromycin] Rash    Blood pressure (!) 168/64, pulse 88, SpO2 97 %.   Assessment/Plan: Hypertension: BP is above goal in office, but given intolerance and she just started hydralazine about 1 week ago will continue current regimen. Her home cuff measures about 20 mmHg higher in office today. Recommend she purchase new cuff. Also recommended she start using compression stockings again as this should not affect the cream. Advised to allow cream to "dry" and then apply stockings. She will monitor jitteriness with hydralazine and follow up in 4-6 weeks.   Thank you, Carla Pons. Patterson Bryant, [facility]  01/15/2017 12:23 PM

## 2017-01-15 ENCOUNTER — Other Ambulatory Visit: Payer: Medicare Other

## 2017-01-15 ENCOUNTER — Encounter: Payer: Self-pay | Admitting: Pharmacist

## 2017-01-15 ENCOUNTER — Ambulatory Visit (INDEPENDENT_AMBULATORY_CARE_PROVIDER_SITE_OTHER): Payer: Medicare Other | Admitting: Pharmacist

## 2017-01-15 VITALS — BP 168/64 | HR 88

## 2017-01-15 DIAGNOSIS — I10 Essential (primary) hypertension: Secondary | ICD-10-CM | POA: Diagnosis not present

## 2017-01-15 DIAGNOSIS — R600 Localized edema: Secondary | ICD-10-CM | POA: Diagnosis not present

## 2017-01-15 DIAGNOSIS — I6523 Occlusion and stenosis of bilateral carotid arteries: Secondary | ICD-10-CM

## 2017-01-15 LAB — BASIC METABOLIC PANEL
BUN / CREAT RATIO: 7 — AB (ref 12–28)
BUN: 11 mg/dL (ref 8–27)
CO2: 20 mmol/L (ref 20–29)
CREATININE: 1.61 mg/dL — AB (ref 0.57–1.00)
Calcium: 8.7 mg/dL (ref 8.7–10.3)
Chloride: 99 mmol/L (ref 96–106)
GFR calc Af Amer: 33 mL/min/{1.73_m2} — ABNORMAL LOW (ref >59)
GFR calc non Af Amer: 29 mL/min/{1.73_m2} — ABNORMAL LOW (ref >59)
Glucose: 104 mg/dL — ABNORMAL HIGH (ref 65–99)
Potassium: 4.2 mmol/L (ref 3.5–5.2)
Sodium: 139 mmol/L (ref 134–144)

## 2017-01-15 LAB — PRO B NATRIURETIC PEPTIDE: NT-Pro BNP: 3188 pg/mL — ABNORMAL HIGH (ref 0–738)

## 2017-01-15 NOTE — Patient Instructions (Signed)
Return for a follow up appointment in 4-6 weeks  Check your blood pressure at home daily (if able) and keep record of the readings.  Take your BP meds as follows: CONTINUE all medications as prescribed  Would recommend a new cuff - OMRON is a good brand of cuff  Bring all of your meds, your BP cuff and your record of home blood pressures to your next appointment.  Exercise as you're able, try to walk approximately 30 minutes per day.  Keep salt intake to a minimum, especially watch canned and prepared boxed foods.  Eat more fresh fruits and vegetables and fewer canned items.  Avoid eating in fast food restaurants.    HOW TO TAKE YOUR BLOOD PRESSURE: . Rest 5 minutes before taking your blood pressure. .  Don't smoke or drink caffeinated beverages for at least 30 minutes before. . Take your blood pressure before (not after) you eat. . Sit comfortably with your back supported and both feet on the floor (don't cross your legs). . Elevate your arm to heart level on a table or a desk. . Use the proper sized cuff. It should fit smoothly and snugly around your bare upper arm. There should be enough room to slip a fingertip under the cuff. The bottom edge of the cuff should be 1 inch above the crease of the elbow. . Ideally, take 3 measurements at one sitting and record the average.

## 2017-01-21 ENCOUNTER — Ambulatory Visit: Payer: Medicare Other

## 2017-01-25 DIAGNOSIS — K219 Gastro-esophageal reflux disease without esophagitis: Secondary | ICD-10-CM | POA: Diagnosis not present

## 2017-02-05 ENCOUNTER — Telehealth: Payer: Self-pay | Admitting: Cardiology

## 2017-02-05 NOTE — Telephone Encounter (Signed)
Patient reports she "has just not felt right" since starting hydralazine.  She feels tired and her muscles hurt. She has lost her appetite and people have told her that her "voice sounds different." She states it almost sounds hoarse but she has no cold. She states these issues started when she started Hydralazine, but especially when her dose was increased. She has no BP to report. She understands she will be called with further recommendations.

## 2017-02-05 NOTE — Telephone Encounter (Signed)
Pt c/o medication issue:  1. Name of Medication: Hydralazine 25 mg   2. How are you currently taking this medication (dosage and times per day)? 25 mg and three times a day  3. Are you having a reaction (difficulty breathing--STAT)? no  4. What is your medication issue? Patient thinks that she is reacting to medication, she mentions that she is  tired, muscle pains above the knee, voice is different

## 2017-02-06 NOTE — Telephone Encounter (Signed)
Spoke with patient and she reports that she is tired and has loss of appetite.   She reports that BP has not been much better since starting hydralazine. Pressure below are recent but prior was running about the same.   160/82 153/84 162/86 154/83  HR has been running in 60s.   Will stop hydralazine as she believes this is causing her symptoms. She reports she has been taking furosemide BID prescribed by Dr. Mercy Moore and clonidine 0.1mg  BID also prescribed by Dr. Mercy Moore.   She does not recall any issues with amlodipine thus will increase this to 7.5mg  daily and have her keep follow up in HTN clinic.

## 2017-02-14 DIAGNOSIS — N183 Chronic kidney disease, stage 3 (moderate): Secondary | ICD-10-CM | POA: Diagnosis not present

## 2017-02-20 ENCOUNTER — Ambulatory Visit (INDEPENDENT_AMBULATORY_CARE_PROVIDER_SITE_OTHER): Payer: Medicare Other | Admitting: Pharmacist

## 2017-02-20 VITALS — BP 148/68 | HR 80

## 2017-02-20 DIAGNOSIS — I10 Essential (primary) hypertension: Secondary | ICD-10-CM | POA: Diagnosis not present

## 2017-02-20 DIAGNOSIS — I6523 Occlusion and stenosis of bilateral carotid arteries: Secondary | ICD-10-CM

## 2017-02-20 MED ORDER — AMLODIPINE BESYLATE 5 MG PO TABS: 7.5000 mg | ORAL_TABLET | Freq: Every day | ORAL | 3 refills | Status: DC

## 2017-02-20 NOTE — Progress Notes (Signed)
Patient ID: [name]                 DOB: 1929-05-12                      MRN: [id number]     HPI: [name] is a 83 y.o. female patient of Dr. Radford Pax who presents today for hypertension evaluation. PMH includes ASCAD s/p CABG (repeat cath showing 50% ostial SVG to D1, patent LIMA to LAD and patent LCx with nonobstructive disease of the RCA), HTN and dyslipidemia. She reported adverse reactions with metoprolol and this was discontinued and she was started on hydralazine. Since her most recent visit with HTN clinic she has called and reported adverse events with hydralazine and that it has not changed her blood pressures. This was stopped and her amlodipine was increased slightly to 7.5mg  daily   Today she presents assisted only with 2 canes.    Current HTN meds:  Irbesartan 300mg  daily  Furosemide 40mg  BID Clonidine 0.1mg  BID Amlodipine 7.5mg  daily   Previously tried: lisinopril - unknown, metoprolol - severe skin itching, worsening edema and lip swelling, hydralazine - loss of appetite and tired  BP goal: <150/90  Family History: Diabetes in her mother, sister, and sister; Heart disease in her brother.  Social History: Denies tobacco and alcohol.   Diet: Most meals at home. Rarely adds salt. 1-2 cups of decaf coffee per day.   Exercise: Exercises 3 days a week. Walking mostly.   Home BP readings: 150s/60-70s Her cuff today - 183/73 96 - cuff appears to be very old and she states it is extremely painful when she monitors  Wt Readings from Last 3 Encounters:  01/07/17 192 lb 12.8 oz (87.5 kg)  07/09/16 189 lb (85.7 kg)  11/15/15 194 lb 6.4 oz (88.2 kg)   BP Readings from Last 3 Encounters:  01/15/17 (!) 168/64  01/07/17 (!) 158/64  07/19/16 (!) 164/58   Pulse Readings from Last 3 Encounters:  01/15/17 88  01/07/17 67  07/19/16 66    Renal function: CrCl cannot be calculated (Patient's most recent lab result is older than the maximum 21 days allowed.).  Past  Medical History:  Diagnosis Date  . Carotid artery occlusion    1-39% bilateral carotid stenosis by dopplers 05/2016  . Celiac sprue   . Coronary artery disease    s/p CABG with 50% stenosis at ostium of the SVG to dirst diagonal, patent LIMA to LAD and patent left circ with nonobstructive ASCAD of the RCA  . Diabetes mellitus    diet controlled  . Diverticulitis   . Gait disorder   . GERD (gastroesophageal reflux disease)   . Hyperchloremia   . Hyperlipidemia    statin intolerant  . Hypertension   . Meningioma ([place])   . Obesity   . Rosacea     Current Outpatient Prescriptions on File Prior to Visit  Medication Sig Dispense Refill  . amLODipine (NORVASC) 5 MG tablet Take 1 tablet (5 mg total) by mouth daily. 90 tablet 3  . Ascorbic Acid (VITAMIN C PO) Take 1 tablet by mouth daily at 3 pm.    . aspirin EC 81 MG tablet Take 81 mg by mouth daily at 3 pm.    . Biotin 5000 MCG TABS Take 5,000 mcg by mouth once a week.     . Calcium Citrate (CITRACAL PO) Take 1 tablet by mouth daily at 3 pm.     .  Cholecalciferol 4000 UNITS CAPS Take 4,000 Units by mouth daily at 3 pm.    . clindamycin (CLEOCIN) 150 MG capsule Take 150 mg by mouth as needed. Before dentist appointment    . cloNIDine (CATAPRES) 0.2 MG tablet Take 1 tablet (0.2 mg total) by mouth 2 (two) times daily. (Patient taking differently: Take 0.1 mg by mouth 2 (two) times daily. ) 60 tablet 11  . furosemide (LASIX) 40 MG tablet Take 1 tablet (40 mg total) by mouth daily. Morning and mid evening (Patient taking differently: Take 40 mg by mouth 2 (two) times daily. Morning and mid evening) 30 tablet 11  . irbesartan (AVAPRO) 300 MG tablet Take 300 mg by mouth daily.    Marland Kitchen levothyroxine (SYNTHROID, LEVOTHROID) 25 MCG tablet Take 50 mg by mouth daily.     Marland Kitchen METRONIDAZOLE, TOPICAL, 0.75 % LOTN Apply 1 application topically at bedtime as needed (rosacea).    . Multiple Vitamin (MULITIVITAMIN WITH MINERALS) TABS Take 1 tablet by mouth  daily at 3 pm.     . pantoprazole (PROTONIX) 40 MG tablet Take 40 mg by mouth daily before breakfast.    . potassium chloride (KLOR-CON) 8 MEQ tablet Take 8 mEq by mouth.  11  . pyridOXINE (VITAMIN B-6) 100 MG tablet Take 100 mg by mouth once a week. On Thursday    . vitamin B-12 (CYANOCOBALAMIN) 500 MCG tablet Take 500 mcg by mouth once a week. On Thursdays     No current facility-administered medications on file prior to visit.     Allergies  Allergen Reactions  . Accutane [Isotretinoin] Other (See Comments)    Affected liver  . Ciprofloxacin Rash and Other (See Comments)    Kidney failure  . Erythromycin Diarrhea and Rash  . Gluten Diarrhea and Other (See Comments)    Celiac sprue  . Isotretinoin Other (See Comments)    Affects liver  . Lipitor [Atorvastatin] Other (See Comments)    Affects legs  . Lisinopril Other (See Comments)    Pt doesn't remember reaction   . Lovastatin Other (See Comments)    Affects legs  . Penicillins Other (See Comments)    From childhood  . Phenytoin Sodium Extended Other (See Comments)    Affects liver  . Pravastatin Other (See Comments)    Affected legs  . Crestor [Rosuvastatin]     Muscle aches even on 5mg  once weekly  . Gabapentin Other (See Comments)    Shaky, swollen legs  . Prolia [Denosumab] Other (See Comments)    Joints and muscle aches, loss of hair  . Boniva [Ibandronic Acid] Other (See Comments)    headaches  . Clarithromycin Rash  . Lescol [Fluvastatin Sodium] Other (See Comments)    Affects legs  . Sulfa Antibiotics Rash  . Welchol [Colesevelam Hcl] Other (See Comments)    No energy  . Zetia [Ezetimibe] Other (See Comments)    myalgias  . Zithromax [Azithromycin] Rash    There were no vitals taken for this visit.   Assessment/Plan: Hypertension: BP today is   Thank you, Lelan Pons. Patterson Hammersmith, [facility]  02/20/2017 7:15 AM

## 2017-02-20 NOTE — Patient Instructions (Signed)
It was nice to see you today. Your blood pressure was at goal of <150/90 mmHg. Continue current medications and check blood pressure at home. Please give [place] a call if anything is of concern.

## 2017-02-20 NOTE — Progress Notes (Signed)
lPatient ID: [name]                 DOB: [birth year]                      MRN: [id number]     HPI: [name] is a 83 y.o. female patient of Dr. Radford Pax who presents today to HTN clinic. PMH is significant for ASCAD s/p CABG (repeat cath showing 50% ostial SVG to D1, patent LIMA to LAD and patent LCx with nonobstructive disease of the RCA), HTN and dyslipidemia. On 7/24, pt's amlodipine was decreased to 5mg  and hydralazine was increased to 25mg  TID. On 8/21, pt called clinic with reports of unchanged elevated BP readings, fatigue, and loss of appetite that she attributed to hydralazine. She was advised to d/c hydralazine and medication list was updated to include updated dosing of clonidine and furosemide per her renal MD.  Today she presents assisted with 2 canes. She states she has been feeling well lately. Her swollen legs have improved. She is wearing her compression stockings. She purchased a new blood pressure cuff but was not using it due to having trouble turning it on. She was instructed on how to use her new cuff and it was calibrated in office today.  Since discontinuing her hydralazine, her lethargy, myalgias, and loss of appetite have subsided. She has been tolerating her amlodipine increase to 7.5 mg daily well and feels like her current regimen has been working well for her in regards to compliance.   Current HTN meds:  amlodipine 7.5 mg daily clonidine 0.1 mg twice daily (decreased by Dr Mercy Moore) furosemide 40 mg twice daily (increased by Dr Mercy Moore) irbesartan 300 mg daily  Previously tried: lisinopril - unknown, metoprolol - severe skin itching, worsening edema and lip swelling, hydralazine (myalgias, loss of appetite, fatigue)  BP goal: < 150/90 mmHg due to advanced age and gait unsteadiness  Family History: Diabetes in her mother, sister, and sister; Heart disease in her brother.  Social History: Denies tobacco and alcohol.   Diet: Most meals at home. Rarely adds  salt. 1-2 cups of decaf coffee per day.   Exercise: Exercises 3 days a week. Walking mostly.   Home BP readings:  142/85/62 (9/4) 150/86/63 (9/3) 142/84/66 (9/2)  Wt Readings from Last 3 Encounters:  01/07/17 192 lb 12.8 oz (87.5 kg)  07/09/16 189 lb (85.7 kg)  11/15/15 194 lb 6.4 oz (88.2 kg)   BP Readings from Last 3 Encounters:  01/15/17 (!) 168/64  01/07/17 (!) 158/64  07/19/16 (!) 164/58   Pulse Readings from Last 3 Encounters:  01/15/17 88  01/07/17 67  07/19/16 66    Renal function: CrCl cannot be calculated (Patient's most recent lab result is older than the maximum 21 days allowed.).  Past Medical History:  Diagnosis Date  . Carotid artery occlusion    1-39% bilateral carotid stenosis by dopplers 05/2016  . Celiac sprue   . Coronary artery disease    s/p CABG with 50% stenosis at ostium of the SVG to dirst diagonal, patent LIMA to LAD and patent left circ with nonobstructive ASCAD of the RCA  . Diabetes mellitus    diet controlled  . Diverticulitis   . Gait disorder   . GERD (gastroesophageal reflux disease)   . Hyperchloremia   . Hyperlipidemia    statin intolerant  . Hypertension   . Meningioma ([place])   . Obesity   . Rosacea  Current Outpatient Prescriptions on File Prior to Visit  Medication Sig Dispense Refill  . amLODipine (NORVASC) 5 MG tablet Take 1 tablet (5 mg total) by mouth daily. 90 tablet 3  . Ascorbic Acid (VITAMIN C PO) Take 1 tablet by mouth daily at 3 pm.    . aspirin EC 81 MG tablet Take 81 mg by mouth daily at 3 pm.    . Biotin 5000 MCG TABS Take 5,000 mcg by mouth once a week.     . Calcium Citrate (CITRACAL PO) Take 1 tablet by mouth daily at 3 pm.     . Cholecalciferol 4000 UNITS CAPS Take 4,000 Units by mouth daily at 3 pm.    . clindamycin (CLEOCIN) 150 MG capsule Take 150 mg by mouth as needed. Before dentist appointment    . cloNIDine (CATAPRES) 0.2 MG tablet Take 1 tablet (0.2 mg total) by mouth 2 (two) times daily.  (Patient taking differently: Take 0.1 mg by mouth 2 (two) times daily. ) 60 tablet 11  . furosemide (LASIX) 40 MG tablet Take 1 tablet (40 mg total) by mouth daily. Morning and mid evening (Patient taking differently: Take 40 mg by mouth 2 (two) times daily. Morning and mid evening) 30 tablet 11  . irbesartan (AVAPRO) 300 MG tablet Take 300 mg by mouth daily.    Marland Kitchen levothyroxine (SYNTHROID, LEVOTHROID) 25 MCG tablet Take 50 mg by mouth daily.     Marland Kitchen METRONIDAZOLE, TOPICAL, 0.75 % LOTN Apply 1 application topically at bedtime as needed (rosacea).    . Multiple Vitamin (MULITIVITAMIN WITH MINERALS) TABS Take 1 tablet by mouth daily at 3 pm.     . pantoprazole (PROTONIX) 40 MG tablet Take 40 mg by mouth daily before breakfast.    . potassium chloride (KLOR-CON) 8 MEQ tablet Take 8 mEq by mouth.  11  . pyridOXINE (VITAMIN B-6) 100 MG tablet Take 100 mg by mouth once a week. On Thursday    . vitamin B-12 (CYANOCOBALAMIN) 500 MCG tablet Take 500 mcg by mouth once a week. On Thursdays     No current facility-administered medications on file prior to visit.     Allergies  Allergen Reactions  . Accutane [Isotretinoin] Other (See Comments)    Affected liver  . Ciprofloxacin Rash and Other (See Comments)    Kidney failure  . Erythromycin Diarrhea and Rash  . Gluten Diarrhea and Other (See Comments)    Celiac sprue  . Isotretinoin Other (See Comments)    Affects liver  . Lipitor [Atorvastatin] Other (See Comments)    Affects legs  . Lisinopril Other (See Comments)    Pt doesn't remember reaction   . Lovastatin Other (See Comments)    Affects legs  . Penicillins Other (See Comments)    From childhood  . Phenytoin Sodium Extended Other (See Comments)    Affects liver  . Pravastatin Other (See Comments)    Affected legs  . Crestor [Rosuvastatin]     Muscle aches even on 5mg  once weekly  . Gabapentin Other (See Comments)    Shaky, swollen legs  . Prolia [Denosumab] Other (See Comments)     Joints and muscle aches, loss of hair  . Boniva [Ibandronic Acid] Other (See Comments)    headaches  . Clarithromycin Rash  . Lescol [Fluvastatin Sodium] Other (See Comments)    Affects legs  . Sulfa Antibiotics Rash  . Welchol [Colesevelam Hcl] Other (See Comments)    No energy  . Zetia [Ezetimibe] Other (See  Comments)    myalgias  . Zithromax [Azithromycin] Rash    Assessment/Plan:  1. Hypertension:  BP is 148/68 mmHg and at goal <150/60mmHg in office today. She has been compliant with the above medications and is feeling well with no reported dizziness, intolerance, or noncompliance. Her compression stockings have been controlling her edema, so advised to continue wearing them. Will continue current regimen.   Recommended to check her blood pressure at home daily with new blood pressure cuff and to keep record of readings. Advised to exercise if able, and to walk as much as she can tolerate. Advised to reduce salt intake and watch canned and prepared boxed foods. F/u in HTN clinic as needed.   Kadajah Kjos L. Kyung Rudd, PharmD, [place] PGY1 Pharmacy Resident [place]

## 2017-03-27 DIAGNOSIS — I739 Peripheral vascular disease, unspecified: Secondary | ICD-10-CM | POA: Diagnosis not present

## 2017-03-27 DIAGNOSIS — E1151 Type 2 diabetes mellitus with diabetic peripheral angiopathy without gangrene: Secondary | ICD-10-CM | POA: Diagnosis not present

## 2017-03-27 DIAGNOSIS — L603 Nail dystrophy: Secondary | ICD-10-CM | POA: Diagnosis not present

## 2017-03-27 DIAGNOSIS — L84 Corns and callosities: Secondary | ICD-10-CM | POA: Diagnosis not present

## 2017-04-01 DIAGNOSIS — Z23 Encounter for immunization: Secondary | ICD-10-CM | POA: Diagnosis not present

## 2017-04-08 ENCOUNTER — Ambulatory Visit (INDEPENDENT_AMBULATORY_CARE_PROVIDER_SITE_OTHER): Payer: Medicare Other | Admitting: Physician Assistant

## 2017-04-08 ENCOUNTER — Telehealth: Payer: Self-pay | Admitting: Physician Assistant

## 2017-04-08 ENCOUNTER — Encounter: Payer: Self-pay | Admitting: Physician Assistant

## 2017-04-08 VITALS — BP 142/50 | HR 86 | Ht 64.0 in | Wt 189.8 lb

## 2017-04-08 DIAGNOSIS — E7800 Pure hypercholesterolemia, unspecified: Secondary | ICD-10-CM

## 2017-04-08 DIAGNOSIS — I2510 Atherosclerotic heart disease of native coronary artery without angina pectoris: Secondary | ICD-10-CM

## 2017-04-08 DIAGNOSIS — I6523 Occlusion and stenosis of bilateral carotid arteries: Secondary | ICD-10-CM

## 2017-04-08 DIAGNOSIS — I10 Essential (primary) hypertension: Secondary | ICD-10-CM

## 2017-04-08 DIAGNOSIS — E118 Type 2 diabetes mellitus with unspecified complications: Secondary | ICD-10-CM

## 2017-04-08 NOTE — Progress Notes (Signed)
Cardiology Office Note    Date:  04/08/2017   ID:  [name], DOB 1928-07-04, MRN [id number]  PCP:  Hulan Fess, MD  Cardiologist: Dr. Radford Pax  Chief Complaint  Patient presents with  . Follow-up    History of Present Illness:  [name] is a 83 y.o. female with ASCAD s/p CABG 2002 (repeat cath showing 50% ostial SVG to D1, patent LIMA to LAD and patent LCx with nonobstructive disease of the RCA), HTN and dyslipidemia, DM type II, chronic lower extremity edema.Last seen by Tana Coast,, RPH For hypertension. Was feeling better off hydralazine and on increased amlodipine. Left pressure goal is less than 150/90 due to age and gait mobility. Mild carotid stenosis 1-39% 05/2016.  Patient comes in a list of BP readings and all are stable with most of them under 150/90 goal. She has had some leg edema since amlodipine increased but pretty well controlled with compression stockings. She has no cardiac complaints today. Denies chest pain, or shortness of breath.     Past Medical History:  Diagnosis Date  . Carotid artery occlusion    1-39% bilateral carotid stenosis by dopplers 05/2016  . Celiac sprue   . Coronary artery disease    s/p CABG with 50% stenosis at ostium of the SVG to dirst diagonal, patent LIMA to LAD and patent left circ with nonobstructive ASCAD of the RCA  . Diabetes mellitus    diet controlled  . Diverticulitis   . Gait disorder   . GERD (gastroesophageal reflux disease)   . Hyperchloremia   . Hyperlipidemia    statin intolerant  . Hypertension   . Meningioma ([place])   . Obesity   . Rosacea     Past Surgical History:  Procedure Laterality Date  . ABDOMINAL HYSTERECTOMY    . BRAIN TUMOR EXCISION  1993   meningioma, left frontal  . BUNIONECTOMY    . CARDIAC CATHETERIZATION    . CORONARY ARTERY BYPASS GRAFT  2002    3 vessel  . JOINT REPLACEMENT  2009  . REPLACEMENT TOTAL KNEE    . ROTATOR CUFF REPAIR  2010   Dr. Gladstone Lighter  . TONSILLECTOMY       Current Medications: Current Meds  Medication Sig  . amLODipine (NORVASC) 5 MG tablet Take 1.5 tablets (7.5 mg total) by mouth daily.  . Ascorbic Acid (VITAMIN C PO) Take 1 tablet by mouth daily at 3 pm.  . aspirin EC 81 MG tablet Take 81 mg by mouth daily at 3 pm.  . Biotin 5000 MCG TABS Take 5,000 mcg by mouth once a week.   . Calcium Citrate (CITRACAL PO) Take 1 tablet by mouth daily at 3 pm.   . Cholecalciferol 4000 UNITS CAPS Take 4,000 Units by mouth daily at 3 pm.  . clindamycin (CLEOCIN) 150 MG capsule Take 150 mg by mouth 4 (four) times daily as needed (dentist appointment).   . cloNIDine (CATAPRES) 0.2 MG tablet Take 1 tablet (0.2 mg total) by mouth 2 (two) times daily.  . furosemide (LASIX) 40 MG tablet Take 1 tablet (40 mg total) by mouth daily. Morning and mid evening  . irbesartan (AVAPRO) 300 MG tablet Take 300 mg by mouth daily.  . Levothyroxine Sodium 50 MCG CAPS Take 50 capsules by mouth daily.  Marland Kitchen METRONIDAZOLE, TOPICAL, 0.75 % LOTN Apply 1 application topically at bedtime as needed (rosacea).  . Multiple Vitamin (MULITIVITAMIN WITH MINERALS) TABS Take 1 tablet by mouth daily at 3 pm.   .  pantoprazole (PROTONIX) 40 MG tablet Take 40 mg by mouth daily before breakfast.  . potassium chloride (KLOR-CON) 8 MEQ tablet Take 8 mEq by mouth.  . pyridOXINE (VITAMIN B-6) 100 MG tablet Take 100 mg by mouth once a week. On Thursday  . vitamin B-12 (CYANOCOBALAMIN) 500 MCG tablet Take 500 mcg by mouth once a week. On Thursdays     Allergies:   Accutane [isotretinoin]; Ciprofloxacin; Erythromycin; Gluten; Isotretinoin; Lipitor [atorvastatin]; Lisinopril; Lovastatin; Penicillins; Phenytoin sodium extended; Pravastatin; Crestor [rosuvastatin]; Gabapentin; Prolia [denosumab]; Boniva [ibandronic acid]; Clarithromycin; Lescol [fluvastatin sodium]; Sulfa antibiotics; Welchol [colesevelam hcl]; Zetia [ezetimibe]; and Zithromax [azithromycin]   Social History   Social History  . Marital  status: Widowed    Spouse name: N/A  . Number of children: 4  . Years of education: 6   Occupational History  . Retired Retired   Social History Main Topics  . Smoking status: Never Smoker  . Smokeless tobacco: Never Used  . Alcohol use No  . Drug use: No  . Sexual activity: Not Asked   Other Topics Concern  . None   Social History Narrative  . None     Family History:  The patient's family history includes Diabetes in her mother, sister, and sister; Heart disease in her brother.   ROS:   Please see the history of present illness.    Review of Systems  Constitution: Negative.  HENT: Negative.   Eyes: Negative.   Cardiovascular: Positive for leg swelling.  Respiratory: Negative.   Hematologic/Lymphatic: Negative.   Musculoskeletal: Negative.  Negative for joint pain.  Gastrointestinal: Negative.   Genitourinary: Negative.   Neurological: Negative.    All other systems reviewed and are negative.   PHYSICAL EXAM:   VS:  BP (!) 142/50   Pulse 86   Ht 5\' 4"  (1.626 m)   Wt 189 lb 12.8 oz (86.1 kg)   SpO2 96%   BMI 32.58 kg/m   Physical Exam  GEN: Obese, in no acute distress  Neck: no JVD, carotid bruits, or masses Cardiac:RRR; no murmurs, rubs, or gallops  Respiratory:  clear to auscultation bilaterally, normal work of breathing GI: soft, nontender, nondistended, + BS Ext: without cyanosis, clubbing, or edema, Good distal pulses bilaterally Neuro:  Alert and Oriented x 3, Psych: euthymic mood, full affect  Wt Readings from Last 3 Encounters:  04/08/17 189 lb 12.8 oz (86.1 kg)  01/07/17 192 lb 12.8 oz (87.5 kg)  07/09/16 189 lb (85.7 kg)      Studies/Labs Reviewed:   EKG:  EKG is not ordered today.    Recent Labs: 01/15/2017: BUN 11; Creatinine, Ser 1.61; NT-Pro BNP 3,188; Potassium 4.2; Sodium 139   Lipid Panel    Component Value Date/Time   CHOL 188 10/24/2015 0926   TRIG 169 (H) 10/24/2015 0926   HDL 57 10/24/2015 0926   CHOLHDL 3.3 10/24/2015  0926   VLDL 34 (H) 10/24/2015 0926   LDLCALC 97 10/24/2015 0926   LDLDIRECT 133.9 03/24/2014 0824    Additional studies/ records that were reviewed today include:  2-D echo 2014Study Conclusions  - Left ventricle: The cavity size was normal. There was mild   focal basal hypertrophy of the septum. Systolic function   was normal. The estimated ejection fraction was in the   range of 60% to 65%. Longley motion was normal; there were no   regional Uliano motion abnormalities. There was an increased   relative contribution of atrial contraction to ventricular   filling. Features  are consistent with a pseudonormal left   ventricular filling pattern, with concomitant abnormal   relaxation and increased filling pressure (grade 2   diastolic dysfunction). - Aortic valve: Trivial regurgitation. - Mitral valve: Mild regurgitation. - Left atrium: The atrium was mildly dilated.     ASSESSMENT:    1. Coronary artery disease involving native coronary artery of native heart without angina pectoris   2. Essential hypertension   3. Bilateral carotid artery stenosis   4. Type 2 diabetes mellitus with complication, unspecified whether long term insulin use ([place])   5. Pure hypercholesterolemia      PLAN:  In order of problems listed above:  CAD status post CABG in 2002Stable without angina  Essential hypertension blood pressure is good today and readings from home are all stable. She does have some increased leg edema on the increase Norvasc but she is not wearing her compression stockings today. She does wear them every day she says. If she is bothered by the swelling or worsens we can always go back to Norvasc 5 mg daily and increase the clonidine. She will call us. Follow-up with Dr. Radford Pax for 5 months.  Bilateral carotid artery stenosis mild in Dopplers in 05/2016. Will need repeat in 05/2018.  Type 2 diabetes mellitus managed by primary care  Pure hypercholesterolemia statin intolerant and  unable to afford PCSK9inhibitor last LDL 133. No changes today.    Medication Adjustments/Labs and Tests Ordered: Current medicines are reviewed at length with the patient today.  Concerns regarding medicines are outlined above.  Medication changes, Labs and Tests ordered today are listed in the Patient Instructions below. There are no Patient Instructions on file for this visit.   Sumner Boast, PA-C  04/08/2017 10:42 AM    [facility] [address], [place], [place]  [address] Phone: [phone]; Fax: [phone]

## 2017-04-08 NOTE — Patient Instructions (Addendum)
Medication Instructions:  Your physician recommends that you continue on your current medications as directed. Please refer to the Current Medication list given to you today.   Labwork: NONE ORDERED TODAY  Testing/Procedures: NONE ORDERED TODAY  Follow-Up: DR. Radford Pax 4-5 MONTHS ; WE WILL SEND OUT A REMINDER LETTER TO HAVE YOU CALL AND MAKE AN APPT  Any Other Special Instructions Will Be Listed Below (If Applicable).     If you need a refill on your cardiac medications before your next appointment, please call your pharmacy.

## 2017-04-08 NOTE — Telephone Encounter (Signed)
New message    Pt is calling to let Arbie Cookey know the clonidine was right-.01 mg tablet.

## 2017-04-25 DIAGNOSIS — E118 Type 2 diabetes mellitus with unspecified complications: Secondary | ICD-10-CM | POA: Diagnosis not present

## 2017-04-25 DIAGNOSIS — I10 Essential (primary) hypertension: Secondary | ICD-10-CM | POA: Diagnosis not present

## 2017-04-25 DIAGNOSIS — R778 Other specified abnormalities of plasma proteins: Secondary | ICD-10-CM | POA: Diagnosis not present

## 2017-04-25 DIAGNOSIS — E559 Vitamin D deficiency, unspecified: Secondary | ICD-10-CM | POA: Diagnosis not present

## 2017-05-01 DIAGNOSIS — I779 Disorder of arteries and arterioles, unspecified: Secondary | ICD-10-CM | POA: Diagnosis not present

## 2017-05-01 DIAGNOSIS — K219 Gastro-esophageal reflux disease without esophagitis: Secondary | ICD-10-CM | POA: Diagnosis not present

## 2017-05-01 DIAGNOSIS — Z6834 Body mass index (BMI) 34.0-34.9, adult: Secondary | ICD-10-CM | POA: Diagnosis not present

## 2017-05-01 DIAGNOSIS — E1122 Type 2 diabetes mellitus with diabetic chronic kidney disease: Secondary | ICD-10-CM | POA: Diagnosis not present

## 2017-05-01 DIAGNOSIS — E039 Hypothyroidism, unspecified: Secondary | ICD-10-CM | POA: Diagnosis not present

## 2017-05-01 DIAGNOSIS — I10 Essential (primary) hypertension: Secondary | ICD-10-CM | POA: Diagnosis not present

## 2017-05-01 DIAGNOSIS — E7800 Pure hypercholesterolemia, unspecified: Secondary | ICD-10-CM | POA: Diagnosis not present

## 2017-05-01 DIAGNOSIS — M810 Age-related osteoporosis without current pathological fracture: Secondary | ICD-10-CM | POA: Diagnosis not present

## 2017-05-01 DIAGNOSIS — E118 Type 2 diabetes mellitus with unspecified complications: Secondary | ICD-10-CM | POA: Diagnosis not present

## 2017-05-01 DIAGNOSIS — I2510 Atherosclerotic heart disease of native coronary artery without angina pectoris: Secondary | ICD-10-CM | POA: Diagnosis not present

## 2017-05-01 DIAGNOSIS — Z0000 Encounter for general adult medical examination without abnormal findings: Secondary | ICD-10-CM | POA: Diagnosis not present

## 2017-05-01 DIAGNOSIS — E559 Vitamin D deficiency, unspecified: Secondary | ICD-10-CM | POA: Diagnosis not present

## 2017-05-14 DIAGNOSIS — K219 Gastro-esophageal reflux disease without esophagitis: Secondary | ICD-10-CM | POA: Diagnosis not present

## 2017-06-01 ENCOUNTER — Telehealth: Payer: Self-pay | Admitting: Oncology

## 2017-06-01 NOTE — Telephone Encounter (Signed)
sw pt to confirm new pt appt 06/25/17 T 11 AM

## 2017-06-06 DIAGNOSIS — Z6833 Body mass index (BMI) 33.0-33.9, adult: Secondary | ICD-10-CM | POA: Diagnosis not present

## 2017-06-06 DIAGNOSIS — E669 Obesity, unspecified: Secondary | ICD-10-CM | POA: Diagnosis not present

## 2017-06-06 DIAGNOSIS — J0100 Acute maxillary sinusitis, unspecified: Secondary | ICD-10-CM | POA: Diagnosis not present

## 2017-06-16 DIAGNOSIS — M109 Gout, unspecified: Secondary | ICD-10-CM | POA: Diagnosis not present

## 2017-06-16 DIAGNOSIS — J329 Chronic sinusitis, unspecified: Secondary | ICD-10-CM | POA: Diagnosis not present

## 2017-06-16 DIAGNOSIS — J069 Acute upper respiratory infection, unspecified: Secondary | ICD-10-CM | POA: Diagnosis not present

## 2017-06-19 ENCOUNTER — Telehealth: Payer: Self-pay | Admitting: Oncology

## 2017-06-19 NOTE — Telephone Encounter (Signed)
Called and spoke with patient regarding rescheduling her appointment from 1/8/ per Dr Alen Blew.

## 2017-06-21 DIAGNOSIS — Z8739 Personal history of other diseases of the musculoskeletal system and connective tissue: Secondary | ICD-10-CM | POA: Diagnosis not present

## 2017-06-25 ENCOUNTER — Encounter: Payer: Medicare Other | Admitting: Oncology

## 2017-06-25 ENCOUNTER — Ambulatory Visit (HOSPITAL_COMMUNITY)
Admission: RE | Admit: 2017-06-25 | Discharge: 2017-06-25 | Disposition: A | Discharge status: Home / Self Care | Payer: Medicare Other | Source: Ambulatory Visit | Attending: Cardiovascular Disease | Admitting: Cardiovascular Disease

## 2017-06-25 DIAGNOSIS — I6523 Occlusion and stenosis of bilateral carotid arteries: Secondary | ICD-10-CM | POA: Diagnosis not present

## 2017-06-28 DIAGNOSIS — L84 Corns and callosities: Secondary | ICD-10-CM | POA: Diagnosis not present

## 2017-06-28 DIAGNOSIS — E1151 Type 2 diabetes mellitus with diabetic peripheral angiopathy without gangrene: Secondary | ICD-10-CM | POA: Diagnosis not present

## 2017-06-28 DIAGNOSIS — I739 Peripheral vascular disease, unspecified: Secondary | ICD-10-CM | POA: Diagnosis not present

## 2017-06-28 DIAGNOSIS — L603 Nail dystrophy: Secondary | ICD-10-CM | POA: Diagnosis not present

## 2017-07-04 ENCOUNTER — Inpatient Hospital Stay: Payer: Medicare Other | Attending: Oncology | Admitting: Oncology

## 2017-07-04 ENCOUNTER — Telehealth: Payer: Self-pay | Admitting: Oncology

## 2017-07-04 VITALS — BP 186/70 | HR 107 | Temp 97.9°F | Resp 24 | Ht 64.0 in | Wt 180.5 lb

## 2017-07-04 DIAGNOSIS — D472 Monoclonal gammopathy: Secondary | ICD-10-CM | POA: Insufficient documentation

## 2017-07-04 NOTE — Telephone Encounter (Signed)
Gave avs and calendar for july °

## 2017-07-04 NOTE — Progress Notes (Signed)
Reason for Referral: Evaluation for an abnormal serum protein electrophoresis.  HPI: 84 year old woman currently of [place] where she lived the majority of her life.  She has a history of coronary artery disease, diabetes and hypertension.  Despite these comorbid conditions, she continues to live independently and attends to activities of daily living.  She is able to ambulate with the help of a cane without any falls or syncope.  He was found to have an elevated protein and had fluctuated close to 8.6 for the last 3 years.  Serum protein electrophoresis obtained by her primary care physician showed an elevated globulins but no monoclonal protein detected.  She had normal hemoglobin but slightly elevated creatinine.  She denied any recent bone fractures or dislocations.  She denies any bone pain or pathological fractures.  She denies any peripheral neuropathy or opportunistic infections.  He denies any headaches, blurry vision, syncope or seizures.  She does not report any fevers, chills or sweats.  She does not report any chest pain, palpitation orthopnea.  She does not report any cough, wheezing or hemoptysis.  She does not report any nausea, vomiting or abdominal pain.  She does not report any frequency urgency or hesitancy.  She does not report any arthralgias or myalgias.  Remaining review of systems is negative.  Past Medical History:  Diagnosis Date  . Carotid artery occlusion    1-39% bilateral carotid stenosis by dopplers 05/2016  . Celiac sprue   . Coronary artery disease    s/p CABG with 50% stenosis at ostium of the SVG to dirst diagonal, patent LIMA to LAD and patent left circ with nonobstructive ASCAD of the RCA  . Diabetes mellitus    diet controlled  . Diverticulitis   . Gait disorder   . GERD (gastroesophageal reflux disease)   . Hyperchloremia   . Hyperlipidemia    statin intolerant  . Hypertension   . Meningioma ([place])   . Obesity   . Rosacea   :  Past Surgical  History:  Procedure Laterality Date  . ABDOMINAL HYSTERECTOMY    . BRAIN TUMOR EXCISION  1993   meningioma, left frontal  . BUNIONECTOMY    . CARDIAC CATHETERIZATION    . CORONARY ARTERY BYPASS GRAFT  2002    3 vessel  . JOINT REPLACEMENT  2009  . REPLACEMENT TOTAL KNEE    . ROTATOR CUFF REPAIR  2010   Dr. Gladstone Lighter  . TONSILLECTOMY    :   Current Outpatient Medications:  .  amLODipine (NORVASC) 5 MG tablet, Take 1.5 tablets (7.5 mg total) by mouth daily., Disp: 135 tablet, Rfl: 3 .  Ascorbic Acid (VITAMIN C PO), Take 1 tablet by mouth daily at 3 pm., Disp: , Rfl:  .  aspirin EC 81 MG tablet, Take 81 mg by mouth daily at 3 pm., Disp: , Rfl:  .  Biotin 5000 MCG TABS, Take 5,000 mcg by mouth once a week. , Disp: , Rfl:  .  Calcium Citrate (CITRACAL PO), Take 1 tablet by mouth daily at 3 pm. , Disp: , Rfl:  .  Cholecalciferol 4000 UNITS CAPS, Take 4,000 Units by mouth daily at 3 pm., Disp: , Rfl:  .  clindamycin (CLEOCIN) 150 MG capsule, Take 150 mg by mouth 4 (four) times daily as needed (dentist appointment). , Disp: , Rfl:  .  cloNIDine (CATAPRES) 0.1 MG tablet, Take 0.1 mg by mouth 2 (two) times daily., Disp: , Rfl:  .  furosemide (LASIX) 40 MG tablet,  Take 1 tablet (40 mg total) by mouth daily. Morning and mid evening, Disp: 30 tablet, Rfl: 11 .  irbesartan (AVAPRO) 300 MG tablet, Take 300 mg by mouth daily., Disp: , Rfl:  .  Levothyroxine Sodium 50 MCG CAPS, Take 50 capsules by mouth daily., Disp: , Rfl:  .  METRONIDAZOLE, TOPICAL, 0.75 % LOTN, Apply 1 application topically at bedtime as needed (rosacea)., Disp: , Rfl:  .  Multiple Vitamin (MULITIVITAMIN WITH MINERALS) TABS, Take 1 tablet by mouth daily at 3 pm. , Disp: , Rfl:  .  pantoprazole (PROTONIX) 40 MG tablet, Take 40 mg by mouth daily before breakfast., Disp: , Rfl:  .  potassium chloride (KLOR-CON) 8 MEQ tablet, Take 8 mEq by mouth., Disp: , Rfl: 11 .  pyridOXINE (VITAMIN B-6) 100 MG tablet, Take 100 mg by mouth once a  week. On Thursday, Disp: , Rfl:  .  vitamin B-12 (CYANOCOBALAMIN) 500 MCG tablet, Take 500 mcg by mouth once a week. On Thursdays, Disp: , Rfl: :  Allergies  Allergen Reactions  . Accutane [Isotretinoin] Other (See Comments)    Affected liver  . Ciprofloxacin Rash and Other (See Comments)    Kidney failure  . Erythromycin Diarrhea and Rash  . Gluten Diarrhea and Other (See Comments)    Celiac sprue  . Isotretinoin Other (See Comments)    Affects liver  . Lipitor [Atorvastatin] Other (See Comments)    Affects legs  . Lisinopril Other (See Comments)    Pt doesn't remember reaction   . Lovastatin Other (See Comments)    Affects legs  . Penicillins Other (See Comments)    From childhood  . Phenytoin Sodium Extended Other (See Comments)    Affects liver  . Pravastatin Other (See Comments)    Affected legs  . Crestor [Rosuvastatin]     Muscle aches even on '5mg'$  once weekly  . Gabapentin Other (See Comments)    Shaky, swollen legs  . Prolia [Denosumab] Other (See Comments)    Joints and muscle aches, loss of hair  . Boniva [Ibandronic Acid] Other (See Comments)    headaches  . Clarithromycin Rash  . Lescol [Fluvastatin Sodium] Other (See Comments)    Affects legs  . Sulfa Antibiotics Rash  . Welchol [Colesevelam Hcl] Other (See Comments)    No energy  . Zetia [Ezetimibe] Other (See Comments)    myalgias  . Zithromax [Azithromycin] Rash  :  Family History  Problem Relation Age of Onset  . Diabetes Mother   . Heart disease Brother   . Diabetes Sister   . Diabetes Sister   :  Social History   Socioeconomic History  . Marital status: Widowed    Spouse name: Not on file  . Number of children: 4  . Years of education: 25  . Highest education level: Not on file  Social Needs  . Financial resource strain: Not on file  . Food insecurity - worry: Not on file  . Food insecurity - inability: Not on file  . Transportation needs - medical: Not on file  . Transportation  needs - non-medical: Not on file  Occupational History  . Occupation: Retired    Fish farm manager: RETIRED  Tobacco Use  . Smoking status: Never Smoker  . Smokeless tobacco: Never Used  Substance and Sexual Activity  . Alcohol use: No  . Drug use: No  . Sexual activity: Not on file  Other Topics Concern  . Not on file  Social History Narrative  .  Not on file  :  Pertinent items are noted in HPI.  Exam: Blood pressure (!) 186/70, pulse (!) 107, temperature 97.9 F (36.6 C), temperature source Oral, resp. rate (!) 24, height '5\' 4"'$  (1.626 m), weight 180 lb 8 oz (81.9 kg), SpO2 98 %. General appearance: alert, cooperative appeared without distress. Eyes: conjunctivae/corneas clear. PERRL,  Throat: lips, mucosa, and tongue normal; teeth and gums normal Resp: clear to auscultation bilaterally rhonchi or wheezes. Cardio: regular rate and rhythm, S1, S2 normal, no murmur, click, rub or gallop GI: soft, non-tender; bowel sounds normal; no masses,  no organomegaly Extremities: extremities normal, atraumatic, no cyanosis or edema Skin: Skin color, texture, turgor normal. No rashes or lesions Lymph nodes: Cervical, supraclavicular, and axillary nodes normal.  CBC    Component Value Date/Time   WBC 13.4 (H) 06/20/2013 2155   RBC 4.58 06/20/2013 2155   HGB 11.3 (L) 06/20/2013 2155   HCT 35.1 (L) 06/20/2013 2155   PLT 265 06/20/2013 2155   MCV 76.6 (L) 06/20/2013 2155   MCH 24.7 (L) 06/20/2013 2155   MCHC 32.2 06/20/2013 2155   RDW 15.7 (H) 06/20/2013 2155   LYMPHSABS 4.7 (H) 06/20/2013 2155   MONOABS 0.9 06/20/2013 2155   EOSABS 0.1 06/20/2013 2155   BASOSABS 0.0 06/20/2013 2155     Assessment and Plan:   84 year old woman with the following issues:  1.  Elevated total protein: This is a finding that has been detected at least since 2015 at that time her protein was 8.5.  Her total protein has not changed much over the last 4 years.  Serum protein electrophoresis showed an elevated  gamma globulins although no detected M spike.  The differential diagnosis was discussed today with the patient.  These findings suggest a reactive polyclonal gammopathy rather than a plasma cell disorder.  These findings can be found in the setting of liver disease, autoimmune arthritis or probably medications.  Multiple myeloma, amyloidosis and MGUS are considered unlikely.  For completeness sake, I will repeat her serum protein electrophoresis and quantitative immunoglobulins in 6 months to ensure stability.  If these findings continue to suggest poly-gammopathy then no further intervention is needed.  2.  Follow-up: We will be in 6 months after repeating laboratory testing.  30 minutes was spent today face-to-face with the patient today.  More than 50% of the time was dedicated to education, counseling and coordinating  her care.

## 2017-07-12 DIAGNOSIS — L718 Other rosacea: Secondary | ICD-10-CM | POA: Diagnosis not present

## 2017-07-12 DIAGNOSIS — L814 Other melanin hyperpigmentation: Secondary | ICD-10-CM | POA: Diagnosis not present

## 2017-07-12 DIAGNOSIS — Z85828 Personal history of other malignant neoplasm of skin: Secondary | ICD-10-CM | POA: Diagnosis not present

## 2017-07-12 DIAGNOSIS — L720 Epidermal cyst: Secondary | ICD-10-CM | POA: Diagnosis not present

## 2017-07-12 DIAGNOSIS — L570 Actinic keratosis: Secondary | ICD-10-CM | POA: Diagnosis not present

## 2017-07-12 DIAGNOSIS — L853 Xerosis cutis: Secondary | ICD-10-CM | POA: Diagnosis not present

## 2017-07-12 DIAGNOSIS — D692 Other nonthrombocytopenic purpura: Secondary | ICD-10-CM | POA: Diagnosis not present

## 2017-07-12 DIAGNOSIS — L821 Other seborrheic keratosis: Secondary | ICD-10-CM | POA: Diagnosis not present

## 2017-07-12 DIAGNOSIS — L820 Inflamed seborrheic keratosis: Secondary | ICD-10-CM | POA: Diagnosis not present

## 2017-07-24 DIAGNOSIS — Z6833 Body mass index (BMI) 33.0-33.9, adult: Secondary | ICD-10-CM | POA: Diagnosis not present

## 2017-07-24 DIAGNOSIS — J3489 Other specified disorders of nose and nasal sinuses: Secondary | ICD-10-CM | POA: Diagnosis not present

## 2017-07-24 DIAGNOSIS — E669 Obesity, unspecified: Secondary | ICD-10-CM | POA: Diagnosis not present

## 2017-08-06 ENCOUNTER — Other Ambulatory Visit: Payer: Self-pay | Admitting: Family Medicine

## 2017-08-06 DIAGNOSIS — Z1231 Encounter for screening mammogram for malignant neoplasm of breast: Secondary | ICD-10-CM

## 2017-08-08 DIAGNOSIS — J310 Chronic rhinitis: Secondary | ICD-10-CM | POA: Diagnosis not present

## 2017-08-18 NOTE — Progress Notes (Signed)
Cardiology Office Note:    Date:  08/20/2017   ID:  [name], DOB 1929/04/07, MRN [id number]  PCP:  Hulan Fess, MD  Cardiologist:  No primary care provider on file.    Referring MD: Hulan Fess, MD   Chief Complaint  Patient presents with  . Coronary Artery Disease  . Hypertension  . Hyperlipidemia    History of Present Illness:    [name] is a 84 y.o. female with a hx of ASCAD s/p CABG (repeat cath showing 50% ostial SVG to D1, patent LIMA to LAD and patent LCx with nonobstructive disease of the RCA), HTN and dyslipidemia.  She also has mild carotid artery stenosis 1-39% by dopplers 05/2016.   She is here today for followup and is doing well.  She denies any chest pain or pressure, SOB, DOE, PND, orthopnea,  dizziness, palpitations or syncope. She has chronic LE edema which she thinks has gotten worse since starting on the BB.  She uses compression hose at home.  She is compliant with her meds and is tolerating meds with no SE.      Past Medical History:  Diagnosis Date  . Carotid artery occlusion    1-39% bilateral carotid stenosis by dopplers 05/2016  . Celiac sprue   . Coronary artery disease    s/p CABG with 50% stenosis at ostium of the SVG to dirst diagonal, patent LIMA to LAD and patent left circ with nonobstructive ASCAD of the RCA  . Diabetes mellitus    diet controlled  . Diverticulitis   . Gait disorder   . GERD (gastroesophageal reflux disease)   . Hyperchloremia   . Hyperlipidemia    statin intolerant  . Hypertension   . Meningioma ([place])   . Obesity   . Rosacea     Past Surgical History:  Procedure Laterality Date  . ABDOMINAL HYSTERECTOMY    . BRAIN TUMOR EXCISION  1993   meningioma, left frontal  . BUNIONECTOMY    . CARDIAC CATHETERIZATION    . CORONARY ARTERY BYPASS GRAFT  2002    3 vessel  . JOINT REPLACEMENT  2009  . REPLACEMENT TOTAL KNEE    . ROTATOR CUFF REPAIR  2010   Dr. Gladstone Lighter  . TONSILLECTOMY      Current  Medications: Current Meds  Medication Sig  . amLODipine (NORVASC) 5 MG tablet Take 1.5 tablets (7.5 mg total) by mouth daily.  . Ascorbic Acid (VITAMIN C PO) Take 1 tablet by mouth daily at 3 pm.  . aspirin EC 81 MG tablet Take 81 mg by mouth daily at 3 pm.  . Biotin 5000 MCG TABS Take 5,000 mcg by mouth once a week.   . Calcium Citrate (CITRACAL PO) Take 1 tablet by mouth daily at 3 pm.   . Cholecalciferol 4000 UNITS CAPS Take 4,000 Units by mouth daily at 3 pm.  . clindamycin (CLEOCIN) 150 MG capsule Take 150 mg by mouth 4 (four) times daily as needed (dentist appointment).   . cloNIDine (CATAPRES) 0.1 MG tablet Take 0.1 mg by mouth 2 (two) times daily.  . furosemide (LASIX) 40 MG tablet Take 1 tablet (40 mg total) by mouth daily. Morning and mid evening  . irbesartan (AVAPRO) 300 MG tablet Take 300 mg by mouth daily.  . Levothyroxine Sodium 50 MCG CAPS Take 1 capsule by mouth daily.   Marland Kitchen METRONIDAZOLE, TOPICAL, 0.75 % LOTN Apply 1 application topically at bedtime as needed (rosacea).  . Multiple Vitamin (MULITIVITAMIN  WITH MINERALS) TABS Take 1 tablet by mouth daily at 3 pm.   . pantoprazole (PROTONIX) 40 MG tablet Take 40 mg by mouth daily before breakfast.  . potassium chloride (KLOR-CON) 8 MEQ tablet Take 8 mEq by mouth daily.   Marland Kitchen pyridOXINE (VITAMIN B-6) 100 MG tablet Take 100 mg by mouth once a week. On Thursday  . vitamin B-12 (CYANOCOBALAMIN) 500 MCG tablet Take 500 mcg by mouth once a week. On Thursdays     Allergies:   Accutane [isotretinoin]; Ciprofloxacin; Erythromycin; Gluten; Gluten meal; Isotretinoin; Lipitor [atorvastatin]; Lisinopril; Lovastatin; Penicillins; Phenytoin sodium extended; Pravastatin; Crestor [rosuvastatin]; Gabapentin; Metoprolol; Prolia [denosumab]; Boniva [ibandronic acid]; Clarithromycin; Hydralazine; Lescol [fluvastatin sodium]; Sulfa antibiotics; Sulfasalazine; Welchol [colesevelam hcl]; Zetia [ezetimibe]; and Zithromax [azithromycin]   Social History    Socioeconomic History  . Marital status: Widowed    Spouse name: None  . Number of children: 4  . Years of education: 18  . Highest education level: None  Social Needs  . Financial resource strain: None  . Food insecurity - worry: None  . Food insecurity - inability: None  . Transportation needs - medical: None  . Transportation needs - non-medical: None  Occupational History  . Occupation: Retired    Fish farm manager: RETIRED  Tobacco Use  . Smoking status: Never Smoker  . Smokeless tobacco: Never Used  Substance and Sexual Activity  . Alcohol use: No  . Drug use: No  . Sexual activity: None  Other Topics Concern  . None  Social History Narrative  . None     Family History: The patient's family history includes Diabetes in her mother, sister, and sister; Heart disease in her brother.  ROS:   Please see the history of present illness.    ROS  All other systems reviewed and negative.   EKGs/Labs/Other Studies Reviewed:    The following studies were reviewed today: none  EKG:  EKG is not ordered today.  Recent Labs: 01/15/2017: BUN 11; Creatinine, Ser 1.61; NT-Pro BNP 3,188; Potassium 4.2; Sodium 139   Recent Lipid Panel    Component Value Date/Time   CHOL 188 10/24/2015 0926   TRIG 169 (H) 10/24/2015 0926   HDL 57 10/24/2015 0926   CHOLHDL 3.3 10/24/2015 0926   VLDL 34 (H) 10/24/2015 0926   LDLCALC 97 10/24/2015 0926   LDLDIRECT 133.9 03/24/2014 0824    Physical Exam:    VS:  BP (!) 174/70   Pulse 74   Ht 5\' 4"  (1.626 m)   Wt 172 lb 6.4 oz (78.2 kg)   SpO2 97%   BMI 29.59 kg/m     Wt Readings from Last 3 Encounters:  08/20/17 172 lb 6.4 oz (78.2 kg)  07/04/17 180 lb 8 oz (81.9 kg)  04/08/17 189 lb 12.8 oz (86.1 kg)     GEN:  Well nourished, well developed in no acute distress HEENT: Normal NECK: No JVD; left carotid artery bruit LYMPHATICS: No lymphadenopathy CARDIAC: RRR, no murmurs, rubs, gallops RESPIRATORY:  Clear to auscultation without  rales, wheezing or rhonchi  ABDOMEN: Soft, non-tender, non-distended MUSCULOSKELETAL:  No edema; No deformity  SKIN: Warm and dry NEUROLOGIC:  Alert and oriented x 3 PSYCHIATRIC:  Normal affect   ASSESSMENT:    1. Coronary artery disease involving native coronary artery of native heart without angina pectoris   2. Essential hypertension   3. Bilateral carotid artery stenosis   4. Edema extremities   5. Pure hypercholesterolemia    PLAN:    In order  of problems listed above:  1.  ASCAD s/p CABG (repeat cath showing 50% ostial SVG to D1, patent LIMA to LAD and patent LCx with nonobstructive disease of the RCA).  She has not had any anginal symptoms.  She will continue on aspirin 81 mg daily.   She is statin intolerant.  2.  Hypertension - her blood pressure is poorly controlled on exam today.  Her BP elevated at home as well.  She will continue on Clonidine 0.1mg  BID and  irbesartan 300 mg daily and increase amlodipine to 10mg  daily.  She will continue to check her BP at home and bring those readings to HTN clinic in 2 weeks.    3.  Bilateral carotid artery stenosis - Dopplers were done 06/2017 showing 1-39% bilateral carotid stenosis.  She will continue on aspirin.  4.  Chronic LE edema - this is controlled on diuretics and compression hose.  Creatinine was 1.45 in 05/2017  5.  Hyperlipidemia with LDL goal less than 70.  She is statin intolerant.   PCSK9i is cost prohibitive.   Medication Adjustments/Labs and Tests Ordered: Current medicines are reviewed at length with the patient today.  Concerns regarding medicines are outlined above.  No orders of the defined types were placed in this encounter.  No orders of the defined types were placed in this encounter.   Signed, Fransico Him, MD  08/20/2017 8:01 AM    [place]

## 2017-08-20 ENCOUNTER — Encounter: Payer: Self-pay | Admitting: Cardiology

## 2017-08-20 ENCOUNTER — Ambulatory Visit (INDEPENDENT_AMBULATORY_CARE_PROVIDER_SITE_OTHER): Payer: Medicare Other | Admitting: Cardiology

## 2017-08-20 VITALS — BP 174/70 | HR 74 | Ht 64.0 in | Wt 172.4 lb

## 2017-08-20 DIAGNOSIS — I2510 Atherosclerotic heart disease of native coronary artery without angina pectoris: Secondary | ICD-10-CM

## 2017-08-20 DIAGNOSIS — R600 Localized edema: Secondary | ICD-10-CM

## 2017-08-20 DIAGNOSIS — I6523 Occlusion and stenosis of bilateral carotid arteries: Secondary | ICD-10-CM

## 2017-08-20 DIAGNOSIS — I10 Essential (primary) hypertension: Secondary | ICD-10-CM

## 2017-08-20 DIAGNOSIS — E7800 Pure hypercholesterolemia, unspecified: Secondary | ICD-10-CM | POA: Diagnosis not present

## 2017-08-20 MED ORDER — AMLODIPINE BESYLATE 10 MG PO TABS: 10.0000 mg | ORAL_TABLET | Freq: Every day | ORAL | 3 refills | Status: AC

## 2017-08-20 NOTE — Patient Instructions (Addendum)
Medication Instructions:  1) INCREASE AMLODIPINE (Norvasc) to 10 mg daily  Labwork: None  Testing/Procedures: None  Follow-Up: Your provider recommends that you schedule a follow-up appointment in 2-3 weeks in the HTN CLINIC.  Your provider wants you to follow-up in: 6 months with Dr.Turner. You will receive a reminder letter in the mail two months in advance. If you don't receive a letter, please call our office to schedule the follow-up appointment.    Any Other Special Instructions Will Be Listed Below (If Applicable).     If you need a refill on your cardiac medications before your next appointment, please call your pharmacy.

## 2017-09-10 ENCOUNTER — Encounter: Payer: Self-pay | Admitting: Pharmacist

## 2017-09-10 ENCOUNTER — Ambulatory Visit (INDEPENDENT_AMBULATORY_CARE_PROVIDER_SITE_OTHER): Payer: Medicare Other | Admitting: Pharmacist

## 2017-09-10 VITALS — BP 176/64 | HR 82

## 2017-09-10 DIAGNOSIS — I10 Essential (primary) hypertension: Secondary | ICD-10-CM

## 2017-09-10 DIAGNOSIS — I6523 Occlusion and stenosis of bilateral carotid arteries: Secondary | ICD-10-CM

## 2017-09-10 NOTE — Patient Instructions (Addendum)
Use the tylenol as it recommends on the bottle to help with pain  Continue all medications as prescribed.  Try to get an appointment with your spinal or primary doctor to follow up on pain in neck, shoulders, and back.   Follow up in blood pressure clinic in 3-4 weeks.   You can take your blood pressure medications as follows:  Irbesartan 300mg  daily with the morning dose of clonidine 0.1mg  and the furosemide 40mg    Take your second dose of furosemide 40mg  in the mid afternoon Amlodipine 10mg  daily with evening dose of clonidine 0.1mg   Check your blood pressure about 30 minutes to 1 hour after taking your morning blood pressure medications.

## 2017-09-10 NOTE — Progress Notes (Signed)
Patient ID: [name]                 DOB: 1928/08/14                      MRN: [id number]     HPI: [name] is a 84 y.o. female patient of Dr. Radford Bryant who presents today for hypertension follow up. PMH significant for  ASCAD s/p CABG (repeat cath showing 50% ostial SVG to D1, patent LIMA to LAD and patent LCx with nonobstructive disease of the RCA), HTN and dyslipidemia. She has previously been seen in HTN clinic with complaints of being jittery on hydralazine. At her most recent office visit her amlodipine was increased to 10mg  daily.   She presents today stating that both of her shoulders have started aching and leg are aching. She states this has gotten dramatically worse since her last visit and increasingly so since this weekend and her pressures have been elevated with this change. She states that she has some back/neck issues at baseline, but this has gotten worse. She wonders if it is from the amlodipine dose increase.   She also reports that she takes each of her medications separately (thus she is taking a pill every 1-2 hours around the clock to keep them all separated).   Current HTN meds:  Amlodipine 10mg  daily Clonidine 0.1mg  BID Irbesartan 300mg  daily  Furosemide 40mg  BID  Previously tried: lisinopril - unknown, metoprolol - severe skin itching, worsening edema and lip swelling, hydralazine - jittery/nervous/loss of appetite, clonidine at higher doses - drowsiness  BP goal: <150/90 given age  Family History: Diabetes in her mother, sister, and sister; Heart disease in her brother.  Social History: Denies tobacco and alcohol.   Diet: Most meals at home. Rarely adds salt. 1-2 cups of decaf coffee per day.   Exercise: Exercises 3 days a week. Walking mostly.  Home BP readings: 165-191/71-81 - mostly 160s/70s with the exception of last 3 days which are 190s/80s. - pressures before medications in the morning  Wt Readings from Last 3 Encounters:  08/20/17 172 lb 6.4 oz  (78.2 kg)  07/04/17 180 lb 8 oz (81.9 kg)  04/08/17 189 lb 12.8 oz (86.1 kg)   BP Readings from Last 3 Encounters:  09/10/17 (!) 176/64  08/20/17 (!) 174/70  07/04/17 (!) 186/70   Pulse Readings from Last 3 Encounters:  09/10/17 82  08/20/17 74  07/04/17 (!) 107    Renal function: CrCl cannot be calculated (Patient's most recent lab result is older than the maximum 21 days allowed.).  Past Medical History:  Diagnosis Date  . Carotid artery occlusion    1-39% bilateral carotid stenosis by dopplers 05/2016  . Celiac sprue   . Coronary artery disease    s/p CABG with 50% stenosis at ostium of the SVG to dirst diagonal, patent LIMA to LAD and patent left circ with nonobstructive ASCAD of the RCA  . Diabetes mellitus    diet controlled  . Diverticulitis   . Gait disorder   . GERD (gastroesophageal reflux disease)   . Hyperchloremia   . Hyperlipidemia    statin intolerant  . Hypertension   . Meningioma ([place])   . Obesity   . Rosacea     Current Outpatient Medications on File Prior to Visit  Medication Sig Dispense Refill  . amLODipine (NORVASC) 10 MG tablet Take 1 tablet (10 mg total) by mouth daily. 90 tablet 3  . Ascorbic  Acid (VITAMIN C PO) Take 1 tablet by mouth daily at 3 pm.    . aspirin EC 81 MG tablet Take 81 mg by mouth daily at 3 pm.    . Biotin 5000 MCG TABS Take 5,000 mcg by mouth once a week.     . Calcium Citrate (CITRACAL PO) Take 1 tablet by mouth daily at 3 pm.     . Cholecalciferol 4000 UNITS CAPS Take 4,000 Units by mouth daily at 3 pm.    . clindamycin (CLEOCIN) 150 MG capsule Take 150 mg by mouth 4 (four) times daily as needed (dentist appointment).     . cloNIDine (CATAPRES) 0.1 MG tablet Take 0.1 mg by mouth 2 (two) times daily.    . furosemide (LASIX) 40 MG tablet Take 1 tablet (40 mg total) by mouth daily. Morning and mid evening 30 tablet 11  . irbesartan (AVAPRO) 300 MG tablet Take 300 mg by mouth daily.    . Levothyroxine Sodium 50 MCG CAPS Take  1 capsule by mouth daily.     Marland Kitchen METRONIDAZOLE, TOPICAL, 0.75 % LOTN Apply 1 application topically at bedtime as needed (rosacea).    . Multiple Vitamin (MULITIVITAMIN WITH MINERALS) TABS Take 1 tablet by mouth daily at 3 pm.     . pantoprazole (PROTONIX) 40 MG tablet Take 40 mg by mouth daily before breakfast.    . potassium chloride (KLOR-CON) 8 MEQ tablet Take 8 mEq by mouth daily.   11  . pyridOXINE (VITAMIN B-6) 100 MG tablet Take 100 mg by mouth once a week. On Thursday    . vitamin B-12 (CYANOCOBALAMIN) 500 MCG tablet Take 500 mcg by mouth once a week. On Thursdays     No current facility-administered medications on file prior to visit.     Allergies  Allergen Reactions  . Accutane [Isotretinoin] Other (See Comments)    Affected liver  . Ciprofloxacin Rash and Other (See Comments)    Kidney failure  . Erythromycin Diarrhea and Rash  . Gluten Diarrhea and Other (See Comments)    Celiac sprue  . Gluten Meal Diarrhea and Rash    Celiac sprue intolerable  . Isotretinoin Other (See Comments)    Affects liver  . Lipitor [Atorvastatin] Other (See Comments)    Affects legs  . Lisinopril Other (See Comments)    Pt doesn't remember reaction   . Lovastatin Other (See Comments)    Affects legs  . Penicillins Other (See Comments)    From childhood  . Phenytoin Sodium Extended Other (See Comments)    Affects liver  . Pravastatin Other (See Comments)    Affected legs  . Crestor [Rosuvastatin]     Muscle aches even on 5mg  once weekly  . Gabapentin Other (See Comments)    Shaky, swollen legs  . Metoprolol Itching and Swelling  . Prolia [Denosumab] Other (See Comments)    Joints and muscle aches, loss of hair  . Boniva [Ibandronic Acid] Other (See Comments)    headaches  . Clarithromycin Rash  . Hydralazine Rash    Pt does not know what the reactions were.  Marland Kitchen Lescol [Fluvastatin Sodium] Other (See Comments)    Affects legs  . Sulfa Antibiotics Rash  . Sulfasalazine Rash  .  Welchol [Colesevelam Hcl] Other (See Comments)    No energy  . Zetia [Ezetimibe] Other (See Comments)    myalgias  . Zithromax [Azithromycin] Rash    Blood pressure (!) 176/64, pulse 82.   Assessment/Plan: Hypertension: BP is  not at goal today. I believe that some of this is driven by her current pain level, which I do not think is related to the amlodipine (though muscling aching is listed up to 2%). She was not having any issues on amlodipine 7.5mg  daily so it would be strange to see a dramatic change with slight increase to 10mg  daily. Advised she follow up with spine doctor (Dr. Patrice Paradise) for evaluation. Gave her a list of BP medications that can be taken together. Will keep her medication regimen as prescribed for now. We are limited with options due to intolerances. Could consider spironolactone 12.5mg  daily if pressures still elevated at follow up - though will need to watch kidney function closely. Follow up in HTN clinic in 3-4 weeks. Hopefully she will have her appt with spine doc before this time for further work up of pain.     Thank you, Lelan Pons. Patterson Hammersmith, [facility]  09/10/2017 12:20 PM

## 2017-09-16 ENCOUNTER — Ambulatory Visit
Admission: RE | Admit: 2017-09-16 | Discharge: 2017-09-16 | Disposition: A | Discharge status: Home / Self Care | Payer: Medicare Other | Source: Ambulatory Visit | Attending: Family Medicine | Admitting: Family Medicine

## 2017-09-16 DIAGNOSIS — Z1231 Encounter for screening mammogram for malignant neoplasm of breast: Secondary | ICD-10-CM

## 2017-09-18 DIAGNOSIS — R490 Dysphonia: Secondary | ICD-10-CM | POA: Diagnosis not present

## 2017-09-18 DIAGNOSIS — M542 Cervicalgia: Secondary | ICD-10-CM | POA: Diagnosis not present

## 2017-09-27 DIAGNOSIS — L603 Nail dystrophy: Secondary | ICD-10-CM | POA: Diagnosis not present

## 2017-09-27 DIAGNOSIS — I739 Peripheral vascular disease, unspecified: Secondary | ICD-10-CM | POA: Diagnosis not present

## 2017-09-27 DIAGNOSIS — L84 Corns and callosities: Secondary | ICD-10-CM | POA: Diagnosis not present

## 2017-09-27 DIAGNOSIS — E1151 Type 2 diabetes mellitus with diabetic peripheral angiopathy without gangrene: Secondary | ICD-10-CM | POA: Diagnosis not present

## 2017-10-03 DIAGNOSIS — M2550 Pain in unspecified joint: Secondary | ICD-10-CM | POA: Diagnosis not present

## 2017-10-03 DIAGNOSIS — M791 Myalgia, unspecified site: Secondary | ICD-10-CM | POA: Diagnosis not present

## 2017-10-03 DIAGNOSIS — I10 Essential (primary) hypertension: Secondary | ICD-10-CM | POA: Diagnosis not present

## 2017-10-08 ENCOUNTER — Ambulatory Visit (INDEPENDENT_AMBULATORY_CARE_PROVIDER_SITE_OTHER): Payer: Medicare Other | Admitting: Pharmacist

## 2017-10-08 VITALS — BP 162/66 | HR 87

## 2017-10-08 DIAGNOSIS — I6523 Occlusion and stenosis of bilateral carotid arteries: Secondary | ICD-10-CM

## 2017-10-08 DIAGNOSIS — I10 Essential (primary) hypertension: Secondary | ICD-10-CM | POA: Diagnosis not present

## 2017-10-08 NOTE — Progress Notes (Signed)
Patient ID: [name]                 DOB: [birth year]                      MRN: [id number]     HPI: [name] is a 84 y.o. female patient of [name] who presents today for hypertension follow up. PMH significant for  ASCAD s/p CABG (repeat cath showing 50% ostial SVG to D1, patent LIMA to LAD and patent LCx with nonobstructive disease of the RCA), HTN and dyslipidemia. She has previously been seen in HTN clinic with complaints of being jittery on hydralazine. At her most recent office visit no medication changes were made. She presented in a lot of pain due to spinal issues and thus her pressures were likely elevated secondary to this. It was discussed to start spironolactone 12.5mg  daily today if pressures still elevated and monitor closely.    She presents today for follow up. She was recently started on prednisone 15mg  daily for results from labs with [name]. She reports that he started it based on those results and has referred her to a rheumatologist. She is scheduled to follow up with her spinal Dr. On May 1st and has an appt with Rheumatology on May 21st. She reports that she is still in a lot of pain from the muscles and back. She denies dizziness, SOB, chest pain.   She states today that she is not interested in starting a new medication. If anything she wishes to stop/decrease amlodipine as she believe that this is associated with some of her symptoms.   Current HTN meds:  Amlodipine 10mg  daily Clonidine 0.1mg  BID Irbesartan 300mg  daily  Furosemide 40mg  BID  Previously tried: lisinopril - unknown, metoprolol - severe skin itching, worsening edema and lip swelling, hydralazine - jittery/nervous/loss of appetite, clonidine at higher doses - drowsiness  BP goal: <150/90 given age  Family History: Diabetes in her mother, sister, and sister; Heart disease in her brother.  Social History: Denies tobacco and alcohol.   Diet: Most meals at home. Rarely adds salt. 1-2 cups of  decaf coffee per day.   Exercise: Exercises 3 days a week. Walking mostly.  Home BP readings: mostly 160s/60s 150-186/65-76   Wt Readings from Last 3 Encounters:  08/20/17 172 lb 6.4 oz (78.2 kg)  07/04/17 180 lb 8 oz (81.9 kg)  04/08/17 189 lb 12.8 oz (86.1 kg)   BP Readings from Last 3 Encounters:  10/08/17 (!) 162/66  09/10/17 (!) 176/64  08/20/17 (!) 174/70   Pulse Readings from Last 3 Encounters:  10/08/17 87  09/10/17 82  08/20/17 74    Renal function: CrCl cannot be calculated (Patient's most recent lab result is older than the maximum 21 days allowed.).  Past Medical History:  Diagnosis Date  . Carotid artery occlusion    1-39% bilateral carotid stenosis by dopplers 05/2016  . Celiac sprue   . Coronary artery disease    s/p CABG with 50% stenosis at ostium of the SVG to dirst diagonal, patent LIMA to LAD and patent left circ with nonobstructive ASCAD of the RCA  . Diabetes mellitus    diet controlled  . Diverticulitis   . Gait disorder   . GERD (gastroesophageal reflux disease)   . Hyperchloremia   . Hyperlipidemia    statin intolerant  . Hypertension   . Meningioma ([place])   . Obesity   . Rosacea  Current Outpatient Medications on File Prior to Visit  Medication Sig Dispense Refill  . amLODipine (NORVASC) 10 MG tablet Take 1 tablet (10 mg total) by mouth daily. 90 tablet 3  . Ascorbic Acid (VITAMIN C PO) Take 1 tablet by mouth daily at 3 pm.    . aspirin EC 81 MG tablet Take 81 mg by mouth daily at 3 pm.    . Biotin 5000 MCG TABS Take 5,000 mcg by mouth once a week.     . Calcium Citrate (CITRACAL PO) Take 1 tablet by mouth daily at 3 pm.     . Cholecalciferol 4000 UNITS CAPS Take 4,000 Units by mouth daily at 3 pm.    . clindamycin (CLEOCIN) 150 MG capsule Take 150 mg by mouth 4 (four) times daily as needed (dentist appointment).     . cloNIDine (CATAPRES) 0.1 MG tablet Take 0.1 mg by mouth 2 (two) times daily.    . furosemide (LASIX) 40 MG tablet  Take 1 tablet (40 mg total) by mouth daily. Morning and mid evening 30 tablet 11  . irbesartan (AVAPRO) 300 MG tablet Take 300 mg by mouth daily.    . Levothyroxine Sodium 50 MCG CAPS Take 1 capsule by mouth daily.     Marland Kitchen METRONIDAZOLE, TOPICAL, 0.75 % LOTN Apply 1 application topically at bedtime as needed (rosacea).    . Multiple Vitamin (MULITIVITAMIN WITH MINERALS) TABS Take 1 tablet by mouth daily at 3 pm.     . pantoprazole (PROTONIX) 40 MG tablet Take 40 mg by mouth daily before breakfast.    . potassium chloride (KLOR-CON) 8 MEQ tablet Take 8 mEq by mouth daily.   11  . predniSONE (DELTASONE) 5 MG tablet Take 15 mg by mouth daily with breakfast.    . pyridOXINE (VITAMIN B-6) 100 MG tablet Take 100 mg by mouth once a week. On Thursday    . vitamin B-12 (CYANOCOBALAMIN) 500 MCG tablet Take 500 mcg by mouth once a week. On Thursdays     No current facility-administered medications on file prior to visit.     Allergies  Allergen Reactions  . Accutane [Isotretinoin] Other (See Comments)    Affected liver  . Ciprofloxacin Rash and Other (See Comments)    Kidney failure  . Erythromycin Diarrhea and Rash  . Gluten Diarrhea and Other (See Comments)    Celiac sprue  . Gluten Meal Diarrhea and Rash    Celiac sprue intolerable  . Isotretinoin Other (See Comments)    Affects liver  . Lipitor [Atorvastatin] Other (See Comments)    Affects legs  . Lisinopril Other (See Comments)    Pt doesn't remember reaction   . Lovastatin Other (See Comments)    Affects legs  . Penicillins Other (See Comments)    From childhood  . Phenytoin Sodium Extended Other (See Comments)    Affects liver  . Pravastatin Other (See Comments)    Affected legs  . Crestor [Rosuvastatin]     Muscle aches even on 5mg  once weekly  . Gabapentin Other (See Comments)    Shaky, swollen legs  . Metoprolol Itching and Swelling  . Prolia [Denosumab] Other (See Comments)    Joints and muscle aches, loss of hair  .  Boniva [Ibandronic Acid] Other (See Comments)    headaches  . Clarithromycin Rash  . Hydralazine Rash    Pt does not know what the reactions were.  Marland Kitchen Lescol [Fluvastatin Sodium] Other (See Comments)    Affects legs  .  Sulfa Antibiotics Rash  . Sulfasalazine Rash  . Welchol [Colesevelam Hcl] Other (See Comments)    No energy  . Zetia [Ezetimibe] Other (See Comments)    myalgias  . Zithromax [Azithromycin] Rash    Blood pressure (!) 162/66, pulse 87.   Assessment/Plan: Hypertension:   BP today is above goal. Patient does not wish to start new medication at this time or make any changes to regimen. I reiterated that symptoms are unlikely related to amlodipine and she should remain on therapy and she is agreeable to this, but would like to defer additional changes. Advised she monitor pressure and call with changes and issues. Follow up with [name] as scheduled and HTN clinic as needed.    Thank you, Lelan Pons. Patterson Hammersmith, [facility]  10/09/2017 4:01 PM

## 2017-10-08 NOTE — Patient Instructions (Signed)
CONTINUE your medications as prescribed.   Call our office with any issues or if pressures increase. [phone]  Call in [place] to schedule an appointment with Dr. Radford Pax in September 2019.

## 2017-10-09 ENCOUNTER — Encounter: Payer: Self-pay | Admitting: Pharmacist

## 2017-10-16 DIAGNOSIS — M542 Cervicalgia: Secondary | ICD-10-CM | POA: Diagnosis not present

## 2017-10-16 DIAGNOSIS — M545 Low back pain: Secondary | ICD-10-CM | POA: Diagnosis not present

## 2017-10-16 DIAGNOSIS — H1859 Other hereditary corneal dystrophies: Secondary | ICD-10-CM | POA: Diagnosis not present

## 2017-10-16 DIAGNOSIS — Z961 Presence of intraocular lens: Secondary | ICD-10-CM | POA: Diagnosis not present

## 2017-10-30 DIAGNOSIS — M109 Gout, unspecified: Secondary | ICD-10-CM | POA: Diagnosis not present

## 2017-10-30 DIAGNOSIS — M810 Age-related osteoporosis without current pathological fracture: Secondary | ICD-10-CM | POA: Diagnosis not present

## 2017-10-30 DIAGNOSIS — E7800 Pure hypercholesterolemia, unspecified: Secondary | ICD-10-CM | POA: Diagnosis not present

## 2017-10-30 DIAGNOSIS — I779 Disorder of arteries and arterioles, unspecified: Secondary | ICD-10-CM | POA: Diagnosis not present

## 2017-10-30 DIAGNOSIS — E559 Vitamin D deficiency, unspecified: Secondary | ICD-10-CM | POA: Diagnosis not present

## 2017-10-30 DIAGNOSIS — I2510 Atherosclerotic heart disease of native coronary artery without angina pectoris: Secondary | ICD-10-CM | POA: Diagnosis not present

## 2017-10-30 DIAGNOSIS — M791 Myalgia, unspecified site: Secondary | ICD-10-CM | POA: Diagnosis not present

## 2017-10-30 DIAGNOSIS — E118 Type 2 diabetes mellitus with unspecified complications: Secondary | ICD-10-CM | POA: Diagnosis not present

## 2017-10-30 DIAGNOSIS — E039 Hypothyroidism, unspecified: Secondary | ICD-10-CM | POA: Diagnosis not present

## 2017-10-30 DIAGNOSIS — Z471 Aftercare following joint replacement surgery: Secondary | ICD-10-CM | POA: Diagnosis not present

## 2017-10-30 DIAGNOSIS — E1122 Type 2 diabetes mellitus with diabetic chronic kidney disease: Secondary | ICD-10-CM | POA: Diagnosis not present

## 2017-10-30 DIAGNOSIS — I10 Essential (primary) hypertension: Secondary | ICD-10-CM | POA: Diagnosis not present

## 2017-10-30 DIAGNOSIS — M1711 Unilateral primary osteoarthritis, right knee: Secondary | ICD-10-CM | POA: Diagnosis not present

## 2017-10-30 DIAGNOSIS — Z96651 Presence of right artificial knee joint: Secondary | ICD-10-CM | POA: Diagnosis not present

## 2017-11-04 DIAGNOSIS — E669 Obesity, unspecified: Secondary | ICD-10-CM | POA: Diagnosis not present

## 2017-11-04 DIAGNOSIS — M353 Polymyalgia rheumatica: Secondary | ICD-10-CM | POA: Diagnosis not present

## 2017-11-04 DIAGNOSIS — Z6832 Body mass index (BMI) 32.0-32.9, adult: Secondary | ICD-10-CM | POA: Diagnosis not present

## 2017-11-04 DIAGNOSIS — M2550 Pain in unspecified joint: Secondary | ICD-10-CM | POA: Diagnosis not present

## 2017-11-06 DIAGNOSIS — N2581 Secondary hyperparathyroidism of renal origin: Secondary | ICD-10-CM | POA: Diagnosis not present

## 2017-11-06 DIAGNOSIS — D631 Anemia in chronic kidney disease: Secondary | ICD-10-CM | POA: Diagnosis not present

## 2017-11-06 DIAGNOSIS — I129 Hypertensive chronic kidney disease with stage 1 through stage 4 chronic kidney disease, or unspecified chronic kidney disease: Secondary | ICD-10-CM | POA: Diagnosis not present

## 2017-11-06 DIAGNOSIS — N183 Chronic kidney disease, stage 3 (moderate): Secondary | ICD-10-CM | POA: Diagnosis not present

## 2017-12-02 DIAGNOSIS — M2550 Pain in unspecified joint: Secondary | ICD-10-CM | POA: Diagnosis not present

## 2017-12-02 DIAGNOSIS — Z6831 Body mass index (BMI) 31.0-31.9, adult: Secondary | ICD-10-CM | POA: Diagnosis not present

## 2017-12-02 DIAGNOSIS — M353 Polymyalgia rheumatica: Secondary | ICD-10-CM | POA: Diagnosis not present

## 2017-12-02 DIAGNOSIS — E669 Obesity, unspecified: Secondary | ICD-10-CM | POA: Diagnosis not present

## 2017-12-12 DIAGNOSIS — E1122 Type 2 diabetes mellitus with diabetic chronic kidney disease: Secondary | ICD-10-CM | POA: Diagnosis not present

## 2017-12-12 DIAGNOSIS — R8290 Unspecified abnormal findings in urine: Secondary | ICD-10-CM | POA: Diagnosis not present

## 2017-12-12 DIAGNOSIS — R1031 Right lower quadrant pain: Secondary | ICD-10-CM | POA: Diagnosis not present

## 2017-12-13 ENCOUNTER — Other Ambulatory Visit: Payer: Self-pay

## 2017-12-13 ENCOUNTER — Emergency Department (HOSPITAL_COMMUNITY): Payer: Medicare Other

## 2017-12-13 ENCOUNTER — Encounter (HOSPITAL_COMMUNITY): Payer: Self-pay | Admitting: *Deleted

## 2017-12-13 ENCOUNTER — Inpatient Hospital Stay (HOSPITAL_COMMUNITY)
Admission: EM | Admit: 2017-12-13 | Discharge: 2017-12-15 | DRG: 661 | Disposition: A | Discharge status: Home / Self Care | Payer: Medicare Other | Attending: Urology | Admitting: Urology

## 2017-12-13 DIAGNOSIS — Z79899 Other long term (current) drug therapy: Secondary | ICD-10-CM | POA: Diagnosis not present

## 2017-12-13 DIAGNOSIS — N131 Hydronephrosis with ureteral stricture, not elsewhere classified: Principal | ICD-10-CM | POA: Diagnosis present

## 2017-12-13 DIAGNOSIS — D3021 Benign neoplasm of right ureter: Secondary | ICD-10-CM | POA: Diagnosis present

## 2017-12-13 DIAGNOSIS — Z96659 Presence of unspecified artificial knee joint: Secondary | ICD-10-CM | POA: Diagnosis present

## 2017-12-13 DIAGNOSIS — Z6832 Body mass index (BMI) 32.0-32.9, adult: Secondary | ICD-10-CM

## 2017-12-13 DIAGNOSIS — Z880 Allergy status to penicillin: Secondary | ICD-10-CM

## 2017-12-13 DIAGNOSIS — L719 Rosacea, unspecified: Secondary | ICD-10-CM | POA: Diagnosis not present

## 2017-12-13 DIAGNOSIS — N1330 Unspecified hydronephrosis: Secondary | ICD-10-CM | POA: Diagnosis not present

## 2017-12-13 DIAGNOSIS — I10 Essential (primary) hypertension: Secondary | ICD-10-CM | POA: Diagnosis present

## 2017-12-13 DIAGNOSIS — E1142 Type 2 diabetes mellitus with diabetic polyneuropathy: Secondary | ICD-10-CM | POA: Diagnosis not present

## 2017-12-13 DIAGNOSIS — Z91018 Allergy to other foods: Secondary | ICD-10-CM | POA: Diagnosis not present

## 2017-12-13 DIAGNOSIS — E785 Hyperlipidemia, unspecified: Secondary | ICD-10-CM | POA: Diagnosis present

## 2017-12-13 DIAGNOSIS — R1011 Right upper quadrant pain: Secondary | ICD-10-CM | POA: Diagnosis not present

## 2017-12-13 DIAGNOSIS — N261 Atrophy of kidney (terminal): Secondary | ICD-10-CM | POA: Diagnosis not present

## 2017-12-13 DIAGNOSIS — E669 Obesity, unspecified: Secondary | ICD-10-CM | POA: Diagnosis not present

## 2017-12-13 DIAGNOSIS — Z888 Allergy status to other drugs, medicaments and biological substances status: Secondary | ICD-10-CM | POA: Diagnosis not present

## 2017-12-13 DIAGNOSIS — D4959 Neoplasm of unspecified behavior of other genitourinary organ: Secondary | ICD-10-CM | POA: Diagnosis not present

## 2017-12-13 DIAGNOSIS — K219 Gastro-esophageal reflux disease without esophagitis: Secondary | ICD-10-CM | POA: Diagnosis present

## 2017-12-13 DIAGNOSIS — Z881 Allergy status to other antibiotic agents status: Secondary | ICD-10-CM

## 2017-12-13 DIAGNOSIS — N132 Hydronephrosis with renal and ureteral calculous obstruction: Secondary | ICD-10-CM | POA: Diagnosis not present

## 2017-12-13 DIAGNOSIS — N200 Calculus of kidney: Secondary | ICD-10-CM | POA: Diagnosis not present

## 2017-12-13 DIAGNOSIS — Z951 Presence of aortocoronary bypass graft: Secondary | ICD-10-CM

## 2017-12-13 DIAGNOSIS — Z86011 Personal history of benign neoplasm of the brain: Secondary | ICD-10-CM

## 2017-12-13 DIAGNOSIS — K900 Celiac disease: Secondary | ICD-10-CM | POA: Diagnosis present

## 2017-12-13 DIAGNOSIS — R197 Diarrhea, unspecified: Secondary | ICD-10-CM | POA: Diagnosis not present

## 2017-12-13 DIAGNOSIS — I2510 Atherosclerotic heart disease of native coronary artery without angina pectoris: Secondary | ICD-10-CM | POA: Diagnosis present

## 2017-12-13 DIAGNOSIS — R112 Nausea with vomiting, unspecified: Secondary | ICD-10-CM | POA: Diagnosis not present

## 2017-12-13 DIAGNOSIS — R1031 Right lower quadrant pain: Secondary | ICD-10-CM | POA: Diagnosis not present

## 2017-12-13 LAB — COMPREHENSIVE METABOLIC PANEL
ALBUMIN: 4.1 g/dL (ref 3.5–5.0)
ALT: 22 U/L (ref 0–44)
AST: 25 U/L (ref 15–41)
Alkaline Phosphatase: 78 U/L (ref 38–126)
Anion gap: 11 (ref 5–15)
BILIRUBIN TOTAL: 1.5 mg/dL — AB (ref 0.3–1.2)
BUN: 18 mg/dL (ref 8–23)
CHLORIDE: 98 mmol/L (ref 98–111)
CO2: 31 mmol/L (ref 22–32)
CREATININE: 1.44 mg/dL — AB (ref 0.44–1.00)
Calcium: 9 mg/dL (ref 8.9–10.3)
GFR calc Af Amer: 37 mL/min — ABNORMAL LOW (ref >60)
GFR, EST NON AFRICAN AMERICAN: 32 mL/min — AB (ref >60)
GLUCOSE: 178 mg/dL — AB (ref 70–99)
Potassium: 3.4 mmol/L — ABNORMAL LOW (ref 3.5–5.1)
Sodium: 140 mmol/L (ref 135–145)
TOTAL PROTEIN: 7.8 g/dL (ref 6.5–8.1)

## 2017-12-13 LAB — URINALYSIS, ROUTINE W REFLEX MICROSCOPIC
Bilirubin Urine: NEGATIVE
GLUCOSE, UA: NEGATIVE mg/dL
KETONES UR: NEGATIVE mg/dL
Nitrite: NEGATIVE
PH: 6 (ref 5.0–8.0)
Protein, ur: NEGATIVE mg/dL
SPECIFIC GRAVITY, URINE: 1.011 (ref 1.005–1.030)

## 2017-12-13 LAB — CBC
HCT: 40.7 % (ref 36.0–46.0)
Hemoglobin: 12.8 g/dL (ref 12.0–15.0)
MCH: 24 pg — ABNORMAL LOW (ref 26.0–34.0)
MCHC: 31.4 g/dL (ref 30.0–36.0)
MCV: 76.2 fL — AB (ref 78.0–100.0)
PLATELETS: 322 10*3/uL (ref 150–400)
RBC: 5.34 MIL/uL — ABNORMAL HIGH (ref 3.87–5.11)
RDW: 19.2 % — AB (ref 11.5–15.5)
WBC: 13.6 10*3/uL — AB (ref 4.0–10.5)

## 2017-12-13 LAB — LIPASE, BLOOD: Lipase: 33 U/L (ref 11–51)

## 2017-12-13 MED ORDER — LEVOTHYROXINE SODIUM 50 MCG PO TABS
50.0000 ug | ORAL_TABLET | Freq: Every day | ORAL | Status: DC
  Administered 2017-12-14 – 2017-12-15 (×2): 50 ug via ORAL
  Filled 2017-12-13 (×2): qty 1

## 2017-12-13 MED ORDER — POTASSIUM CHLORIDE ER 8 MEQ PO TBCR: 8.0000 meq | EXTENDED_RELEASE_TABLET | Freq: Every day | ORAL | Status: DC

## 2017-12-13 MED ORDER — CEFAZOLIN SODIUM-DEXTROSE 1-4 GM/50ML-% IV SOLN
1.0000 g | Freq: Three times a day (TID) | INTRAVENOUS | Status: DC
  Administered 2017-12-13 – 2017-12-14 (×2): 1 g via INTRAVENOUS
  Filled 2017-12-13 (×3): qty 50

## 2017-12-13 MED ORDER — SENNA 8.6 MG PO TABS
1.0000 | ORAL_TABLET | Freq: Two times a day (BID) | ORAL | Status: DC
  Administered 2017-12-15: 8.6 mg via ORAL
  Filled 2017-12-13 (×3): qty 1

## 2017-12-13 MED ORDER — SODIUM CHLORIDE 0.9 % IV BOLUS (SEPSIS)
500.0000 mL | Freq: Once | INTRAVENOUS | Status: AC
  Administered 2017-12-13: 500 mL via INTRAVENOUS

## 2017-12-13 MED ORDER — DM-GUAIFENESIN ER 30-600 MG PO TB12
1.0000 | ORAL_TABLET | Freq: Two times a day (BID) | ORAL | Status: DC
  Administered 2017-12-14 – 2017-12-15 (×2): 1 via ORAL
  Filled 2017-12-13 (×3): qty 1

## 2017-12-13 MED ORDER — HYDROMORPHONE HCL 1 MG/ML IJ SOLN
0.5000 mg | Freq: Once | INTRAMUSCULAR | Status: AC
  Administered 2017-12-13: 0.5 mg via INTRAVENOUS
  Filled 2017-12-13: qty 1

## 2017-12-13 MED ORDER — MORPHINE SULFATE (PF) 4 MG/ML IV SOLN
4.0000 mg | Freq: Once | INTRAVENOUS | Status: AC
  Administered 2017-12-13: 4 mg via INTRAVENOUS
  Filled 2017-12-13: qty 1

## 2017-12-13 MED ORDER — AZELASTINE HCL 0.1 % NA SOLN
1.0000 | Freq: Two times a day (BID) | NASAL | Status: DC
  Administered 2017-12-14 – 2017-12-15 (×2): 1 via NASAL
  Filled 2017-12-13: qty 30

## 2017-12-13 MED ORDER — MUPIROCIN 2 % EX OINT
1.0000 "application " | TOPICAL_OINTMENT | Freq: Two times a day (BID) | CUTANEOUS | Status: DC
  Administered 2017-12-14 – 2017-12-15 (×2): 1 via NASAL
  Filled 2017-12-13: qty 22

## 2017-12-13 MED ORDER — AMLODIPINE BESYLATE 10 MG PO TABS
10.0000 mg | ORAL_TABLET | Freq: Every day | ORAL | Status: DC
  Administered 2017-12-14 – 2017-12-15 (×2): 10 mg via ORAL
  Filled 2017-12-13 (×2): qty 1

## 2017-12-13 MED ORDER — IRBESARTAN 300 MG PO TABS
300.0000 mg | ORAL_TABLET | Freq: Every day | ORAL | Status: DC
  Administered 2017-12-14 – 2017-12-15 (×2): 300 mg via ORAL
  Filled 2017-12-13 (×2): qty 1

## 2017-12-13 MED ORDER — ENOXAPARIN SODIUM 30 MG/0.3ML ~~LOC~~ SOLN
30.0000 mg | SUBCUTANEOUS | Status: DC
  Administered 2017-12-13 – 2017-12-14 (×2): 30 mg via SUBCUTANEOUS
  Filled 2017-12-13 (×2): qty 0.3

## 2017-12-13 MED ORDER — ONDANSETRON HCL 4 MG/2ML IJ SOLN
4.0000 mg | Freq: Once | INTRAMUSCULAR | Status: AC
  Administered 2017-12-13: 4 mg via INTRAVENOUS
  Filled 2017-12-13: qty 2

## 2017-12-13 MED ORDER — PANTOPRAZOLE SODIUM 40 MG PO TBEC
40.0000 mg | DELAYED_RELEASE_TABLET | Freq: Every day | ORAL | Status: DC
  Administered 2017-12-14 – 2017-12-15 (×2): 40 mg via ORAL
  Filled 2017-12-13 (×2): qty 1

## 2017-12-13 MED ORDER — PREDNISONE 5 MG PO TABS
5.0000 mg | ORAL_TABLET | Freq: Two times a day (BID) | ORAL | Status: DC
  Administered 2017-12-14 – 2017-12-15 (×3): 5 mg via ORAL
  Filled 2017-12-13 (×3): qty 1

## 2017-12-13 MED ORDER — CLONIDINE HCL 0.1 MG PO TABS
0.1000 mg | ORAL_TABLET | Freq: Two times a day (BID) | ORAL | Status: DC
  Administered 2017-12-13 – 2017-12-15 (×4): 0.1 mg via ORAL
  Filled 2017-12-13 (×4): qty 1

## 2017-12-13 MED ORDER — SODIUM CHLORIDE 0.45 % IV SOLN
INTRAVENOUS | Status: DC
  Administered 2017-12-13: 20:00:00 via INTRAVENOUS

## 2017-12-13 MED ORDER — HYDROMORPHONE HCL 1 MG/ML IJ SOLN
0.5000 mg | INTRAMUSCULAR | Status: DC | PRN
  Administered 2017-12-13: 1 mg via INTRAVENOUS
  Administered 2017-12-13: 0.5 mg via INTRAVENOUS
  Administered 2017-12-14 – 2017-12-15 (×10): 1 mg via INTRAVENOUS
  Filled 2017-12-13 (×12): qty 1

## 2017-12-13 MED ORDER — ONDANSETRON HCL 4 MG/2ML IJ SOLN
4.0000 mg | INTRAMUSCULAR | Status: DC | PRN
  Administered 2017-12-13 – 2017-12-14 (×4): 4 mg via INTRAVENOUS
  Filled 2017-12-13 (×5): qty 2

## 2017-12-13 MED ORDER — CARVEDILOL 3.125 MG PO TABS
3.1250 mg | ORAL_TABLET | Freq: Two times a day (BID) | ORAL | Status: DC
  Administered 2017-12-13 – 2017-12-15 (×4): 3.125 mg via ORAL
  Filled 2017-12-13 (×4): qty 1

## 2017-12-13 MED ORDER — FUROSEMIDE 40 MG PO TABS
40.0000 mg | ORAL_TABLET | Freq: Two times a day (BID) | ORAL | Status: DC
  Administered 2017-12-14 – 2017-12-15 (×3): 40 mg via ORAL
  Filled 2017-12-13 (×3): qty 1

## 2017-12-13 MED ORDER — METRONIDAZOLE 0.75 % EX GEL: 1.0000 "application " | Freq: Every evening | CUTANEOUS | Status: DC | PRN

## 2017-12-13 MED ORDER — FENTANYL CITRATE (PF) 100 MCG/2ML IJ SOLN
50.0000 ug | Freq: Once | INTRAMUSCULAR | Status: AC
  Administered 2017-12-13: 50 ug via INTRAVENOUS
  Filled 2017-12-13: qty 2

## 2017-12-13 NOTE — H&P (Signed)
H&P  Chief Complaint: Right flank pain  History of Present Illness: [name] is a 84 y.o. year old female with no long-standing urologic history who presented to the emergency room today with a 5-day history of right flank and abdominal pain, worsening today, as well as nausea and vomiting.  She has had some chills but no fevers.  She was recently started on antibiotics yesterday by Dr. Hulan Fess for presumed urinary tract infection.  In the emergency room here at [facility], the patient had a CT of the abdomen and pelvis.  This revealed moderate right hydroureteronephrosis down to the right distal ureter with a transition point seen, but no evidence of ureteral calculus.  She has an atrophic left kidney.  Creatinine is 1.44.  Urologic consultation is requested due to the patient's persistent pain despite regular doses of IV narcotics.  She is admitted for further management.  Past Medical History:  Diagnosis Date  . Carotid artery occlusion    1-39% bilateral carotid stenosis by dopplers 05/2016  . Celiac sprue   . Coronary artery disease    s/p CABG with 50% stenosis at ostium of the SVG to dirst diagonal, patent LIMA to LAD and patent left circ with nonobstructive ASCAD of the RCA  . Diabetes mellitus    diet controlled  . Diverticulitis   . Gait disorder   . GERD (gastroesophageal reflux disease)   . Hyperchloremia   . Hyperlipidemia    statin intolerant  . Hypertension   . Meningioma ([place])   . Obesity   . Rosacea     Past Surgical History:  Procedure Laterality Date  . ABDOMINAL HYSTERECTOMY    . BRAIN TUMOR EXCISION  1993   meningioma, left frontal  . BUNIONECTOMY    . CARDIAC CATHETERIZATION    . CORONARY ARTERY BYPASS GRAFT  2002    3 vessel  . JOINT REPLACEMENT  2009  . REPLACEMENT TOTAL KNEE    . ROTATOR CUFF REPAIR  2010   Dr. Gladstone Lighter  . TONSILLECTOMY      Home Medications:   (Not in a hospital admission)  Allergies:  Allergies  Allergen Reactions   . Accutane [Isotretinoin] Other (See Comments)    Affected liver  . Ciprofloxacin Rash and Other (See Comments)    Kidney failure  . Erythromycin Diarrhea and Rash  . Gluten Diarrhea and Other (See Comments)    Celiac sprue  . Gluten Meal Diarrhea and Rash    Celiac sprue intolerable  . Isotretinoin Other (See Comments)    Affects liver  . Lipitor [Atorvastatin] Other (See Comments)    Affects legs  . Lisinopril Other (See Comments)    Pt doesn't remember reaction   . Lovastatin Other (See Comments)    Affects legs  . Penicillins Other (See Comments)    From childhood  . Phenytoin Sodium Extended Other (See Comments)    Affects liver  . Pravastatin Other (See Comments)    Affected legs  . Crestor [Rosuvastatin]     Muscle aches even on 5mg  once weekly  . Gabapentin Other (See Comments)    Shaky, swollen legs  . Metoprolol Itching and Swelling  . Prolia [Denosumab] Other (See Comments)    Joints and muscle aches, loss of hair  . Boniva [Ibandronic Acid] Other (See Comments)    headaches  . Clarithromycin Rash  . Hydralazine Rash    Pt does not know what the reactions were.  Marland Kitchen Lescol [Fluvastatin Sodium] Other (See  Comments)    Affects legs  . Sulfa Antibiotics Rash  . Sulfasalazine Rash  . Welchol [Colesevelam Hcl] Other (See Comments)    No energy  . Zetia [Ezetimibe] Other (See Comments)    myalgias  . Zithromax [Azithromycin] Rash    Family History  Problem Relation Age of Onset  . Diabetes Mother   . Heart disease Brother   . Diabetes Sister   . Diabetes Sister     Social History:  reports that she has never smoked. She has never used smokeless tobacco. She reports that she does not drink alcohol or use drugs.  ROS: A complete review of systems was performed.  All systems are negative except for pertinent findings as noted.  Physical Exam:  Vital signs in last 24 hours: Temp:  [97.6 F (36.4 C)] 97.6 F (36.4 C) (06/28 1354) Pulse Rate:  [70-78]  76 (06/28 1800) Resp:  [18] 18 (06/28 1354) BP: (174-196)/(61-69) 175/62 (06/28 1800) SpO2:  [90 %-100 %] 100 % (06/28 1800) General:  Alert and oriented, mild to moderate distress HEENT: Normocephalic, atraumatic Neck: No JVD or lymphadenopathy Cardiovascular: Regular rate and rhythm Lungs: Normal inspiratory and expiratory excursion Abdomen: Obese, soft, left and right lower quadrant tenderness.  No rebound or guarding.   Back: No CVA tenderness Extremities: No edema Neurologic: Grossly intact  Laboratory Data:  Results for orders placed or performed during the hospital encounter of 12/13/17 (from the past 24 hour(s))  Lipase, blood     Status: None   Collection Time: 12/13/17  2:28 PM  Result Value Ref Range   Lipase 33 11 - 51 U/L  Comprehensive metabolic panel     Status: Abnormal   Collection Time: 12/13/17  2:28 PM  Result Value Ref Range   Sodium 140 135 - 145 mmol/L   Potassium 3.4 (L) 3.5 - 5.1 mmol/L   Chloride 98 98 - 111 mmol/L   CO2 31 22 - 32 mmol/L   Glucose, Bld 178 (H) 70 - 99 mg/dL   BUN 18 8 - 23 mg/dL   Creatinine, Ser 1.44 (H) 0.44 - 1.00 mg/dL   Calcium 9.0 8.9 - 10.3 mg/dL   Total Protein 7.8 6.5 - 8.1 g/dL   Albumin 4.1 3.5 - 5.0 g/dL   AST 25 15 - 41 U/L   ALT 22 0 - 44 U/L   Alkaline Phosphatase 78 38 - 126 U/L   Total Bilirubin 1.5 (H) 0.3 - 1.2 mg/dL   GFR calc non Af Amer 32 (L) >60 mL/min   GFR calc Af Amer 37 (L) >60 mL/min   Anion gap 11 5 - 15  CBC     Status: Abnormal   Collection Time: 12/13/17  2:28 PM  Result Value Ref Range   WBC 13.6 (H) 4.0 - 10.5 K/uL   RBC 5.34 (H) 3.87 - 5.11 MIL/uL   Hemoglobin 12.8 12.0 - 15.0 g/dL   HCT 40.7 36.0 - 46.0 %   MCV 76.2 (L) 78.0 - 100.0 fL   MCH 24.0 (L) 26.0 - 34.0 pg   MCHC 31.4 30.0 - 36.0 g/dL   RDW 19.2 (H) 11.5 - 15.5 %   Platelets 322 150 - 400 K/uL  Urinalysis, Routine w reflex microscopic     Status: Abnormal   Collection Time: 12/13/17  3:25 PM  Result Value Ref Range   Color,  Urine YELLOW YELLOW   APPearance HAZY (A) CLEAR   Specific Gravity, Urine 1.011 1.005 - 1.030  pH 6.0 5.0 - 8.0   Glucose, UA NEGATIVE NEGATIVE mg/dL   Hgb urine dipstick SMALL (A) NEGATIVE   Bilirubin Urine NEGATIVE NEGATIVE   Ketones, ur NEGATIVE NEGATIVE mg/dL   Protein, ur NEGATIVE NEGATIVE mg/dL   Nitrite NEGATIVE NEGATIVE   Leukocytes, UA SMALL (A) NEGATIVE   RBC / HPF 21-50 0 - 5 RBC/hpf   WBC, UA 0-5 0 - 5 WBC/hpf   Bacteria, UA RARE (A) NONE SEEN   Squamous Epithelial / LPF 0-5 0 - 5   No results found for this or any previous visit (from the past 240 hour(s)). Creatinine: Recent Labs    12/13/17 1428  CREATININE 1.44*    Radiologic Imaging: Ct Renal Stone Study  Result Date: 12/13/2017 CLINICAL DATA:  Right-sided abdominal pain and vomiting for the past 5 days. EXAM: CT ABDOMEN AND PELVIS WITHOUT CONTRAST TECHNIQUE: Multidetector CT imaging of the abdomen and pelvis was performed following the standard protocol without IV contrast. COMPARISON:  CT abdomen pelvis dated January 13, 2015. FINDINGS: Lower chest: No acute abnormality. Hepatobiliary: No focal liver abnormality is seen. No gallstones, gallbladder [place] thickening, or biliary dilatation. Pancreas: Mild atrophy. No ductal dilatation or surrounding inflammatory changes. Spleen: No splenic injury or perisplenic hematoma. Adrenals/Urinary Tract: The adrenal glands are unremarkable. Moderate to severe left and mild right cortical atrophy. Unchanged small bilateral renal cysts. No renal or ureteral calculi. Mild right hydroureteronephrosis to the level of the distal ureter. No obstructing stone or lesion. Stomach/Bowel: Stomach is within normal limits. Appendix appears normal. No evidence of bowel Wisinski thickening, distention, or inflammatory changes. Extensive sigmoid diverticulosis. Vascular/Lymphatic: Aortic atherosclerosis. No enlarged abdominal or pelvic lymph nodes. Reproductive: Status post hysterectomy. No adnexal masses.  Other: No free fluid or pneumoperitoneum. Musculoskeletal: No acute or significant osseous findings. Unchanged chronic mild L1 compression fracture. Stable severe degenerative changes of the lumbar spine. IMPRESSION: 1. Mild right hydroureteronephrosis to the level of the distal ureter without obstructing stone or lesion. 2. Sigmoid diverticulosis.  No evidence of acute diverticulitis. 3.  Aortic atherosclerosis (ICD10-I70.0). Electronically Signed   By: Titus Dubin M.D.   On: 12/13/2017 17:29    Impression/Assessment:  Right hydronephrosis, question acute or long-standing with significant right flank pain.  Plan:  I will admit her for pain management as well as hydration.  Urine was cultured, she has been on antibiotics which are continued.  I will schedule cystoscopy, right retrograde, possible ureteroscopy versus stent placement tomorrow morning.  The patient and her daughter, [name] were instructed in the procedure and desire to commence with this.  Lillette Boxer Vencil Basnett 12/13/2017, 6:44 PM  Lillette Boxer. Aniah Pauli MD

## 2017-12-13 NOTE — ED Triage Notes (Signed)
Pt complains of right sided abdominal pain and vomting x 5 days. Pt went to PCP and told she possibly had uti or diverticulitis, was prescribed abx to treat for possible, which she started last night. Pt states her symptoms feel worse today

## 2017-12-13 NOTE — ED Provider Notes (Signed)
[facility] DEPT Provider Note   CSN: [id number] Arrival date & time: 12/13/17  1312     History   Chief Complaint Chief Complaint  Patient presents with  . Abdominal Pain    HPI ARMYA WESTERHOFF is a 84 y.o. female.  Patient is a 84 year old female who presents with abdominal pain.  She states about 5 days ago she had an episode of vomiting and diarrhea.  This resolved however over the last 2 days she has had some worsening pain in her right abdomen.  She denies any radiation to her back.  Shee states is been constant and worsening since yesterday.  She had some associated nausea and vomiting.  She feels cramping like she has to have diarrhea but she denies any recent episodes of diarrhea.  No known fevers.  Her PCP started her on Keflex yesterday for possible UTI.  She currently denies any urinary symptoms.  She denies any history of similar symptoms in the past.  She had a partial hysterectomy but no other abdominal surgeries.  She does have a prior history of diverticulitis and has a history of celiac disease.  She also has had one kidney stone in the past.     Past Medical History:  Diagnosis Date  . Carotid artery occlusion    1-39% bilateral carotid stenosis by dopplers 05/2016  . Celiac sprue   . Coronary artery disease    s/p CABG with 50% stenosis at ostium of the SVG to dirst diagonal, patent LIMA to LAD and patent left circ with nonobstructive ASCAD of the RCA  . Diabetes mellitus    diet controlled  . Diverticulitis   . Gait disorder   . GERD (gastroesophageal reflux disease)   . Hyperchloremia   . Hyperlipidemia    statin intolerant  . Hypertension   . Meningioma ([place])   . Obesity   . Rosacea     Patient Active Problem List   Diagnosis Date Noted  . Hydronephrosis 12/13/2017  . Type 2 diabetes mellitus with complication ([place]) 44/31/5400  . Edema extremities 05/03/2014  . Bilateral carotid artery stenosis   . Right carotid bruit  05/07/2013  . Heart murmur 05/07/2013  . Coronary artery disease   . Hyperlipidemia   . Hypertension   . Pain in limb 01/23/2013  . Pneumonia 08/05/2011  . Diabetes mellitus ([place]) 08/05/2011  . Cerebrovascular disease, unspecified 10/10/2010  . Dizziness and giddiness 10/10/2010  . Abnormality of gait 10/10/2010    Past Surgical History:  Procedure Laterality Date  . ABDOMINAL HYSTERECTOMY    . BRAIN TUMOR EXCISION  1993   meningioma, left frontal  . BUNIONECTOMY    . CARDIAC CATHETERIZATION    . CORONARY ARTERY BYPASS GRAFT  2002    3 vessel  . JOINT REPLACEMENT  2009  . REPLACEMENT TOTAL KNEE    . ROTATOR CUFF REPAIR  2010   Dr. Gladstone Lighter  . TONSILLECTOMY       OB History   None      Home Medications    Prior to Admission medications   Medication Sig Start Date End Date Taking? Authorizing Provider  amLODipine (NORVASC) 10 MG tablet Take 1 tablet (10 mg total) by mouth daily. 08/20/17 08/15/18 Yes Turner, Eber Hong, MD  Ascorbic Acid (VITAMIN C PO) Take 1 tablet by mouth daily at 3 pm.   Yes [provider]  aspirin EC 81 MG tablet Take 81 mg by mouth daily at 3 pm.  Yes [provider]  azelastine (ASTELIN) 0.1 % nasal spray Place 1 spray into both nostrils 2 (two) times daily. Use in each nostril as directed   Yes [provider]  Biotin 5000 MCG TABS Take 5,000 mcg by mouth once a week.    Yes [provider]  Calcium Citrate (CITRACAL PO) Take 1 tablet by mouth daily at 3 pm.    Yes [provider]  carvedilol (COREG) 3.125 MG tablet Take 3.125 mg by mouth 2 (two) times daily. 11/06/17  Yes [provider]  cephALEXin (KEFLEX) 500 MG capsule Take 500 mg by mouth 3 (three) times daily. 12/12/17  Yes [provider]  Cholecalciferol 4000 UNITS CAPS Take 4,000 Units by mouth daily at 3 pm.   Yes [provider]  cloNIDine (CATAPRES) 0.1 MG tablet Take 0.1 mg by mouth 2 (two) times daily.   Yes [provider]  dextromethorphan-guaiFENesin (MUCINEX DM) 30-600 MG 12hr tablet Take 1 tablet by mouth 2 (two) times daily.   Yes [provider]  furosemide (LASIX) 40 MG tablet Take 1 tablet (40 mg total) by mouth daily. Morning and mid evening Patient taking differently: Take 40 mg by mouth 2 (two) times daily. Morning and mid evening 01/07/17  Yes Turner, Traci R, MD  irbesartan (AVAPRO) 300 MG tablet Take 300 mg by mouth daily.   Yes [provider]  Levothyroxine Sodium 50 MCG CAPS Take 1 capsule by mouth daily.    Yes [provider]  metroNIDAZOLE (FLAGYL) 500 MG tablet Take 500 mg by mouth 3 (three) times daily. for 7 days 12/12/17  Yes [provider]  METRONIDAZOLE, TOPICAL, 0.75 % LOTN Apply 1 application topically at bedtime as needed (rosacea).   Yes [provider]  Multiple Vitamin (MULITIVITAMIN WITH MINERALS) TABS Take 1 tablet by mouth daily at 3 pm.    Yes [provider]  pantoprazole (PROTONIX) 40 MG tablet Take 40 mg by mouth daily before breakfast.   Yes [provider]  potassium chloride (KLOR-CON) 8 MEQ tablet Take 8 mEq by mouth daily.  04/26/14  Yes [provider]  predniSONE (DELTASONE) 5 MG tablet Take 5 mg by mouth 2 (two) times daily with a meal.    Yes [provider]  pyridOXINE (VITAMIN B-6) 100 MG tablet Take 100 mg by mouth once a week. On Thursday   Yes [provider]  vitamin B-12 (CYANOCOBALAMIN) 500 MCG tablet Take 500 mcg by mouth once a week. On Thursdays   Yes [provider]    Family History Family History  Problem Relation Age of Onset  . Diabetes Mother   . Heart disease Brother   . Diabetes Sister   . Diabetes Sister     Social History Social History   Tobacco Use  . Smoking status: Never Smoker  . Smokeless tobacco: Never Used  Substance Use Topics  . Alcohol use: No  . Drug use: No     Allergies   Accutane [isotretinoin];  Ciprofloxacin; Erythromycin; Gluten; Gluten meal; Isotretinoin; Lipitor [atorvastatin]; Lisinopril; Lovastatin; Penicillins; Phenytoin sodium extended; Pravastatin; Crestor [rosuvastatin]; Gabapentin; Metoprolol; Prolia [denosumab]; Boniva [ibandronic acid]; Clarithromycin; Hydralazine; Lescol [fluvastatin sodium]; Sulfa antibiotics; Sulfasalazine; Welchol [colesevelam hcl]; Zetia [ezetimibe]; and Zithromax [azithromycin]   Review of Systems Review of Systems  Constitutional: Negative for chills, diaphoresis, fatigue and fever.  HENT: Negative for congestion, rhinorrhea and sneezing.   Eyes: Negative.   Respiratory: Negative for cough, chest tightness and shortness of  breath.   Cardiovascular: Negative for chest pain and leg swelling.  Gastrointestinal: Positive for abdominal pain, diarrhea, nausea and vomiting. Negative for blood in stool.  Genitourinary: Negative for difficulty urinating, flank pain, frequency and hematuria.  Musculoskeletal: Negative for arthralgias and back pain.  Skin: Negative for rash.  Neurological: Negative for dizziness, speech difficulty, weakness, numbness and headaches.     Physical Exam Updated Vital Signs BP (!) 191/64   Pulse 78   Temp 97.6 F (36.4 C) (Oral)   Resp 18   SpO2 95%   Physical Exam  Constitutional: She is oriented to person, place, and time. She appears well-developed and well-nourished. She appears distressed.  Patient appears uncomfortable  HENT:  Head: Normocephalic and atraumatic.  Eyes: Pupils are equal, round, and reactive to light.  Neck: Normal range of motion. Neck supple.  Cardiovascular: Normal rate, regular rhythm and normal heart sounds.  Pulmonary/Chest: Effort normal and breath sounds normal. No respiratory distress. She has no wheezes. She has no rales. She exhibits no tenderness.  Abdominal: Soft. Bowel sounds are normal. There is tenderness in the right upper quadrant, epigastric area and left upper quadrant. There  is no rebound and no guarding.  Musculoskeletal: Normal range of motion. She exhibits no edema.  Lymphadenopathy:    She has no cervical adenopathy.  Neurological: She is alert and oriented to person, place, and time.  Skin: Skin is warm and dry. No rash noted.  Psychiatric: She has a normal mood and affect.     ED Treatments / Results  Labs (all labs ordered are listed, but only abnormal results are displayed) Labs Reviewed  COMPREHENSIVE METABOLIC PANEL - Abnormal; Notable for the following components:      Result Value   Potassium 3.4 (*)    Glucose, Bld 178 (*)    Creatinine, Ser 1.44 (*)    Total Bilirubin 1.5 (*)    GFR calc non Af Amer 32 (*)    GFR calc Af Amer 37 (*)    All other components within normal limits  CBC - Abnormal; Notable for the following components:   WBC 13.6 (*)    RBC 5.34 (*)    MCV 76.2 (*)    MCH 24.0 (*)    RDW 19.2 (*)    All other components within normal limits  URINALYSIS, ROUTINE W REFLEX MICROSCOPIC - Abnormal; Notable for the following components:   APPearance HAZY (*)    Hgb urine dipstick SMALL (*)    Leukocytes, UA SMALL (*)    Bacteria, UA RARE (*)    All other components within normal limits  LIPASE, BLOOD    EKG None  Radiology Ct Renal Stone Study  Result Date: 12/13/2017 CLINICAL DATA:  Right-sided abdominal pain and vomiting for the past 5 days. EXAM: CT ABDOMEN AND PELVIS WITHOUT CONTRAST TECHNIQUE: Multidetector CT imaging of the abdomen and pelvis was performed following the standard protocol without IV contrast. COMPARISON:  CT abdomen pelvis dated January 13, 2015. FINDINGS: Lower chest: No acute abnormality. Hepatobiliary: No focal liver abnormality is seen. No gallstones, gallbladder Tomb thickening, or biliary dilatation. Pancreas: Mild atrophy. No ductal dilatation or surrounding inflammatory changes. Spleen: No splenic injury or perisplenic hematoma. Adrenals/Urinary Tract: The adrenal glands are unremarkable.  Moderate to severe left and mild right cortical atrophy. Unchanged small bilateral renal cysts. No renal or ureteral calculi. Mild right hydroureteronephrosis to the level of the distal ureter. No obstructing stone or lesion. Stomach/Bowel: Stomach is within normal limits. Appendix  appears normal. No evidence of bowel [place] thickening, distention, or inflammatory changes. Extensive sigmoid diverticulosis. Vascular/Lymphatic: Aortic atherosclerosis. No enlarged abdominal or pelvic lymph nodes. Reproductive: Status post hysterectomy. No adnexal masses. Other: No free fluid or pneumoperitoneum. Musculoskeletal: No acute or significant osseous findings. Unchanged chronic mild L1 compression fracture. Stable severe degenerative changes of the lumbar spine. IMPRESSION: 1. Mild right hydroureteronephrosis to the level of the distal ureter without obstructing stone or lesion. 2. Sigmoid diverticulosis.  No evidence of acute diverticulitis. 3.  Aortic atherosclerosis (ICD10-I70.0). Electronically Signed   By: Titus Dubin M.D.   On: 12/13/2017 17:29    Procedures Procedures (including critical care time)  Medications Ordered in ED Medications  fentaNYL (SUBLIMAZE) injection 50 mcg (50 mcg Intravenous Given 12/13/17 1624)  ondansetron (ZOFRAN) injection 4 mg (4 mg Intravenous Given 12/13/17 1624)  sodium chloride 0.9 % bolus 500 mL (0 mLs Intravenous Stopped 12/13/17 1733)  morphine 4 MG/ML injection 4 mg (4 mg Intravenous Given 12/13/17 1703)  HYDROmorphone (DILAUDID) injection 0.5 mg (0.5 mg Intravenous Given 12/13/17 1812)  ondansetron (ZOFRAN) injection 4 mg (4 mg Intravenous Given 12/13/17 1812)     Initial Impression / Assessment and Plan / ED Course  I have reviewed the triage vital signs and the nursing notes.  Pertinent labs & imaging results that were available during my care of the patient were reviewed by me and considered in my medical decision making (see chart for details).     Patient is a  84 year old female who presents with right-sided abdominal pain.  CT scan shows evidence of moderate hydronephrosis with a junction point at the mid ureter.  There is no evidence of a kidney stone.  She does have some mild hematuria on urinalysis but no signs of infection.  She was given, morphine and Dilaudid in the ED with ongoing pain.  I consulted Dr. Diona Fanti with urology who will admit the patient for pain control and possible stent placement.  Final Clinical Impressions(s) / ED Diagnoses   Final diagnoses:  Hydronephrosis, unspecified hydronephrosis type    ED Discharge Orders    None       Malvin Johns, MD 12/13/17 1850

## 2017-12-14 ENCOUNTER — Inpatient Hospital Stay (HOSPITAL_COMMUNITY): Payer: Medicare Other | Admitting: Certified Registered Nurse Anesthetist

## 2017-12-14 ENCOUNTER — Encounter (HOSPITAL_COMMUNITY)
Admission: EM | Disposition: A | Discharge status: Home / Self Care | Payer: Self-pay | Source: Home / Self Care | Attending: Urology

## 2017-12-14 ENCOUNTER — Inpatient Hospital Stay (HOSPITAL_COMMUNITY): Payer: Medicare Other

## 2017-12-14 ENCOUNTER — Encounter (HOSPITAL_COMMUNITY): Payer: Self-pay | Admitting: Anesthesiology

## 2017-12-14 DIAGNOSIS — D4959 Neoplasm of unspecified behavior of other genitourinary organ: Secondary | ICD-10-CM | POA: Diagnosis not present

## 2017-12-14 DIAGNOSIS — N132 Hydronephrosis with renal and ureteral calculous obstruction: Secondary | ICD-10-CM | POA: Diagnosis not present

## 2017-12-14 HISTORY — PX: CYSTOSCOPY WITH RETROGRADE PYELOGRAM, URETEROSCOPY AND STENT PLACEMENT: SHX5789

## 2017-12-14 LAB — GLUCOSE, CAPILLARY: GLUCOSE-CAPILLARY: 106 mg/dL — AB (ref 70–99)

## 2017-12-14 LAB — SURGICAL PCR SCREEN
MRSA, PCR: NEGATIVE
Staphylococcus aureus: NEGATIVE

## 2017-12-14 SURGERY — CYSTOURETEROSCOPY, WITH RETROGRADE PYELOGRAM AND STENT INSERTION
Anesthesia: General | Site: Ureter | Laterality: Right

## 2017-12-14 MED ORDER — SODIUM CHLORIDE 0.9 % IV SOLN
INTRAVENOUS | Status: DC | PRN
  Administered 2017-12-14: 08:00:00 via INTRAVENOUS

## 2017-12-14 MED ORDER — FENTANYL CITRATE (PF) 100 MCG/2ML IJ SOLN
INTRAMUSCULAR | Status: AC
  Filled 2017-12-14: qty 2

## 2017-12-14 MED ORDER — SODIUM CHLORIDE 0.45 % IV SOLN
INTRAVENOUS | Status: DC
  Administered 2017-12-14: 11:00:00 via INTRAVENOUS

## 2017-12-14 MED ORDER — LIDOCAINE 2% (20 MG/ML) 5 ML SYRINGE
INTRAMUSCULAR | Status: AC
Start: 2017-12-14 — End: ?
  Filled 2017-12-14: qty 5

## 2017-12-14 MED ORDER — ONDANSETRON HCL 4 MG/2ML IJ SOLN
INTRAMUSCULAR | Status: DC | PRN
  Administered 2017-12-14: 4 mg via INTRAVENOUS

## 2017-12-14 MED ORDER — PHENYLEPHRINE HCL 10 MG/ML IJ SOLN
INTRAMUSCULAR | Status: AC
  Filled 2017-12-14: qty 1

## 2017-12-14 MED ORDER — CEPHALEXIN 250 MG PO CAPS
250.0000 mg | ORAL_CAPSULE | Freq: Three times a day (TID) | ORAL | Status: DC
  Administered 2017-12-14 – 2017-12-15 (×3): 250 mg via ORAL
  Filled 2017-12-14 (×5): qty 1

## 2017-12-14 MED ORDER — FENTANYL CITRATE (PF) 100 MCG/2ML IJ SOLN
INTRAMUSCULAR | Status: DC | PRN
  Administered 2017-12-14 (×2): 25 ug via INTRAVENOUS

## 2017-12-14 MED ORDER — FENTANYL CITRATE (PF) 100 MCG/2ML IJ SOLN: 25.0000 ug | INTRAMUSCULAR | Status: DC | PRN

## 2017-12-14 MED ORDER — IOHEXOL 300 MG/ML  SOLN
INTRAMUSCULAR | Status: DC | PRN
  Administered 2017-12-14: 16 mL via URETHRAL

## 2017-12-14 MED ORDER — ONDANSETRON HCL 4 MG/2ML IJ SOLN
INTRAMUSCULAR | Status: AC
  Filled 2017-12-14: qty 2

## 2017-12-14 MED ORDER — SODIUM CHLORIDE 0.9 % IV SOLN: INTRAVENOUS | Status: DC

## 2017-12-14 MED ORDER — PROPOFOL 10 MG/ML IV BOLUS
INTRAVENOUS | Status: AC
  Filled 2017-12-14: qty 20

## 2017-12-14 MED ORDER — LIDOCAINE 2% (20 MG/ML) 5 ML SYRINGE
INTRAMUSCULAR | Status: DC | PRN
  Administered 2017-12-14: 70 mg via INTRAVENOUS

## 2017-12-14 MED ORDER — PROPOFOL 10 MG/ML IV BOLUS
INTRAVENOUS | Status: DC | PRN
  Administered 2017-12-14: 100 mg via INTRAVENOUS

## 2017-12-14 MED ORDER — SODIUM CHLORIDE 0.9 % IR SOLN
Status: DC | PRN
  Administered 2017-12-14: 3000 mL

## 2017-12-14 MED ORDER — PHENYLEPHRINE HCL 10 MG/ML IJ SOLN
INTRAMUSCULAR | Status: DC | PRN
  Administered 2017-12-14: 40 ug/min via INTRAVENOUS

## 2017-12-14 SURGICAL SUPPLY — 25 items
BAG URO CATCHER STRL LF (MISCELLANEOUS) ×3 IMPLANT
BASKET LASER NITINOL 1.9FR (BASKET) ×1 IMPLANT
BASKET ZERO TIP NITINOL 2.4FR (BASKET) IMPLANT
BSKT STON RTRVL 120 1.9FR (BASKET)
BSKT STON RTRVL ZERO TP 2.4FR (BASKET)
CATH INTERMIT  6FR 70CM (CATHETERS) ×2 IMPLANT
CLOTH BEACON ORANGE TIMEOUT ST (SAFETY) ×3 IMPLANT
COVER FOOTSWITCH UNIV (MISCELLANEOUS) IMPLANT
COVER SURGICAL LIGHT HANDLE (MISCELLANEOUS) ×1 IMPLANT
FIBER LASER FLEXIVA 365 (UROLOGICAL SUPPLIES) IMPLANT
FIBER LASER TRAC TIP (UROLOGICAL SUPPLIES) IMPLANT
GLOVE BIOGEL M 8.0 STRL (GLOVE) ×3 IMPLANT
GOWN STRL REUS W/ TWL XL LVL3 (GOWN DISPOSABLE) ×1 IMPLANT
GOWN STRL REUS W/TWL LRG LVL3 (GOWN DISPOSABLE) ×6 IMPLANT
GOWN STRL REUS W/TWL XL LVL3 (GOWN DISPOSABLE) ×3
GUIDEWIRE ANG ZIPWIRE 038X150 (WIRE) ×1 IMPLANT
GUIDEWIRE STR DUAL SENSOR (WIRE) ×3 IMPLANT
IV NS 1000ML (IV SOLUTION) ×3
IV NS 1000ML BAXH (IV SOLUTION) ×1 IMPLANT
MANIFOLD NEPTUNE II (INSTRUMENTS) ×3 IMPLANT
PACK CYSTO (CUSTOM PROCEDURE TRAY) ×3 IMPLANT
STENT URET 6FRX24 CONTOUR (STENTS) ×2 IMPLANT
TUBING CONNECTING 10 (TUBING) ×2 IMPLANT
TUBING CONNECTING 10' (TUBING) ×1
TUBING UROLOGY SET (TUBING) ×3 IMPLANT

## 2017-12-14 NOTE — Transfer of Care (Signed)
Immediate Anesthesia Transfer of Care Note  Patient: [name]  Procedure(s) Performed: CYSTOSCOPY WITH RIGHT RETROGRADE PYELOGRAM, RIGHT URETEROSCOPY AND RIGHT URETERAL STENT PLACEMENT, WASHINGS (Right Ureter)  Patient Location: PACU  Anesthesia Type:General  Level of Consciousness: drowsy and patient cooperative  Airway & Oxygen Therapy: Patient Spontanous Breathing and Patient connected to face mask oxygen  Post-op Assessment: Report given to RN and Post -op Vital signs reviewed and stable  Post vital signs: Reviewed and stable  Last Vitals:  Vitals Value Taken Time  BP 180/65 12/14/2017  9:38 AM  Temp    Pulse 70 12/14/2017  9:40 AM  Resp 11 12/14/2017  9:40 AM  SpO2 100 % 12/14/2017  9:40 AM  Vitals shown include unvalidated device data.  Last Pain:  Vitals:   12/14/17 0542  TempSrc:   PainSc: 3       Patients Stated Pain Goal: 3 (25/75/05 1833)  Complications: No apparent anesthesia complications

## 2017-12-14 NOTE — Anesthesia Procedure Notes (Signed)
Procedure Name: LMA Insertion Date/Time: 12/14/2017 9:02 AM Performed by: Montel Clock, CRNA Pre-anesthesia Checklist: Patient identified, Emergency Drugs available, Suction available, Patient being monitored and Timeout performed Patient Re-evaluated:Patient Re-evaluated prior to induction Oxygen Delivery Method: Circle system utilized Preoxygenation: Pre-oxygenation with 100% oxygen Induction Type: IV induction Ventilation: Mask ventilation without difficulty LMA: LMA with gastric port inserted LMA Size: 4.0 Number of attempts: 1 Dental Injury: Teeth and Oropharynx as per pre-operative assessment

## 2017-12-14 NOTE — Anesthesia Postprocedure Evaluation (Signed)
Anesthesia Post Note  Patient: [name]  Procedure(s) Performed: CYSTOSCOPY WITH RIGHT RETROGRADE PYELOGRAM, RIGHT URETEROSCOPY AND RIGHT URETERAL STENT PLACEMENT, WASHINGS (Right Ureter)     Patient location during evaluation: PACU Anesthesia Type: General Level of consciousness: awake and alert Pain management: pain level controlled Vital Signs Assessment: post-procedure vital signs reviewed and stable Respiratory status: spontaneous breathing, nonlabored ventilation, respiratory function stable and patient connected to nasal cannula oxygen Cardiovascular status: blood pressure returned to baseline and stable Postop Assessment: no apparent nausea or vomiting Anesthetic complications: no    Last Vitals:  Vitals:   12/14/17 1025 12/14/17 1039  BP: (!) 165/63 (!) 171/63  Pulse: 69 71  Resp: 14 10  Temp: 37.2 C   SpO2: 97% 97%    Last Pain:  Vitals:   12/14/17 1123  TempSrc:   PainSc: Asleep                 Effie Berkshire

## 2017-12-14 NOTE — Progress Notes (Signed)
Pt feeling some better.Less pain.  I reviewed procedure with pt--she expressed understanding.

## 2017-12-14 NOTE — Anesthesia Preprocedure Evaluation (Addendum)
Anesthesia Evaluation  Patient identified by MRN, date of birth, ID band Patient awake    Reviewed: Allergy & Precautions, NPO status , Patient's Chart, lab work & pertinent test results  Airway Mallampati: II  TM Distance: >3 FB Neck ROM: Full    Dental  (+) Teeth Intact, Dental Advisory Given   Pulmonary pneumonia,    breath sounds clear to auscultation       Cardiovascular hypertension, Pt. on medications and Pt. on home beta blockers + CAD and + Peripheral Vascular Disease  + Valvular Problems/Murmurs  Rhythm:Regular Rate:Normal     Neuro/Psych negative neurological ROS  negative psych ROS   GI/Hepatic Neg liver ROS, GERD  Medicated,  Endo/Other  diabetes, Type 2  Renal/GU Renal disease     Musculoskeletal   Abdominal (+) + obese,   Peds  Hematology negative hematology ROS (+)   Anesthesia Other Findings   Reproductive/Obstetrics                            Lab Results  Component Value Date   WBC 13.6 (H) 12/13/2017   HGB 12.8 12/13/2017   HCT 40.7 12/13/2017   MCV 76.2 (L) 12/13/2017   PLT 322 12/13/2017   Lab Results  Component Value Date   CREATININE 1.44 (H) 12/13/2017   BUN 18 12/13/2017   NA 140 12/13/2017   K 3.4 (L) 12/13/2017   CL 98 12/13/2017   CO2 31 12/13/2017   Lab Results  Component Value Date   INR 1.0 11/22/2008   INR 1.2 07/21/2008   INR 1.5 04/02/2008   EKG: sinus rhythm.  Anesthesia Physical Anesthesia Plan  ASA: III  Anesthesia Plan: General   Post-op Pain Management:    Induction: Intravenous  PONV Risk Score and Plan: 4 or greater and Ondansetron and Treatment may vary due to age or medical condition  Airway Management Planned: LMA  Additional Equipment: None  Intra-op Plan:   Post-operative Plan: Extubation in OR  Informed Consent: I have reviewed the patients History and Physical, chart, labs and discussed the procedure  including the risks, benefits and alternatives for the proposed anesthesia with the patient or authorized representative who has indicated his/her understanding and acceptance.   Dental advisory given  Plan Discussed with: CRNA  Anesthesia Plan Comments:         Anesthesia Quick Evaluation

## 2017-12-14 NOTE — Op Note (Signed)
Preoperative diagnosis: Right ureteral obstruction with hydronephrosis  Postoperative diagnosis: Right ureteral obstruction, distal ureteral tumor with hydronephrosis  Principal procedure: Cystoscopy, right retrograde ureteropyelogram with fluoroscopic interpretation, right ureteroscopy, washings from right renal pelvis, right double-J stent placement (6 [place] by 24 cm contour without tether)  Surgeon: Shareef Eddinger  Anesthesia: General with LMA  Complications: None  Specimen: Right renal washings  Drains: Above mentioned stent  Indications: 84 year old female with recent presentation to the emergency room with a several day history of right flank pain, nausea and vomiting.  CT stone protocol was performed which revealed distal ureteral narrowing, proximal hydroureteronephrosis.  There was significant left renal atrophy and no evidence of extra ureteral or extravesical abnormality save for atherosclerosis and diverticulosis of the colon.  She was admitted for pain management, and presents at this time for cystoscopy, right retrograde, ureteroscopy and possible stent placement.  The procedure as well as risks and complications have been discussed with the patient and her daughter, [name] who presented with her to the emergency room last night.  They understand these and desire to proceed.  Findings: Bladder appeared normal with the exception of a small amount of blood layering posteriorly.  No bladder tumors were noted.  Both ureteral orifices were normal in configuration location.  Retrograde pyelogram on the right revealed mild dilatation of the distal ureter with a significant ureteral narrowing  approximately 5 cm up the ureter.  There was no significant amount of contrast that was able to be injected past this.  Following retrograde, ureteroscopy revealed a polypoid tumor, approximately 2 cm long, and the distal ureter.  I was able to negotiate past this.  There was no proximal ureteral urothelial  abnormality.  The renal pelvis was entered with the rigid scope and no urothelial abnormalities were seen there either.  Description of procedure: The patient was properly identified in the holding area.  The right side was marked.  She was taken to the operating room where general anesthetic was administered with the LMA.  She was placed in the dorsolithotomy position.  Genitalia and perineum were prepped and draped.  Proper timeout was performed.  80 French panendoscope was advanced into the bladder with the above-mentioned bladder findings.  Retrograde ureteropyelogram then performed with Omnipaque using a 6 [place] open-ended catheter.  Again, above-mentioned findings were noted.  Following retrograde study, guidewire was easily passed into the upper pole calyceal system using fluoroscopic guidance.  I then negotiated a 6 [place] dual-lumen semirigid ureteroscope into the bladder and up into the ureter where I encountered a polypoid tumor which filled the entire ureteral width.  It was papillary appearing.  I was able to negotiate the scope easily passed this.  This tumor was approximately 2 cm in length.  Following passage of this tumor, it was negotiated proximally where no further ureteral abnormalities were noted.  Once in the renal pelvis, washings were taken and sent for cytology.  The scope was then removed, with inspection performed during removal.  Again, previously noted ureteral findings were confirmed.  Once the scope was removed, the guidewire was backloaded through the cystoscope and a 24 cm x 6 [place] contour double-J stent was passed.  Excellent proximal and distal curls were seen after removal of the guidewire.  The bladder was then drained.  The scope was removed and the procedure terminated.  The patient was then awakened and taken to the PACU in stable condition, having tolerated the procedure well.

## 2017-12-14 NOTE — Progress Notes (Signed)
At 4pm patient had not voided since returning from OR. She stated she was unable to void but had an urge. Pure wick in place which she was using successfully prior to surgery today. Bladder scan showed 250 cc bladder volume. Patient c/o pain in right flank. Pain and nausea medicine provided. MD made aware of inability to void. Instructions received.  Patient assisted to get out of bed to bedside commode. She was able to void 100cc bright red urine. Patient also had episode of nausea and vomited. Assisted back to bed. Will continue to monitor output.

## 2017-12-15 ENCOUNTER — Encounter (HOSPITAL_COMMUNITY): Payer: Self-pay | Admitting: Urology

## 2017-12-15 LAB — URINE CULTURE: CULTURE: NO GROWTH

## 2017-12-15 MED ORDER — HYDROCODONE-ACETAMINOPHEN 5-325 MG PO TABS
1.0000 | ORAL_TABLET | Freq: Once | ORAL | Status: AC
  Administered 2017-12-15: 1 via ORAL
  Filled 2017-12-15: qty 1

## 2017-12-15 MED ORDER — CEPHALEXIN 500 MG PO CAPS: 500.0000 mg | ORAL_CAPSULE | Freq: Two times a day (BID) | ORAL | 0 refills | Status: DC

## 2017-12-15 NOTE — Discharge Summary (Signed)
Patient ID: [name] MRN: [id number] DOB/AGE: [birth year] 82 y.o.  Admit date: 12/13/2017 Discharge date: 12/15/2017  Primary Care Physician:  Hulan Fess, MD  Discharge Diagnoses: Tumor of right ureter, hydronephrosis Present on Admission: . Hydronephrosis   Consults: None   Discharge Medications: Allergies as of 12/15/2017      Reactions   Accutane [isotretinoin] Other (See Comments)   Affected liver   Ciprofloxacin Rash, Other (See Comments)   Kidney failure   Erythromycin Diarrhea, Rash   Gluten Diarrhea, Other (See Comments)   Celiac sprue   Gluten Meal Diarrhea, Rash   Celiac sprue intolerable   Isotretinoin Other (See Comments)   Affects liver   Lipitor [atorvastatin] Other (See Comments)   Affects legs   Lisinopril Other (See Comments)   Pt doesn't remember reaction   Lovastatin Other (See Comments)   Affects legs   Penicillins Other (See Comments)   From childhood   Phenytoin Sodium Extended Other (See Comments)   Affects liver   Pravastatin Other (See Comments)   Affected legs   Crestor [rosuvastatin]    Muscle aches even on 5mg  once weekly   Gabapentin Other (See Comments)   Shaky, swollen legs   Metoprolol Itching, Swelling   Prolia [denosumab] Other (See Comments)   Joints and muscle aches, loss of hair   Boniva [ibandronic Acid] Other (See Comments)   headaches   Clarithromycin Rash   Hydralazine Rash   Pt does not know what the reactions were.   Lescol [fluvastatin Sodium] Other (See Comments)   Affects legs   Sulfa Antibiotics Rash   Sulfasalazine Rash   Welchol [colesevelam Hcl] Other (See Comments)   No energy   Zetia [ezetimibe] Other (See Comments)   myalgias   Zithromax [azithromycin] Rash      Medication List    TAKE these medications   amLODipine 10 MG tablet Commonly known as:  NORVASC Take 1 tablet (10 mg total) by mouth daily.   aspirin EC 81 MG tablet Take 81 mg by mouth daily at 3 pm.   azelastine 0.1 % nasal  spray Commonly known as:  ASTELIN Place 1 spray into both nostrils 2 (two) times daily. Use in each nostril as directed   Biotin 5000 MCG Tabs Take 5,000 mcg by mouth once a week.   carvedilol 3.125 MG tablet Commonly known as:  COREG Take 3.125 mg by mouth 2 (two) times daily.   cephALEXin 500 MG capsule Commonly known as:  KEFLEX Take 1 capsule (500 mg total) by mouth 2 (two) times daily. What changed:  when to take this   Cholecalciferol 4000 units Caps Take 4,000 Units by mouth daily at 3 pm.   CITRACAL PO Take 1 tablet by mouth daily at 3 pm.   cloNIDine 0.1 MG tablet Commonly known as:  CATAPRES Take 0.1 mg by mouth 2 (two) times daily.   dextromethorphan-guaiFENesin 30-600 MG 12hr tablet Commonly known as:  MUCINEX DM Take 1 tablet by mouth 2 (two) times daily.   furosemide 40 MG tablet Commonly known as:  LASIX Take 1 tablet (40 mg total) by mouth daily. Morning and mid evening What changed:    when to take this  additional instructions   irbesartan 300 MG tablet Commonly known as:  AVAPRO Take 300 mg by mouth daily.   Levothyroxine Sodium 50 MCG Caps Take 1 capsule by mouth daily.   METRONIDAZOLE (TOPICAL) 0.75 % Lotn Apply 1 application topically at bedtime as needed (rosacea).  metroNIDAZOLE 500 MG tablet Commonly known as:  FLAGYL Take 500 mg by mouth 3 (three) times daily. for 7 days   multivitamin with minerals Tabs tablet Take 1 tablet by mouth daily at 3 pm.   pantoprazole 40 MG tablet Commonly known as:  PROTONIX Take 40 mg by mouth daily before breakfast.   potassium chloride 8 MEQ tablet Commonly known as:  KLOR-CON Take 8 mEq by mouth daily.   predniSONE 5 MG tablet Commonly known as:  DELTASONE Take 5 mg by mouth 2 (two) times daily with a meal.   pyridOXINE 100 MG tablet Commonly known as:  VITAMIN B-6 Take 100 mg by mouth once a week. On Thursday   vitamin B-12 500 MCG tablet Commonly known as:  CYANOCOBALAMIN Take 500  mcg by mouth once a week. On Thursdays   VITAMIN C PO Take 1 tablet by mouth daily at 3 pm.        Significant Diagnostic Studies:  Dg C-arm 1-60 Min-no Report  Result Date: 12/14/2017 Fluoroscopy was utilized by the requesting physician.  No radiographic interpretation.   Ct Renal Stone Study  Result Date: 12/13/2017 CLINICAL DATA:  Right-sided abdominal pain and vomiting for the past 5 days. EXAM: CT ABDOMEN AND PELVIS WITHOUT CONTRAST TECHNIQUE: Multidetector CT imaging of the abdomen and pelvis was performed following the standard protocol without IV contrast. COMPARISON:  CT abdomen pelvis dated January 13, 2015. FINDINGS: Lower chest: No acute abnormality. Hepatobiliary: No focal liver abnormality is seen. No gallstones, gallbladder Cull thickening, or biliary dilatation. Pancreas: Mild atrophy. No ductal dilatation or surrounding inflammatory changes. Spleen: No splenic injury or perisplenic hematoma. Adrenals/Urinary Tract: The adrenal glands are unremarkable. Moderate to severe left and mild right cortical atrophy. Unchanged small bilateral renal cysts. No renal or ureteral calculi. Mild right hydroureteronephrosis to the level of the distal ureter. No obstructing stone or lesion. Stomach/Bowel: Stomach is within normal limits. Appendix appears normal. No evidence of bowel Gloor thickening, distention, or inflammatory changes. Extensive sigmoid diverticulosis. Vascular/Lymphatic: Aortic atherosclerosis. No enlarged abdominal or pelvic lymph nodes. Reproductive: Status post hysterectomy. No adnexal masses. Other: No free fluid or pneumoperitoneum. Musculoskeletal: No acute or significant osseous findings. Unchanged chronic mild L1 compression fracture. Stable severe degenerative changes of the lumbar spine. IMPRESSION: 1. Mild right hydroureteronephrosis to the level of the distal ureter without obstructing stone or lesion. 2. Sigmoid diverticulosis.  No evidence of acute diverticulitis. 3.   Aortic atherosclerosis (ICD10-I70.0). Electronically Signed   By: Titus Dubin M.D.   On: 12/13/2017 17:29    Brief H and P: For complete details please refer to admission H and P, but in brief the patient was admitted for management of right hydronephrosis with significant flank pain.  Hospital Course: The patient was admitted  for pain management and hydration.  On second hospital day she underwent cystoscopy, retrograde and ureteroscopy.  This revealed a papillary tumor in her right distal ureter with proximal hydroureteronephrosis.  This was stented.  She had an uncomplicated recovery after that and was discharged on hospital day 2 Active Problems:   Hydronephrosis   Day of Discharge BP (!) 171/60 (BP Location: Right Arm)   Pulse 66   Temp 98.3 F (36.8 C) (Oral)   Resp 16   Ht 5\' 4"  (1.626 m)   Wt 85.7 kg (188 lb 14.9 oz)   SpO2 95%   BMI 32.43 kg/m   Results for orders placed or performed during the hospital encounter of 12/13/17 (from the past  24 hour(s))  Glucose, capillary     Status: Abnormal   Collection Time: 12/14/17  9:52 AM  Result Value Ref Range   Glucose-Capillary 106 (H) 70 - 99 mg/dL    Physical Exam: General: Alert and awake oriented x3 not in any acute distress. HEENT: anicteric sclera, pupils reactive to light and accommodation CVS: S1-S2 clear no murmur rubs or gallops Chest: clear to auscultation bilaterally, no wheezing rales or rhonchi Abdomen: soft nontender, nondistended, normal bowel sounds, no organomegaly Extremities: no cyanosis, clubbing or edema noted bilaterally Neuro: Cranial nerves II-XII intact, no focal neurological deficits  Disposition: Home   Diet:  regular   Disposition and Follow-up:    We will arrange followup  TESTS THAT NEED FOLLOW-UP  N/A  DISCHARGE FOLLOW-UP Follow-up Information    Franchot Gallo, MD Follow up.   Specialty:  Urology Why:  We will call you to set up appointment Contact information: [place] [address] [phone]           Time spent on Discharge:  15 mins  Signed: Lillette Boxer Arlie Riker 12/15/2017, 8:12 AM

## 2017-12-15 NOTE — Discharge Instructions (Signed)
1. You may see some blood in the urine and may have some burning with urination for 48-72 hours. You also may notice that you have to urinate more frequently or urgently after your procedure which is normal.  2. You should call should you develop an inability urinate, fever > 101, persistent nausea and vomiting that prevents you from eating or drinking to stay hydrated.  3. If you have a stent, you will likely urinate more frequently and urgently until the stent is removed and you may experience some discomfort/pain in the lower abdomen and flank especially when urinating. You may take pain medication prescribed to you if needed for pain. You may also intermittently have blood in the urine until the stent is removed. 4. Continue cephalexin, only take it twice a day until completed

## 2017-12-15 NOTE — Progress Notes (Addendum)
Patient is stable for discharge. Discharge instructions and medications have been reviewed with the patient and her daughter and all questions answered. AVS given to patient's daughter. Daughter made aware that Dr. Diona Fanti was going to call in a prescription for Tramadol for pain to the [place] that is on file.   [name] [facility] 12/15/2017

## 2017-12-16 LAB — GLUCOSE, CAPILLARY: Glucose-Capillary: 98 mg/dL (ref 70–99)

## 2017-12-20 DIAGNOSIS — R1084 Generalized abdominal pain: Secondary | ICD-10-CM | POA: Diagnosis not present

## 2017-12-20 DIAGNOSIS — N130 Hydronephrosis with ureteropelvic junction obstruction: Secondary | ICD-10-CM | POA: Diagnosis not present

## 2017-12-25 ENCOUNTER — Other Ambulatory Visit: Payer: Self-pay | Admitting: Gastroenterology

## 2017-12-25 ENCOUNTER — Ambulatory Visit
Admission: RE | Admit: 2017-12-25 | Discharge: 2017-12-25 | Disposition: A | Discharge status: Home / Self Care | Payer: Medicare Other | Source: Ambulatory Visit | Attending: Gastroenterology | Admitting: Gastroenterology

## 2017-12-25 DIAGNOSIS — G8929 Other chronic pain: Secondary | ICD-10-CM

## 2017-12-25 DIAGNOSIS — R1031 Right lower quadrant pain: Secondary | ICD-10-CM | POA: Diagnosis not present

## 2017-12-26 DIAGNOSIS — R1031 Right lower quadrant pain: Secondary | ICD-10-CM | POA: Diagnosis not present

## 2017-12-31 DIAGNOSIS — L603 Nail dystrophy: Secondary | ICD-10-CM | POA: Diagnosis not present

## 2017-12-31 DIAGNOSIS — I739 Peripheral vascular disease, unspecified: Secondary | ICD-10-CM | POA: Diagnosis not present

## 2017-12-31 DIAGNOSIS — E1151 Type 2 diabetes mellitus with diabetic peripheral angiopathy without gangrene: Secondary | ICD-10-CM | POA: Diagnosis not present

## 2017-12-31 DIAGNOSIS — L84 Corns and callosities: Secondary | ICD-10-CM | POA: Diagnosis not present

## 2018-01-01 ENCOUNTER — Inpatient Hospital Stay: Payer: Medicare Other | Attending: Oncology

## 2018-01-01 DIAGNOSIS — M353 Polymyalgia rheumatica: Secondary | ICD-10-CM | POA: Insufficient documentation

## 2018-01-01 DIAGNOSIS — Z79899 Other long term (current) drug therapy: Secondary | ICD-10-CM | POA: Insufficient documentation

## 2018-01-01 DIAGNOSIS — R109 Unspecified abdominal pain: Secondary | ICD-10-CM | POA: Insufficient documentation

## 2018-01-01 DIAGNOSIS — Z7982 Long term (current) use of aspirin: Secondary | ICD-10-CM | POA: Insufficient documentation

## 2018-01-01 DIAGNOSIS — N1330 Unspecified hydronephrosis: Secondary | ICD-10-CM | POA: Insufficient documentation

## 2018-01-01 DIAGNOSIS — D72829 Elevated white blood cell count, unspecified: Secondary | ICD-10-CM | POA: Diagnosis not present

## 2018-01-01 DIAGNOSIS — D472 Monoclonal gammopathy: Secondary | ICD-10-CM

## 2018-01-01 LAB — CBC WITH DIFFERENTIAL (CANCER CENTER ONLY)
BASOS ABS: 0.2 10*3/uL — AB (ref 0.0–0.1)
Basophils Relative: 1 %
EOS ABS: 0.2 10*3/uL (ref 0.0–0.5)
EOS PCT: 1 %
HCT: 35.3 % (ref 34.8–46.6)
Hemoglobin: 11.4 g/dL — ABNORMAL LOW (ref 11.6–15.9)
Lymphocytes Relative: 58 %
Lymphs Abs: 8.4 10*3/uL — ABNORMAL HIGH (ref 0.9–3.3)
MCH: 23.7 pg — AB (ref 25.1–34.0)
MCHC: 32.2 g/dL (ref 31.5–36.0)
MCV: 73.7 fL — ABNORMAL LOW (ref 79.5–101.0)
MONO ABS: 0.7 10*3/uL (ref 0.1–0.9)
Monocytes Relative: 5 %
Neutro Abs: 5.2 10*3/uL (ref 1.5–6.5)
Neutrophils Relative %: 35 %
Platelet Count: 608 10*3/uL — ABNORMAL HIGH (ref 145–400)
RBC: 4.79 MIL/uL (ref 3.70–5.45)
RDW: 20.3 % — AB (ref 11.2–14.5)
WBC: 14.6 10*3/uL — AB (ref 3.9–10.3)

## 2018-01-01 LAB — CMP (CANCER CENTER ONLY)
ALT: 14 U/L (ref 0–44)
AST: 15 U/L (ref 15–41)
Albumin: 3.2 g/dL — ABNORMAL LOW (ref 3.5–5.0)
Alkaline Phosphatase: 118 U/L (ref 38–126)
Anion gap: 11 (ref 5–15)
BUN: 21 mg/dL (ref 8–23)
CHLORIDE: 100 mmol/L (ref 98–111)
CO2: 28 mmol/L (ref 22–32)
Calcium: 9.2 mg/dL (ref 8.9–10.3)
Creatinine: 1.46 mg/dL — ABNORMAL HIGH (ref 0.44–1.00)
GFR, EST AFRICAN AMERICAN: 36 mL/min — AB (ref >60)
GFR, Estimated: 31 mL/min — ABNORMAL LOW (ref >60)
Glucose, Bld: 107 mg/dL — ABNORMAL HIGH (ref 70–99)
POTASSIUM: 3.9 mmol/L (ref 3.5–5.1)
SODIUM: 139 mmol/L (ref 135–145)
Total Bilirubin: 0.9 mg/dL (ref 0.3–1.2)
Total Protein: 7.6 g/dL (ref 6.5–8.1)

## 2018-01-02 LAB — MULTIPLE MYELOMA PANEL, SERUM
ALBUMIN SERPL ELPH-MCNC: 2.9 g/dL (ref 2.9–4.4)
Albumin/Glob SerPl: 0.8 (ref 0.7–1.7)
Alpha 1: 0.4 g/dL (ref 0.0–0.4)
Alpha2 Glob SerPl Elph-Mcnc: 1.3 g/dL — ABNORMAL HIGH (ref 0.4–1.0)
B-Globulin SerPl Elph-Mcnc: 1 g/dL (ref 0.7–1.3)
Gamma Glob SerPl Elph-Mcnc: 1.4 g/dL (ref 0.4–1.8)
Globulin, Total: 4 g/dL — ABNORMAL HIGH (ref 2.2–3.9)
IGM (IMMUNOGLOBULIN M), SRM: 52 mg/dL (ref 26–217)
IgG (Immunoglobin G), Serum: 1493 mg/dL (ref 700–1600)
TOTAL PROTEIN ELP: 6.9 g/dL (ref 6.0–8.5)

## 2018-01-03 DIAGNOSIS — R197 Diarrhea, unspecified: Secondary | ICD-10-CM | POA: Diagnosis not present

## 2018-01-08 ENCOUNTER — Inpatient Hospital Stay (HOSPITAL_BASED_OUTPATIENT_CLINIC_OR_DEPARTMENT_OTHER): Payer: Medicare Other | Admitting: Oncology

## 2018-01-08 VITALS — BP 176/57 | HR 93 | Temp 98.4°F | Resp 17 | Ht 64.0 in | Wt 176.3 lb

## 2018-01-08 DIAGNOSIS — N1330 Unspecified hydronephrosis: Secondary | ICD-10-CM | POA: Diagnosis not present

## 2018-01-08 DIAGNOSIS — D72829 Elevated white blood cell count, unspecified: Secondary | ICD-10-CM | POA: Diagnosis not present

## 2018-01-08 DIAGNOSIS — R109 Unspecified abdominal pain: Secondary | ICD-10-CM

## 2018-01-08 DIAGNOSIS — Z7982 Long term (current) use of aspirin: Secondary | ICD-10-CM | POA: Diagnosis not present

## 2018-01-08 DIAGNOSIS — Z79899 Other long term (current) drug therapy: Secondary | ICD-10-CM

## 2018-01-08 DIAGNOSIS — M353 Polymyalgia rheumatica: Secondary | ICD-10-CM

## 2018-01-08 NOTE — Progress Notes (Signed)
Hematology and Oncology Follow Up Visit  [name] [id number] September 04, 1928 84 y.o. 01/08/2018 9:34 AM Bryant, Carla Bihari, MDLittle, Carla Bihari, MD   Principle Diagnosis: 84 year old woman with the following:  1.  Elevated protein documented in 2015.  Work-up did not reveal any evidence of plasma cell disorder.  2.  Leukocytosis: Etiology appears to be reactive although lymphoproliferative disorder would be a consideration.  3.  Right-sided hydronephrosis related to a right ureteral obstruction: No malignancy identified based on a cystoscopy done on June 2019.   Current therapy: Active surveillance.  Interim History: [name] presents today for a follow-up visit.  Since last visit, she was hospitalized briefly in end of June 2019 with complaint of flank pain.  She was found to have right-sided hydronephrosis and underwent a cystoscopy and retrograde examination under the care of Dr. Diona Fanti completed on 12/14/2017.  Double-J stent placement was completed and pathology did not show any evidence of malignancy.  Since her discharge, she still has intermittent right-sided flank pain with occasional abdominal pain.  Her appetite is reasonable has not lost any weight.  She denies any dysuria but occasional hematuria after stent placement.  She still ambulate with the help of a walker without any falls or syncope.  She does not report any headaches, blurry vision, syncope or seizures. Does not report any fevers, chills or sweats.  Does not report any cough, wheezing or hemoptysis.  Does not report any chest pain, palpitation, orthopnea or leg edema.  Does not report any nausea, vomiting or abdominal pain.  Does not report any constipation or diarrhea.  Does not report any skeletal complaints.    Does not report frequency, urgency or hematuria.  Does not report any skin rashes or lesions.  Does not report any lymphadenopathy or petechiae.  .  Remaining review of systems is negative.    Medications: I have  reviewed the patient's current medications.  Current Outpatient Medications  Medication Sig Dispense Refill  . amLODipine (NORVASC) 10 MG tablet Take 1 tablet (10 mg total) by mouth daily. 90 tablet 3  . Ascorbic Acid (VITAMIN C PO) Take 1 tablet by mouth daily at 3 pm.    . aspirin EC 81 MG tablet Take 81 mg by mouth daily at 3 pm.    . azelastine (ASTELIN) 0.1 % nasal spray Place 1 spray into both nostrils 2 (two) times daily. Use in each nostril as directed    . Biotin 5000 MCG TABS Take 5,000 mcg by mouth once a week.     . Calcium Citrate (CITRACAL PO) Take 1 tablet by mouth daily at 3 pm.     . carvedilol (COREG) 3.125 MG tablet Take 3.125 mg by mouth 2 (two) times daily.    . cephALEXin (KEFLEX) 500 MG capsule Take 1 capsule (500 mg total) by mouth 2 (two) times daily.  0  . Cholecalciferol 4000 UNITS CAPS Take 4,000 Units by mouth daily at 3 pm.    . cloNIDine (CATAPRES) 0.1 MG tablet Take 0.1 mg by mouth 2 (two) times daily.    Marland Kitchen dextromethorphan-guaiFENesin (MUCINEX DM) 30-600 MG 12hr tablet Take 1 tablet by mouth 2 (two) times daily.    . furosemide (LASIX) 40 MG tablet Take 1 tablet (40 mg total) by mouth daily. Morning and mid evening (Patient taking differently: Take 40 mg by mouth 2 (two) times daily. Morning and mid evening) 30 tablet 11  . irbesartan (AVAPRO) 300 MG tablet Take 300 mg by mouth daily.    Marland Kitchen  Levothyroxine Sodium 50 MCG CAPS Take 1 capsule by mouth daily.     . metroNIDAZOLE (FLAGYL) 500 MG tablet Take 500 mg by mouth 3 (three) times daily. for 7 days  0  . METRONIDAZOLE, TOPICAL, 0.75 % LOTN Apply 1 application topically at bedtime as needed (rosacea).    . Multiple Vitamin (MULITIVITAMIN WITH MINERALS) TABS Take 1 tablet by mouth daily at 3 pm.     . pantoprazole (PROTONIX) 40 MG tablet Take 40 mg by mouth daily before breakfast.    . potassium chloride (KLOR-CON) 8 MEQ tablet Take 8 mEq by mouth daily.   11  . predniSONE (DELTASONE) 5 MG tablet Take 5 mg by  mouth 2 (two) times daily with a meal.     . pyridOXINE (VITAMIN B-6) 100 MG tablet Take 100 mg by mouth once a week. On Thursday    . vitamin B-12 (CYANOCOBALAMIN) 500 MCG tablet Take 500 mcg by mouth once a week. On Thursdays     No current facility-administered medications for this visit.      Allergies:  Allergies  Allergen Reactions  . Accutane [Isotretinoin] Other (See Comments)    Affected liver  . Ciprofloxacin Rash and Other (See Comments)    Kidney failure  . Erythromycin Diarrhea and Rash  . Gluten Diarrhea and Other (See Comments)    Celiac sprue  . Gluten Meal Diarrhea and Rash    Celiac sprue intolerable  . Isotretinoin Other (See Comments)    Affects liver  . Lipitor [Atorvastatin] Other (See Comments)    Affects legs  . Lisinopril Other (See Comments)    Pt doesn't remember reaction   . Lovastatin Other (See Comments)    Affects legs  . Penicillins Other (See Comments)    From childhood  . Phenytoin Sodium Extended Other (See Comments)    Affects liver  . Pravastatin Other (See Comments)    Affected legs  . Crestor [Rosuvastatin]     Muscle aches even on 5mg  once weekly  . Gabapentin Other (See Comments)    Shaky, swollen legs  . Metoprolol Itching and Swelling  . Prolia [Denosumab] Other (See Comments)    Joints and muscle aches, loss of hair  . Boniva [Ibandronic Acid] Other (See Comments)    headaches  . Clarithromycin Rash  . Hydralazine Rash    Pt does not know what the reactions were.  Marland Kitchen Lescol [Fluvastatin Sodium] Other (See Comments)    Affects legs  . Sulfa Antibiotics Rash  . Sulfasalazine Rash  . Welchol [Colesevelam Hcl] Other (See Comments)    No energy  . Zetia [Ezetimibe] Other (See Comments)    myalgias  . Zithromax [Azithromycin] Rash    Past Medical History, Surgical history, Social history, and Family History were reviewed and updated.    Physical Exam: Blood pressure (!) 176/57, pulse 93, temperature 98.4 F (36.9  C), temperature source Oral, resp. rate 17, height 5\' 4"  (1.626 m), weight 176 lb 4.8 oz (80 kg), SpO2 99 %.   ECOG:  2 General appearance: alert and cooperative appeared without distress. Head: Normocephalic, without obvious abnormality Oropharynx: No oral thrush or ulcers. Eyes: No scleral icterus.  Pupils are equal and round reactive to light. Lymph nodes: Cervical, supraclavicular, and axillary nodes normal. Heart:regular rate and rhythm, S1, S2 normal, no murmur, click, rub or gallop Lung:chest clear, no wheezing, rales, normal symmetric air entry Abdomin: soft, non-tender, without masses or organomegaly. Neurological: No motor, sensory deficits.  Intact deep tendon  reflexes. Skin: No rashes or lesions.  No ecchymosis or petechiae. Musculoskeletal: No joint deformity or effusion.     Lab Results: Lab Results  Component Value Date   WBC 14.6 (H) 01/01/2018   HGB 11.4 (L) 01/01/2018   HCT 35.3 01/01/2018   MCV 73.7 (L) 01/01/2018   PLT 608 (H) 01/01/2018     Chemistry      Component Value Date/Time   NA 139 01/01/2018 1046   NA 139 01/15/2017 0953   K 3.9 01/01/2018 1046   CL 100 01/01/2018 1046   CO2 28 01/01/2018 1046   BUN 21 01/01/2018 1046   BUN 11 01/15/2017 0953   CREATININE 1.46 (H) 01/01/2018 1046      Component Value Date/Time   CALCIUM 9.2 01/01/2018 1046   ALKPHOS 118 01/01/2018 1046   AST 15 01/01/2018 1046   ALT 14 01/01/2018 1046   BILITOT 0.9 01/01/2018 1046       Radiological Studies: EXAM: CT ABDOMEN AND PELVIS WITHOUT CONTRAST  TECHNIQUE: Multidetector CT imaging of the abdomen and pelvis was performed following the standard protocol without IV contrast.  COMPARISON:  12/13/2017  FINDINGS: Lower chest: Lung bases are clear.  Hepatobiliary: No focal hepatic lesion. No biliary duct dilatation. Gallbladder is normal. Common bile duct is normal.  Pancreas: Pancreas is normal. No ductal dilatation. No  pancreatic inflammation.  Spleen: Normal spleen  Adrenals/urinary tract: Adrenal glands normal. LEFT kidney is atrophic. Ureteral stent within the RIGHT extrarenal pelvis. Stent extends to the bladder. No RIGHT hydronephrosis. Several small exophytic round lesions in LEFT and RIGHT kidney are mixed density not changed from comparison CT 01/13/2015  Bladder normal  Stomach/Bowel: Stomach, small bowel, appendix, and cecum are normal. Multiple diverticula of the descending colon sigmoid colon without acute inflammation.  Vascular/Lymphatic: Abdominal aorta is normal caliber with atherosclerotic calcification. There is no retroperitoneal or periportal lymphadenopathy. No pelvic lymphadenopathy.  Reproductive: Post hysterectomy  Other: No free fluid.  Musculoskeletal: No aggressive osseous lesion.  IMPRESSION: 1. No acute abdominopelvic findings identified. 2. RIGHT ureteral stent without hydronephrosis. 3. LEFT renal atrophy again demonstrated. 4. LEFT colon diverticulosis without evidence diverticulitis. 5. Normal appendix. 6.  Aortic Atherosclerosis (ICD10-I70.0).   Impression and Plan:  84 year old woman with:  1.  Elevated protein: These findings dating back to at least 2015 with a protein level around 9.1.  Repeat laboratory data on January 01, 2018 showed a normal total protein.  Her serum protein electrophoresis was reviewed today and showed no evidence of monoclonal gammopathy.  The differential diagnosis was discussed today with the patient and her daughter and her elevated protein is likely reactive in nature related to an autoimmune phenomenon.  She has been diagnosed with polymyalgia rheumatica which can cause elevation in her total protein.  From a management standpoint, I see no further intervention is needed or any further evaluation.  2.  Leukocytosis: She is currently on prednisone which could explain her leukocytosis.  Lymphoproliferative disorder  would be considered less likely but a possibility given her age.  I recommended periodic monitoring of her CBC in the future.  3.  Right-sided hydronephrosis: She is currently under evaluation of the care of Dr. Diona Fanti.  No malignancy has been identified at this time.  If a malignancy is identified in the future happy to help in her care if needed.  4.  Follow-up: I am happy to see her in the future as needed.  15  minutes was spent with the patient face-to-face today.  More than 50% of time was dedicated to patient counseling, education and coordination of her care.     Zola Button, MD 7/24/20199:34 AM

## 2018-01-16 DIAGNOSIS — N2889 Other specified disorders of kidney and ureter: Secondary | ICD-10-CM | POA: Diagnosis not present

## 2018-01-16 DIAGNOSIS — R1011 Right upper quadrant pain: Secondary | ICD-10-CM | POA: Diagnosis not present

## 2018-01-16 DIAGNOSIS — K838 Other specified diseases of biliary tract: Secondary | ICD-10-CM | POA: Diagnosis not present

## 2018-01-16 DIAGNOSIS — R197 Diarrhea, unspecified: Secondary | ICD-10-CM | POA: Diagnosis not present

## 2018-01-17 ENCOUNTER — Telehealth: Payer: Self-pay

## 2018-01-17 DIAGNOSIS — N130 Hydronephrosis with ureteropelvic junction obstruction: Secondary | ICD-10-CM | POA: Diagnosis not present

## 2018-01-17 DIAGNOSIS — N1330 Unspecified hydronephrosis: Secondary | ICD-10-CM | POA: Diagnosis not present

## 2018-01-17 NOTE — Telephone Encounter (Signed)
She is fine to hold ASA for colonscopy

## 2018-01-17 NOTE — Telephone Encounter (Signed)
   [facility] Pre-operative Risk Assessment    Request for surgical clearance:  1. What type of surgery is being performed? Endoscopy and colonoscopy    2. When is this surgery scheduled? 01/28/2018   3. What type of clearance is required (medical clearance vs. Pharmacy clearance to hold med vs. Both)? Pharmacy clearance to hold med  4. Are there any medications that need to be held prior to surgery and how long?  Aspirin 81 mg - not noted how long to hold  5. Practice name and name of physician performing surgery? Dr. Richmond Campbell at [facility]   6. What is your office phone number  [phone]    7.   What is your office fax number [phone]  8.   Anesthesia type (None, local, MAC, general) ? MAC   Damian Leavell 01/17/2018, 12:22 PM  _________________________________________________________________   (provider comments below)

## 2018-01-17 NOTE — Telephone Encounter (Signed)
   Primary Cardiologist: Fransico Him, MD  Chart reviewed as part of pre-operative protocol coverage. Patient was contacted 01/17/2018 in reference to pre-operative risk assessment for pending surgery as outlined below.   I spoke with patient and she is feeling well without any new cardiac symptoms. Therefore, based on ACC/AHA guidelines, the patient would be at acceptable risk for the planned procedure without further cardiovascular testing.   Per Dr. Radford Pax, "She is fine to hold ASA for colonscopy." The clearance we received did not specify how long Dr. Earlean Shawl wanted to hold aspirin for, so we will defer to the GI team to call the patient with further instructions before her procedure.  I will route this recommendation to the requesting party via Epic fax function and remove from pre-op pool.  Please call with questions.  [name], PA-C 01/17/2018, 4:40 PM

## 2018-01-17 NOTE — Telephone Encounter (Signed)
   Primary Cardiologist: Fransico Him, MD  Chart reviewed as part of pre-operative protocol coverage. Patient was contacted 01/17/2018 in reference to pre-operative risk assessment for pending surgery as outlined below.  [name] was last seen 08/2017 by Dr. Radford Pax. H/o CADs/p CABG 2002, HTN, dyslipidemia, mild carotid disease, celiac sprue, DM, statin intolerance, GERD, meningioma.   Request below is to hold ASA for colonoscopy. Not on any other antiplatelets. Will route to Dr. Radford Pax for input, then pt will need call from preop team to make sure doing OK. Dr. Radford Pax - Please route response to P [facility] PREOP (the pre-op pool). Thank you.   [name], PA-C 01/17/2018, 2:14 PM

## 2018-01-20 ENCOUNTER — Encounter (HOSPITAL_COMMUNITY): Payer: Self-pay

## 2018-01-20 ENCOUNTER — Telehealth: Payer: Self-pay | Admitting: Cardiology

## 2018-01-20 ENCOUNTER — Other Ambulatory Visit: Payer: Self-pay

## 2018-01-20 DIAGNOSIS — N390 Urinary tract infection, site not specified: Secondary | ICD-10-CM | POA: Diagnosis not present

## 2018-01-20 DIAGNOSIS — R112 Nausea with vomiting, unspecified: Secondary | ICD-10-CM | POA: Diagnosis not present

## 2018-01-20 DIAGNOSIS — N183 Chronic kidney disease, stage 3 (moderate): Secondary | ICD-10-CM | POA: Diagnosis not present

## 2018-01-20 DIAGNOSIS — Z951 Presence of aortocoronary bypass graft: Secondary | ICD-10-CM | POA: Diagnosis not present

## 2018-01-20 DIAGNOSIS — Z79899 Other long term (current) drug therapy: Secondary | ICD-10-CM

## 2018-01-20 DIAGNOSIS — N136 Pyonephrosis: Secondary | ICD-10-CM | POA: Diagnosis present

## 2018-01-20 DIAGNOSIS — K449 Diaphragmatic hernia without obstruction or gangrene: Secondary | ICD-10-CM | POA: Diagnosis present

## 2018-01-20 DIAGNOSIS — Z880 Allergy status to penicillin: Secondary | ICD-10-CM

## 2018-01-20 DIAGNOSIS — E669 Obesity, unspecified: Secondary | ICD-10-CM | POA: Diagnosis present

## 2018-01-20 DIAGNOSIS — E1122 Type 2 diabetes mellitus with diabetic chronic kidney disease: Secondary | ICD-10-CM | POA: Diagnosis present

## 2018-01-20 DIAGNOSIS — E039 Hypothyroidism, unspecified: Secondary | ICD-10-CM | POA: Diagnosis present

## 2018-01-20 DIAGNOSIS — D72829 Elevated white blood cell count, unspecified: Secondary | ICD-10-CM | POA: Diagnosis not present

## 2018-01-20 DIAGNOSIS — D631 Anemia in chronic kidney disease: Secondary | ICD-10-CM | POA: Diagnosis present

## 2018-01-20 DIAGNOSIS — I130 Hypertensive heart and chronic kidney disease with heart failure and stage 1 through stage 4 chronic kidney disease, or unspecified chronic kidney disease: Secondary | ICD-10-CM | POA: Diagnosis present

## 2018-01-20 DIAGNOSIS — I5032 Chronic diastolic (congestive) heart failure: Secondary | ICD-10-CM | POA: Diagnosis not present

## 2018-01-20 DIAGNOSIS — K900 Celiac disease: Secondary | ICD-10-CM | POA: Diagnosis present

## 2018-01-20 DIAGNOSIS — I2510 Atherosclerotic heart disease of native coronary artery without angina pectoris: Secondary | ICD-10-CM | POA: Diagnosis present

## 2018-01-20 DIAGNOSIS — M353 Polymyalgia rheumatica: Secondary | ICD-10-CM | POA: Diagnosis present

## 2018-01-20 DIAGNOSIS — K2900 Acute gastritis without bleeding: Secondary | ICD-10-CM | POA: Diagnosis present

## 2018-01-20 DIAGNOSIS — E860 Dehydration: Secondary | ICD-10-CM | POA: Diagnosis present

## 2018-01-20 DIAGNOSIS — Z960 Presence of urogenital implants: Secondary | ICD-10-CM | POA: Diagnosis not present

## 2018-01-20 DIAGNOSIS — Z7952 Long term (current) use of systemic steroids: Secondary | ICD-10-CM

## 2018-01-20 DIAGNOSIS — Z888 Allergy status to other drugs, medicaments and biological substances status: Secondary | ICD-10-CM

## 2018-01-20 DIAGNOSIS — Z86011 Personal history of benign neoplasm of the brain: Secondary | ICD-10-CM

## 2018-01-20 DIAGNOSIS — Z7982 Long term (current) use of aspirin: Secondary | ICD-10-CM

## 2018-01-20 DIAGNOSIS — Z885 Allergy status to narcotic agent status: Secondary | ICD-10-CM

## 2018-01-20 DIAGNOSIS — R109 Unspecified abdominal pain: Secondary | ICD-10-CM | POA: Diagnosis not present

## 2018-01-20 DIAGNOSIS — R079 Chest pain, unspecified: Secondary | ICD-10-CM | POA: Diagnosis not present

## 2018-01-20 DIAGNOSIS — N179 Acute kidney failure, unspecified: Principal | ICD-10-CM | POA: Diagnosis present

## 2018-01-20 DIAGNOSIS — D509 Iron deficiency anemia, unspecified: Secondary | ICD-10-CM | POA: Diagnosis present

## 2018-01-20 DIAGNOSIS — K219 Gastro-esophageal reflux disease without esophagitis: Secondary | ICD-10-CM | POA: Diagnosis present

## 2018-01-20 DIAGNOSIS — K8020 Calculus of gallbladder without cholecystitis without obstruction: Secondary | ICD-10-CM | POA: Diagnosis not present

## 2018-01-20 DIAGNOSIS — Z881 Allergy status to other antibiotic agents status: Secondary | ICD-10-CM

## 2018-01-20 DIAGNOSIS — Z6830 Body mass index (BMI) 30.0-30.9, adult: Secondary | ICD-10-CM

## 2018-01-20 DIAGNOSIS — E785 Hyperlipidemia, unspecified: Secondary | ICD-10-CM | POA: Diagnosis present

## 2018-01-20 DIAGNOSIS — Z96659 Presence of unspecified artificial knee joint: Secondary | ICD-10-CM | POA: Diagnosis present

## 2018-01-20 DIAGNOSIS — K640 First degree hemorrhoids: Secondary | ICD-10-CM | POA: Diagnosis present

## 2018-01-20 DIAGNOSIS — Z882 Allergy status to sulfonamides status: Secondary | ICD-10-CM

## 2018-01-20 DIAGNOSIS — K222 Esophageal obstruction: Secondary | ICD-10-CM | POA: Diagnosis present

## 2018-01-20 DIAGNOSIS — K5730 Diverticulosis of large intestine without perforation or abscess without bleeding: Secondary | ICD-10-CM | POA: Diagnosis present

## 2018-01-20 NOTE — Telephone Encounter (Signed)
New Message        [facility] Pre-operative Risk Assessment    Request for surgical clearance:  1. What type of surgery is being performed? Biopsy of uretal tumor   2. When is this surgery scheduled? TBD  3. What type of clearance is required (medical clearance vs. Pharmacy clearance to hold med vs. Both)? Both   4. Are there any medications that need to be held prior to surgery and how long? Stop aspirin   Per Dr. Radford Pax Recommendation  5. Practice name and name of physician performing surgery? [facility]. Dr. Jasmine Pang  6. What is your office phone number [phone]   7.   What is your office fax number [phone]  8.   Anesthesia type (None, local, MAC, general) ? general   Avaletta L Williams 01/20/2018, 4:24 PM  _________________________________________________________________   (provider comments below)

## 2018-01-20 NOTE — ED Triage Notes (Signed)
PT presents to Ed from home for ABD pain and N/V/D. Pt reports that she recently had a urethral stent placed because of a tumor. Pt also was told recently that she has sludge in her gallbladder. Pt supposed to have both removed. Pt reports pain got worse today.

## 2018-01-21 ENCOUNTER — Other Ambulatory Visit: Payer: Self-pay

## 2018-01-21 ENCOUNTER — Inpatient Hospital Stay (HOSPITAL_COMMUNITY)
Admission: EM | Admit: 2018-01-21 | Discharge: 2018-01-25 | DRG: 683 | Disposition: A | Discharge status: Home / Self Care | Payer: Medicare Other | Attending: Internal Medicine | Admitting: Internal Medicine

## 2018-01-21 ENCOUNTER — Emergency Department (HOSPITAL_COMMUNITY): Payer: Medicare Other

## 2018-01-21 ENCOUNTER — Encounter (HOSPITAL_COMMUNITY): Payer: Self-pay | Admitting: Radiology

## 2018-01-21 DIAGNOSIS — Z951 Presence of aortocoronary bypass graft: Secondary | ICD-10-CM | POA: Diagnosis not present

## 2018-01-21 DIAGNOSIS — Z7952 Long term (current) use of systemic steroids: Secondary | ICD-10-CM | POA: Diagnosis not present

## 2018-01-21 DIAGNOSIS — D649 Anemia, unspecified: Secondary | ICD-10-CM | POA: Diagnosis present

## 2018-01-21 DIAGNOSIS — N179 Acute kidney failure, unspecified: Secondary | ICD-10-CM | POA: Diagnosis not present

## 2018-01-21 DIAGNOSIS — K449 Diaphragmatic hernia without obstruction or gangrene: Secondary | ICD-10-CM | POA: Diagnosis not present

## 2018-01-21 DIAGNOSIS — N390 Urinary tract infection, site not specified: Secondary | ICD-10-CM

## 2018-01-21 DIAGNOSIS — K640 First degree hemorrhoids: Secondary | ICD-10-CM | POA: Diagnosis not present

## 2018-01-21 DIAGNOSIS — K8020 Calculus of gallbladder without cholecystitis without obstruction: Secondary | ICD-10-CM

## 2018-01-21 DIAGNOSIS — Z960 Presence of urogenital implants: Secondary | ICD-10-CM

## 2018-01-21 DIAGNOSIS — D72829 Elevated white blood cell count, unspecified: Secondary | ICD-10-CM

## 2018-01-21 DIAGNOSIS — R109 Unspecified abdominal pain: Secondary | ICD-10-CM | POA: Diagnosis present

## 2018-01-21 DIAGNOSIS — K2900 Acute gastritis without bleeding: Secondary | ICD-10-CM | POA: Diagnosis not present

## 2018-01-21 DIAGNOSIS — I5032 Chronic diastolic (congestive) heart failure: Secondary | ICD-10-CM | POA: Diagnosis not present

## 2018-01-21 DIAGNOSIS — R079 Chest pain, unspecified: Secondary | ICD-10-CM | POA: Diagnosis not present

## 2018-01-21 DIAGNOSIS — K222 Esophageal obstruction: Secondary | ICD-10-CM | POA: Diagnosis not present

## 2018-01-21 DIAGNOSIS — N183 Chronic kidney disease, stage 3 (moderate): Secondary | ICD-10-CM | POA: Diagnosis not present

## 2018-01-21 DIAGNOSIS — I2510 Atherosclerotic heart disease of native coronary artery without angina pectoris: Secondary | ICD-10-CM | POA: Diagnosis not present

## 2018-01-21 DIAGNOSIS — I130 Hypertensive heart and chronic kidney disease with heart failure and stage 1 through stage 4 chronic kidney disease, or unspecified chronic kidney disease: Secondary | ICD-10-CM | POA: Diagnosis not present

## 2018-01-21 DIAGNOSIS — N136 Pyonephrosis: Secondary | ICD-10-CM | POA: Diagnosis not present

## 2018-01-21 DIAGNOSIS — R112 Nausea with vomiting, unspecified: Secondary | ICD-10-CM | POA: Diagnosis not present

## 2018-01-21 LAB — URINALYSIS, ROUTINE W REFLEX MICROSCOPIC
BILIRUBIN URINE: NEGATIVE
Bacteria, UA: NONE SEEN
GLUCOSE, UA: 50 mg/dL — AB
KETONES UR: NEGATIVE mg/dL
Nitrite: NEGATIVE
PH: 6 (ref 5.0–8.0)
Protein, ur: 100 mg/dL — AB
Specific Gravity, Urine: 1.01 (ref 1.005–1.030)

## 2018-01-21 LAB — CBC WITH DIFFERENTIAL/PLATELET
Basophils Absolute: 0 10*3/uL (ref 0.0–0.1)
Basophils Relative: 0 %
EOS PCT: 2 %
Eosinophils Absolute: 0.3 10*3/uL (ref 0.0–0.7)
HCT: 32.5 % — ABNORMAL LOW (ref 36.0–46.0)
HEMOGLOBIN: 10.2 g/dL — AB (ref 12.0–15.0)
Lymphocytes Relative: 54 %
Lymphs Abs: 8.8 10*3/uL — ABNORMAL HIGH (ref 0.7–4.0)
MCH: 24.5 pg — AB (ref 26.0–34.0)
MCHC: 31.4 g/dL (ref 30.0–36.0)
MCV: 78.1 fL (ref 78.0–100.0)
MONO ABS: 0.7 10*3/uL (ref 0.1–1.0)
Monocytes Relative: 4 %
Neutro Abs: 6.5 10*3/uL (ref 1.7–7.7)
Neutrophils Relative %: 40 %
Platelets: 384 10*3/uL (ref 150–400)
RBC: 4.16 MIL/uL (ref 3.87–5.11)
RDW: 19.4 % — ABNORMAL HIGH (ref 11.5–15.5)
WBC: 16.3 10*3/uL — ABNORMAL HIGH (ref 4.0–10.5)

## 2018-01-21 LAB — GLUCOSE, CAPILLARY: GLUCOSE-CAPILLARY: 106 mg/dL — AB (ref 70–99)

## 2018-01-21 LAB — PATHOLOGIST SMEAR REVIEW

## 2018-01-21 LAB — COMPREHENSIVE METABOLIC PANEL
ALBUMIN: 2.7 g/dL — AB (ref 3.5–5.0)
ALK PHOS: 78 U/L (ref 38–126)
ALT: 13 U/L (ref 0–44)
AST: 15 U/L (ref 15–41)
Anion gap: 11 (ref 5–15)
BUN: 37 mg/dL — ABNORMAL HIGH (ref 8–23)
CALCIUM: 8.2 mg/dL — AB (ref 8.9–10.3)
CO2: 22 mmol/L (ref 22–32)
Chloride: 103 mmol/L (ref 98–111)
Creatinine, Ser: 2.1 mg/dL — ABNORMAL HIGH (ref 0.44–1.00)
GFR calc Af Amer: 23 mL/min — ABNORMAL LOW (ref >60)
GFR calc non Af Amer: 20 mL/min — ABNORMAL LOW (ref >60)
Glucose, Bld: 101 mg/dL — ABNORMAL HIGH (ref 70–99)
Potassium: 3.9 mmol/L (ref 3.5–5.1)
SODIUM: 136 mmol/L (ref 135–145)
Total Bilirubin: 0.9 mg/dL (ref 0.3–1.2)
Total Protein: 5.9 g/dL — ABNORMAL LOW (ref 6.5–8.1)

## 2018-01-21 LAB — LIPASE, BLOOD: Lipase: 30 U/L (ref 11–51)

## 2018-01-21 LAB — HEMOGLOBIN A1C
Hgb A1c MFr Bld: 5.9 % — ABNORMAL HIGH (ref 4.8–5.6)
Mean Plasma Glucose: 122.63 mg/dL

## 2018-01-21 MED ORDER — PREDNISONE 1 MG PO TABS
3.0000 mg | ORAL_TABLET | Freq: Every day | ORAL | Status: DC
  Administered 2018-01-21: 3 mg via ORAL
  Filled 2018-01-21 (×2): qty 3

## 2018-01-21 MED ORDER — ASPIRIN EC 81 MG PO TBEC
81.0000 mg | DELAYED_RELEASE_TABLET | Freq: Every day | ORAL | Status: DC
  Filled 2018-01-21 (×2): qty 1

## 2018-01-21 MED ORDER — LACTATED RINGERS IV SOLN
INTRAVENOUS | Status: DC
  Administered 2018-01-21 – 2018-01-22 (×2): via INTRAVENOUS

## 2018-01-21 MED ORDER — INSULIN ASPART 100 UNIT/ML ~~LOC~~ SOLN
0.0000 [IU] | Freq: Three times a day (TID) | SUBCUTANEOUS | Status: DC
  Administered 2018-01-21 – 2018-01-23 (×3): 1 [IU] via SUBCUTANEOUS

## 2018-01-21 MED ORDER — CYANOCOBALAMIN 500 MCG PO TABS
500.0000 ug | ORAL_TABLET | ORAL | Status: DC
  Administered 2018-01-23: 500 ug via ORAL
  Filled 2018-01-21: qty 1

## 2018-01-21 MED ORDER — CLONIDINE HCL 0.1 MG PO TABS
0.1000 mg | ORAL_TABLET | Freq: Two times a day (BID) | ORAL | Status: DC
  Administered 2018-01-21 – 2018-01-25 (×9): 0.1 mg via ORAL
  Filled 2018-01-21 (×9): qty 1

## 2018-01-21 MED ORDER — PANTOPRAZOLE SODIUM 40 MG PO TBEC
40.0000 mg | DELAYED_RELEASE_TABLET | Freq: Every day | ORAL | Status: DC
  Administered 2018-01-22 – 2018-01-25 (×4): 40 mg via ORAL
  Filled 2018-01-21 (×4): qty 1

## 2018-01-21 MED ORDER — INSULIN ASPART 100 UNIT/ML ~~LOC~~ SOLN: 0.0000 [IU] | Freq: Every day | SUBCUTANEOUS | Status: DC

## 2018-01-21 MED ORDER — HYDROMORPHONE HCL 1 MG/ML IJ SOLN
1.0000 mg | Freq: Once | INTRAMUSCULAR | Status: AC
  Administered 2018-01-21: 1 mg via INTRAVENOUS
  Filled 2018-01-21: qty 1

## 2018-01-21 MED ORDER — ONDANSETRON HCL 4 MG/2ML IJ SOLN
4.0000 mg | Freq: Once | INTRAMUSCULAR | Status: AC
  Administered 2018-01-21: 4 mg via INTRAVENOUS
  Filled 2018-01-21: qty 2

## 2018-01-21 MED ORDER — LEVOTHYROXINE SODIUM 50 MCG PO TABS
50.0000 ug | ORAL_TABLET | Freq: Every day | ORAL | Status: DC
Start: 2018-01-21 — End: 2018-01-25
  Administered 2018-01-21 – 2018-01-25 (×5): 50 ug via ORAL
  Filled 2018-01-21 (×5): qty 1

## 2018-01-21 MED ORDER — POTASSIUM CHLORIDE ER 8 MEQ PO TBCR: 8.0000 meq | EXTENDED_RELEASE_TABLET | Freq: Every day | ORAL | Status: DC

## 2018-01-21 MED ORDER — VITAMIN B-6 100 MG PO TABS
100.0000 mg | ORAL_TABLET | ORAL | Status: DC
  Administered 2018-01-23: 100 mg via ORAL
  Filled 2018-01-21: qty 1

## 2018-01-21 MED ORDER — SODIUM CHLORIDE 0.9 % IV SOLN
1.0000 g | Freq: Once | INTRAVENOUS | Status: AC
  Administered 2018-01-21: 1 g via INTRAVENOUS
  Filled 2018-01-21: qty 10

## 2018-01-21 MED ORDER — DIPHENHYDRAMINE HCL 50 MG/ML IJ SOLN
12.5000 mg | Freq: Once | INTRAMUSCULAR | Status: AC
  Administered 2018-01-21: 12.5 mg via INTRAVENOUS
  Filled 2018-01-21: qty 1

## 2018-01-21 MED ORDER — SODIUM CHLORIDE 0.9 % IV SOLN
1.0000 g | INTRAVENOUS | Status: DC
  Administered 2018-01-22: 1 g via INTRAVENOUS
  Filled 2018-01-21: qty 10
  Filled 2018-01-21: qty 1

## 2018-01-21 MED ORDER — CARVEDILOL 3.125 MG PO TABS
1.5625 mg | ORAL_TABLET | Freq: Two times a day (BID) | ORAL | Status: DC
  Administered 2018-01-21 – 2018-01-25 (×8): 1.5625 mg via ORAL
  Filled 2018-01-21 (×8): qty 1

## 2018-01-21 MED ORDER — AMLODIPINE BESYLATE 10 MG PO TABS
10.0000 mg | ORAL_TABLET | Freq: Every day | ORAL | Status: DC
  Administered 2018-01-21 – 2018-01-25 (×5): 10 mg via ORAL
  Filled 2018-01-21 (×5): qty 1

## 2018-01-21 MED ORDER — PREDNISONE 5 MG PO TABS
5.0000 mg | ORAL_TABLET | Freq: Every day | ORAL | Status: DC
  Administered 2018-01-21: 5 mg via ORAL
  Filled 2018-01-21: qty 1

## 2018-01-21 MED ORDER — TRAMADOL HCL 50 MG PO TABS
50.0000 mg | ORAL_TABLET | ORAL | Status: DC | PRN
  Administered 2018-01-21 – 2018-01-22 (×2): 50 mg via ORAL
  Filled 2018-01-21 (×2): qty 1

## 2018-01-21 MED ORDER — METRONIDAZOLE 0.75 % EX GEL: 1.0000 "application " | Freq: Every evening | CUTANEOUS | Status: DC | PRN

## 2018-01-21 MED ORDER — SODIUM CHLORIDE 0.9 % IV BOLUS
1000.0000 mL | Freq: Once | INTRAVENOUS | Status: AC
  Administered 2018-01-21: 1000 mL via INTRAVENOUS

## 2018-01-21 MED ORDER — MORPHINE SULFATE (PF) 4 MG/ML IV SOLN
4.0000 mg | Freq: Once | INTRAVENOUS | Status: AC
  Administered 2018-01-21: 4 mg via INTRAVENOUS
  Filled 2018-01-21: qty 1

## 2018-01-21 MED ORDER — ONDANSETRON HCL 4 MG PO TABS
4.0000 mg | ORAL_TABLET | Freq: Three times a day (TID) | ORAL | Status: DC | PRN
  Administered 2018-01-23 – 2018-01-24 (×3): 4 mg via ORAL
  Filled 2018-01-21 (×3): qty 1

## 2018-01-21 NOTE — ED Provider Notes (Signed)
[facility] DEPT Provider Note   CSN: [id number] Arrival date & time: 01/20/18  2223     History   Chief Complaint Chief Complaint  Patient presents with  . Abdominal Pain  . Nausea  . Diarrhea    HPI [name] is a 84 y.o. female.  Patient is an 84 year old female with past medical history of coronary artery disease with CABG, hypertension, hyperlipidemia, diabetes, and known gallstones.  She presents today for evaluation of abdominal pain.  This has been ongoing for greater than 1 month, and has worsened over the past 2 days.  She reports everything she eats or drinks going "straight through her", and has had decreased appetite.  Her pain is mainly right-sided.  The history is provided by the patient.  Abdominal Pain   This is a new problem. The current episode started 2 days ago. The problem occurs constantly. The problem has been gradually worsening. The pain is associated with eating. The pain is located in the RUQ and RLQ. The quality of the pain is cramping. The pain is moderate. Pertinent negatives include anorexia, fever, hematochezia and constipation. The symptoms are aggravated by eating. Nothing relieves the symptoms.    Past Medical History:  Diagnosis Date  . Carotid artery occlusion    1-39% bilateral carotid stenosis by dopplers 05/2016  . Celiac sprue   . Coronary artery disease    s/p CABG with 50% stenosis at ostium of the SVG to dirst diagonal, patent LIMA to LAD and patent left circ with nonobstructive ASCAD of the RCA  . Diabetes mellitus    diet controlled  . Diverticulitis   . Gait disorder   . GERD (gastroesophageal reflux disease)   . Hyperchloremia   . Hyperlipidemia    statin intolerant  . Hypertension   . Meningioma ([place])   . Obesity   . Rosacea     Patient Active Problem List   Diagnosis Date Noted  . Hydronephrosis 12/13/2017  . Type 2 diabetes mellitus with complication ([place]) [birth year]1937  . Edema  extremities 05/03/2014  . Bilateral carotid artery stenosis   . Right carotid bruit 05/07/2013  . Heart murmur 05/07/2013  . Coronary artery disease   . Hyperlipidemia   . Hypertension   . Pain in limb 01/23/2013  . Pneumonia 08/05/2011  . Diabetes mellitus ([place]) 08/05/2011  . Cerebrovascular disease, unspecified 10/10/2010  . Dizziness and giddiness 10/10/2010  . Abnormality of gait 10/10/2010    Past Surgical History:  Procedure Laterality Date  . ABDOMINAL HYSTERECTOMY    . BRAIN TUMOR EXCISION  1993   meningioma, left frontal  . BUNIONECTOMY    . CARDIAC CATHETERIZATION    . CORONARY ARTERY BYPASS GRAFT  2002    3 vessel  . CYSTOSCOPY WITH RETROGRADE PYELOGRAM, URETEROSCOPY AND STENT PLACEMENT Right 12/14/2017   Procedure: CYSTOSCOPY WITH RIGHT RETROGRADE PYELOGRAM, RIGHT URETEROSCOPY AND RIGHT URETERAL STENT PLACEMENT, WASHINGS;  Surgeon: Franchot Gallo, MD;  Location: [facility];  Service: Urology;  Laterality: Right;  . JOINT REPLACEMENT  2009  . REPLACEMENT TOTAL KNEE    . ROTATOR CUFF REPAIR  2010   Dr. Gladstone Lighter  . TONSILLECTOMY       OB History   None      Home Medications    Prior to Admission medications   Medication Sig Start Date End Date Taking? Authorizing Provider  amLODipine (NORVASC) 10 MG tablet Take 1 tablet (10 mg total) by mouth daily. 08/20/17 08/15/18  Fransico Him  R, MD  Ascorbic Acid (VITAMIN C PO) Take 1 tablet by mouth daily at 3 pm.    [provider]  aspirin EC 81 MG tablet Take 81 mg by mouth daily at 3 pm.    [provider]  azelastine (ASTELIN) 0.1 % nasal spray Place 1 spray into both nostrils 2 (two) times daily. Use in each nostril as directed    [provider]  Biotin 5000 MCG TABS Take 5,000 mcg by mouth once a week.     [provider]  Calcium Citrate (CITRACAL PO) Take 1 tablet by mouth daily at 3 pm.     [provider]  carvedilol (COREG) 3.125 MG tablet Take 3.125 mg by mouth 2  (two) times daily. 11/06/17   [provider]  cephALEXin (KEFLEX) 500 MG capsule Take 1 capsule (500 mg total) by mouth 2 (two) times daily. 12/15/17   Franchot Gallo, MD  Cholecalciferol 4000 UNITS CAPS Take 4,000 Units by mouth daily at 3 pm.    [provider]  cloNIDine (CATAPRES) 0.1 MG tablet Take 0.1 mg by mouth 2 (two) times daily.    [provider]  dextromethorphan-guaiFENesin (MUCINEX DM) 30-600 MG 12hr tablet Take 1 tablet by mouth 2 (two) times daily.    [provider]  furosemide (LASIX) 40 MG tablet Take 1 tablet (40 mg total) by mouth daily. Morning and mid evening Patient taking differently: Take 40 mg by mouth 2 (two) times daily. Morning and mid evening 01/07/17   Turner, Eber Hong, MD  irbesartan (AVAPRO) 300 MG tablet Take 300 mg by mouth daily.    [provider]  Levothyroxine Sodium 50 MCG CAPS Take 1 capsule by mouth daily.     [provider]  metroNIDAZOLE (FLAGYL) 500 MG tablet Take 500 mg by mouth 3 (three) times daily. for 7 days 12/12/17   [provider]  METRONIDAZOLE, TOPICAL, 0.75 % LOTN Apply 1 application topically at bedtime as needed (rosacea).    [provider]  Multiple Vitamin (MULITIVITAMIN WITH MINERALS) TABS Take 1 tablet by mouth daily at 3 pm.     [provider]  pantoprazole (PROTONIX) 40 MG tablet Take 40 mg by mouth daily before breakfast.    [provider]  potassium chloride (KLOR-CON) 8 MEQ tablet Take 8 mEq by mouth daily.  04/26/14   [provider]  predniSONE (DELTASONE) 5 MG tablet Take 5 mg by mouth 2 (two) times daily with a meal.     [provider]  pyridOXINE (VITAMIN B-6) 100 MG tablet Take 100 mg by mouth once a week. On Thursday    [provider]  vitamin B-12 (CYANOCOBALAMIN) 500 MCG tablet Take 500 mcg by mouth once a week. On Thursdays    [provider]    Family History Family History  Problem  Relation Age of Onset  . Diabetes Mother   . Heart disease Brother   . Diabetes Sister   . Diabetes Sister     Social History Social History   Tobacco Use  . Smoking status: Never Smoker  . Smokeless tobacco: Never Used  Substance Use Topics  . Alcohol use: No  . Drug use: No     Allergies   Accutane [isotretinoin]; Ciprofloxacin; Erythromycin; Gluten; Gluten meal; Isotretinoin; Lipitor [atorvastatin]; Lisinopril; Lovastatin; Penicillins; Phenytoin sodium extended; Pravastatin; Crestor [rosuvastatin]; Gabapentin; Metoprolol; Prolia [denosumab]; Boniva [ibandronic acid]; Clarithromycin; Hydralazine; Lescol [fluvastatin sodium]; Sulfa antibiotics; Sulfasalazine; Welchol [colesevelam hcl]; Zetia [ezetimibe];  and Zithromax [azithromycin]   Review of Systems Review of Systems  Constitutional: Negative for fever.  Gastrointestinal: Positive for abdominal pain. Negative for anorexia, constipation and hematochezia.  All other systems reviewed and are negative.    Physical Exam Updated Vital Signs BP (!) 141/62 (BP Location: Left Arm)   Pulse 77   Temp 98.1 F (36.7 C) (Oral)   Resp 16   Ht 5\' 4"  (1.626 m)   Wt 79.8 kg (176 lb)   SpO2 95%   BMI 30.21 kg/m   Physical Exam  Constitutional: She is oriented to person, place, and time. She appears well-developed and well-nourished. No distress.  HENT:  Head: Normocephalic and atraumatic.  Neck: Normal range of motion. Neck supple.  Cardiovascular: Normal rate and regular rhythm. Exam reveals no gallop and no friction rub.  No murmur heard. Pulmonary/Chest: Effort normal and breath sounds normal. No respiratory distress. She has no wheezes.  Abdominal: Soft. Bowel sounds are normal. She exhibits no distension. There is tenderness in the right upper quadrant and right lower quadrant. There is no rigidity, no rebound and no guarding.  Musculoskeletal: Normal range of motion.  Neurological: She is alert and oriented to person,  place, and time.  Skin: Skin is warm and dry. She is not diaphoretic.  Nursing note and vitals reviewed.    ED Treatments / Results  Labs (all labs ordered are listed, but only abnormal results are displayed) Labs Reviewed  COMPREHENSIVE METABOLIC PANEL  LIPASE, BLOOD  CBC WITH DIFFERENTIAL/PLATELET  URINALYSIS, ROUTINE W REFLEX MICROSCOPIC    EKG None  Radiology No results found.  Procedures Procedures (including critical care time)  Medications Ordered in ED Medications  sodium chloride 0.9 % bolus 1,000 mL (has no administration in time range)  morphine 4 MG/ML injection 4 mg (has no administration in time range)  ondansetron (ZOFRAN) injection 4 mg (has no administration in time range)     Initial Impression / Assessment and Plan / ED Course  I have reviewed the triage vital signs and the nursing notes.  Pertinent labs & imaging results that were available during my care of the patient were reviewed by me and considered in my medical decision making (see chart for details).  Patient with history of known gallstones and recent ureteral stent for ureteral mass presenting with right-sided abdominal pain.  Her pain appears to be related to a UTI.  There is no change in the appearance of her gallbladder but UA does show infection.  She was given Rocephin and pain medicine and will be admitted to the hospitalist service.   Final Clinical Impressions(s) / ED Diagnoses   Final diagnoses:  None    ED Discharge Orders    None       Veryl Speak, MD 01/21/18 [phone]

## 2018-01-21 NOTE — Progress Notes (Deleted)
History and Physical    [name] IDP:[id number] DOB: 10/19/1928 DOA: 01/21/2018  PCP: Hulan Fess, MD  Patient coming from: home  I have personally briefly reviewed patient's old medical records in [place]  Chief Complaint: r sided abdominal pain  HPI: [name] is [name] 84 y.o. female with medical history significant of coronary disease status post CABG, hypertension, reflux, polymyalgia rheumatica, meningioma presenting with persistent R sided abdominal pain.  Mrs. Maack notes persistent abdominal pain since June.  She notes right-sided abdominal pain and pain across the top of her belly.  She notes that his pain did not really change after the stent placement.  She notes her symptoms got worse yesterday afternoon.  She noted diarrhea and dark stools.  She notes diarrhea just about after every time she eats.  In addition she noticed some nausea and vomiting as well.  She denies any dysuria or increased urinary frequency.  She denies any fevers, cough, chest pain, shortness of breath.  She does note some occasional chills.  She denies any recent antibiotics, but she was on antibiotics after the stent placement.  ED Course: Labs, CT, abx.  Admit to hospitalist.   Review of Systems: As per HPI otherwise 10 point review of systems negative.    Past Medical History:  Diagnosis Date  . Carotid artery occlusion    1-39% bilateral carotid stenosis by dopplers 05/2016  . Celiac sprue   . Coronary artery disease    s/p CABG with 50% stenosis at ostium of the SVG to dirst diagonal, patent LIMA to LAD and patent left circ with nonobstructive ASCAD of the RCA  . Diabetes mellitus    diet controlled  . Diverticulitis   . Gait disorder   . GERD (gastroesophageal reflux disease)   . Hyperchloremia   . Hyperlipidemia    statin intolerant  . Hypertension   . Meningioma ([place])   . Obesity   . Rosacea     Past Surgical History:  Procedure Laterality Date  . ABDOMINAL HYSTERECTOMY      . BRAIN TUMOR EXCISION  1993   meningioma, left frontal  . BUNIONECTOMY    . CARDIAC CATHETERIZATION    . CORONARY ARTERY BYPASS GRAFT  2002    3 vessel  . CYSTOSCOPY WITH RETROGRADE PYELOGRAM, URETEROSCOPY AND STENT PLACEMENT Right 12/14/2017   Procedure: CYSTOSCOPY WITH RIGHT RETROGRADE PYELOGRAM, RIGHT URETEROSCOPY AND RIGHT URETERAL STENT PLACEMENT, WASHINGS;  Surgeon: Franchot Gallo, MD;  Location: [facility];  Service: Urology;  Laterality: Right;  . JOINT REPLACEMENT  2009  . REPLACEMENT TOTAL KNEE    . ROTATOR CUFF REPAIR  2010   Dr. Gladstone Lighter  . TONSILLECTOMY       reports that she has never smoked. She has never used smokeless tobacco. She reports that she does not drink alcohol or use drugs.  Allergies  Allergen Reactions  . Accutane [Isotretinoin] Other (See Comments)    Affected liver  . Ciprofloxacin Rash and Other (See Comments)    Kidney failure  . Erythromycin Diarrhea and Rash  . Gluten Diarrhea and Other (See Comments)    Celiac sprue  . Gluten Meal Diarrhea and Rash    Celiac sprue intolerable  . Isotretinoin Other (See Comments)    Affects liver  . Lipitor [Atorvastatin] Other (See Comments)    Affects legs  . Lisinopril Other (See Comments)    Pt doesn't remember reaction   . Lovastatin Other (See Comments)    Affects legs  .  Penicillins Other (See Comments)    From childhood Has patient had Aliece Honold PCN reaction causing immediate rash, facial/tongue/throat swelling, SOB or lightheadedness with hypotension: Unknown Has patient had Marilin Kofman PCN reaction causing severe rash involving mucus membranes or skin necrosis: Unknown Has patient had Mysha Peeler PCN reaction that required hospitalization: Unknown Has patient had Hailyn Zarr PCN reaction occurring within the last 10 years: No If all of the above answers are "NO", then may proceed with Cephalosporin use.  Marland Kitchen Phenytoin Sodium Extended Other (See Comments)    Affects liver  . Pravastatin Other (See Comments)    Affected legs  .  Crestor [Rosuvastatin]     Muscle aches even on 5mg  once weekly  . Gabapentin Other (See Comments)    Shaky, swollen legs  . Hydrocodone Other (See Comments)    Restless leg  . Metoprolol Itching and Swelling  . Prolia [Denosumab] Other (See Comments)    Joints and muscle aches, loss of hair  . Boniva [Ibandronic Acid] Other (See Comments)    headaches  . Clarithromycin Rash  . Hydralazine Rash    Pt does not know what the reactions were.  Marland Kitchen Lescol [Fluvastatin Sodium] Other (See Comments)    Affects legs  . Sulfa Antibiotics Rash  . Sulfasalazine Rash  . Welchol [Colesevelam Hcl] Other (See Comments)    No energy  . Zetia [Ezetimibe] Other (See Comments)    myalgias  . Zithromax [Azithromycin] Rash    Family History  Problem Relation Age of Onset  . Diabetes Mother   . Heart disease Brother   . Diabetes Sister   . Diabetes Sister    Prior to Admission medications   Medication Sig Start Date End Date Taking? Authorizing Provider  amLODipine (NORVASC) 10 MG tablet Take 1 tablet (10 mg total) by mouth daily. 08/20/17 08/15/18 Yes Turner, Eber Hong, MD  Ascorbic Acid (VITAMIN C PO) Take 1 tablet by mouth daily at 3 pm.   Yes [provider]  aspirin EC 81 MG tablet Take 81 mg by mouth daily at 3 pm.   Yes [provider]  azelastine (ASTELIN) 0.1 % nasal spray Place 1 spray into both nostrils 2 (two) times daily. Use in each nostril as directed   Yes [provider]  Biotin 5000 MCG TABS Take 5,000 mcg by mouth once Cassady Turano week. On Tuesday   Yes [provider]  Calcium Citrate (CITRACAL PO) Take 1 tablet by mouth daily at 3 pm.    Yes [provider]  carvedilol (COREG) 3.125 MG tablet Take 1.5625 mg by mouth 2 (two) times daily.  11/06/17  Yes [provider]  Cholecalciferol 4000 UNITS CAPS Take 4,000 Units by mouth daily at 3 pm.   Yes [provider]  cloNIDine (CATAPRES) 0.1 MG tablet Take 0.1 mg by mouth 2 (two) times  daily.   Yes [provider]  furosemide (LASIX) 40 MG tablet Take 1 tablet (40 mg total) by mouth daily. Morning and mid evening Patient taking differently: Take 40 mg by mouth 2 (two) times daily. Morning and mid evening 01/07/17  Yes Turner, Traci R, MD  irbesartan (AVAPRO) 300 MG tablet Take 300 mg by mouth daily.   Yes [provider]  Levothyroxine Sodium 50 MCG CAPS Take 1 capsule by mouth daily.    Yes [provider]  METRONIDAZOLE, TOPICAL, 0.75 % LOTN Apply 1 application topically at bedtime as needed (rosacea).   Yes [provider]  Multiple Vitamin (MULITIVITAMIN WITH  MINERALS) TABS Take 1 tablet by mouth daily at 3 pm.    Yes [provider]  ondansetron (ZOFRAN) 4 MG tablet Take 4 mg by mouth every 8 (eight) hours as needed for nausea. for nausea 12/22/17  Yes [provider]  pantoprazole (PROTONIX) 40 MG tablet Take 40 mg by mouth daily before breakfast.   Yes [provider]  potassium chloride (KLOR-CON) 8 MEQ tablet Take 8 mEq by mouth daily.  04/26/14  Yes [provider]  predniSONE (DELTASONE) 1 MG tablet Take 3 mg by mouth daily. Take with 5 mg tablet   Yes [provider]  predniSONE (DELTASONE) 5 MG tablet Take 5 mg by mouth daily. Take with 1 mg tablets   Yes [provider]  pyridOXINE (VITAMIN B-6) 100 MG tablet Take 100 mg by mouth once Davyn Morandi week. On Thursday   Yes [provider]  sulfamethoxazole-trimethoprim (BACTRIM DS,SEPTRA DS) 800-160 MG tablet Take 1 tablet by mouth 2 (two) times daily. 01/20/18  Yes [provider]  traMADol (ULTRAM) 50 MG tablet Take 50 mg by mouth every 4 (four) hours as needed for moderate pain.  12/24/17  Yes [provider]  vitamin B-12 (CYANOCOBALAMIN) 500 MCG tablet Take 500 mcg by mouth once Coston Mandato week. On Thursdays   Yes [provider]    Physical Exam: Vitals:   01/20/18 2307 01/21/18 0546 01/21/18 0600 01/21/18 0800    BP:  (!) 154/66 (!) 154/57 (!) 163/60  Pulse:  70 66 76  Resp:  18 16 15   Temp:      TempSrc:      SpO2:  98% 96% 90%  Weight: 79.8 kg (176 lb)     Height: 5\' 4"  (1.626 m)       Constitutional: NAD, calm, comfortable Vitals:   01/20/18 2307 01/21/18 0546 01/21/18 0600 01/21/18 0800  BP:  (!) 154/66 (!) 154/57 (!) 163/60  Pulse:  70 66 76  Resp:  18 16 15   Temp:      TempSrc:      SpO2:  98% 96% 90%  Weight: 79.8 kg (176 lb)     Height: 5\' 4"  (1.626 m)      Eyes: PERRL, lids and conjunctivae normal ENMT: Mucous membranes are moist. Posterior pharynx clear of any exudate or lesions.Normal dentition.  Neck: normal, supple, no masses, no thyromegaly Respiratory: clear to auscultation bilaterally, no wheezing, no crackles. Normal respiratory effort. No accessory muscle use.  Cardiovascular: Regular rate and rhythm, no murmurs / rubs / gallops. No extremity edema. Abdomen: R sided abdominal TTP, no rebound or guarding, no masses palpated. No hepatosplenomegaly. Bowel sounds positive.  Musculoskeletal: no clubbing / cyanosis. No joint deformity upper and lower extremities. Good ROM, no contractures. Normal muscle tone.  Skin: no rashes, lesions, ulcers. No induration Neurologic: CN 2-12 grossly intact. Sensation intact. Moving all extremities equally. Psychiatric: Normal judgment and insight. Alert and oriented x 3. Normal mood.   Labs on Admission: I have personally reviewed following labs and imaging studies  CBC: Recent Labs  Lab 01/21/18 0630  WBC 16.3*  NEUTROABS 6.5  HGB 10.2*  HCT 32.5*  MCV 78.1  PLT 858   Basic Metabolic Panel: Recent Labs  Lab 01/21/18 0630  NA 136  K 3.9  CL 103  CO2 22  GLUCOSE 101*  BUN 37*  CREATININE 2.10*  CALCIUM 8.2*   GFR: Estimated Creatinine Clearance: 19.3 mL/min (Vonn Sliger) (by C-G formula based on SCr of 2.1 mg/dL (H)).  Liver Function Tests: Recent Labs  Lab 01/21/18 0630  AST 15  ALT 13  ALKPHOS 78  BILITOT 0.9  PROT  5.9*  ALBUMIN 2.7*   Recent Labs  Lab 01/21/18 0630  LIPASE 30   No results for input(s): AMMONIA in the last 168 hours. Coagulation Profile: No results for input(s): INR, PROTIME in the last 168 hours. Cardiac Enzymes: No results for input(s): CKTOTAL, CKMB, CKMBINDEX, TROPONINI in the last 168 hours. BNP (last 3 results) No results for input(s): PROBNP in the last 8760 hours. HbA1C: No results for input(s): HGBA1C in the last 72 hours. CBG: No results for input(s): GLUCAP in the last 168 hours. Lipid Profile: No results for input(s): CHOL, HDL, LDLCALC, TRIG, CHOLHDL, LDLDIRECT in the last 72 hours. Thyroid Function Tests: No results for input(s): TSH, T4TOTAL, FREET4, T3FREE, THYROIDAB in the last 72 hours. Anemia Panel: No results for input(s): VITAMINB12, FOLATE, FERRITIN, TIBC, IRON, RETICCTPCT in the last 72 hours. Urine analysis:    Component Value Date/Time   COLORURINE YELLOW 01/21/2018 0416   APPEARANCEUR HAZY (Jessicalynn Deshong) 01/21/2018 0416   LABSPEC 1.010 01/21/2018 0416   PHURINE 6.0 01/21/2018 0416   GLUCOSEU 50 (Mikiah Demond) 01/21/2018 0416   HGBUR LARGE (Arena Lindahl) 01/21/2018 0416   BILIRUBINUR NEGATIVE 01/21/2018 0416   KETONESUR NEGATIVE 01/21/2018 0416   PROTEINUR 100 (Danaiya Steadman) 01/21/2018 0416   UROBILINOGEN 0.2 07/08/2010 1238   NITRITE NEGATIVE 01/21/2018 0416   LEUKOCYTESUR LARGE (Lio Wehrly) 01/21/2018 0416    Radiological Exams on Admission: Ct Abdomen Pelvis Wo Contrast  Result Date: 01/21/2018 CLINICAL DATA:  Abdominal pain with nausea and vomiting, history of recent stent placement EXAM: CT ABDOMEN AND PELVIS WITHOUT CONTRAST TECHNIQUE: Multidetector CT imaging of the abdomen and pelvis was performed following the standard protocol without IV contrast. COMPARISON:  12/25/2017 FINDINGS: Lower chest: No acute abnormality. Hepatobiliary: No focal liver abnormality is seen. No gallstones, gallbladder [place] thickening, or biliary dilatation. Pancreas: Unremarkable. No pancreatic ductal  dilatation or surrounding inflammatory changes. Spleen: Normal in size without focal abnormality. Adrenals/Urinary Tract: Kidneys are well visualized bilaterally. Diffuse cortical thinning is noted on the left similar to that seen on the prior exam. Scattered renal cystic changes are noted. No definitive renal calculi are seen. Fullness of the right renal collecting system and proximal right ureter are again identified. Nashid Pellum right ureteral stent is noted in satisfactory position. The bladder is well distended. Stomach/Bowel: Diverticular change of the colon is noted without evidence of diverticulitis. The appendix is within normal limits. No obstructive or inflammatory changes are seen. Vascular/Lymphatic: Aortic atherosclerosis. No enlarged abdominal or pelvic lymph nodes. Jaquavis Felmlee few small retroperitoneal lymph nodes are identified not significant by size criteria. These are stable from the prior exam. Reproductive: Status post hysterectomy. Other: No abdominal Pippenger hernia or abnormality. No abdominopelvic ascites. Musculoskeletal: Degenerative changes of the lumbar spine are seen. No acute bony abnormality is noted. IMPRESSION: Right ureteral stent in satisfactory position. There remains some fullness of the right renal collecting system and proximal right ureter likely related to reflux from the bladder. The known ureteral lesion is not well appreciated. Chronic changes as described above. Electronically Signed   By: Inez Catalina M.D.   On: 01/21/2018 07:51   Dg Chest 2 View  Result Date: 01/21/2018 CLINICAL DATA:  Pain and nausea.  Hypertension. EXAM: CHEST - 2 VIEW COMPARISON:  January 25, 2015 FINDINGS: There is no edema or consolidation. Heart is upper normal in size with pulmonary vascularity normal. No adenopathy. Patient is status  post coronary artery bypass grafting. There is aortic atherosclerosis. There is thoracic dextroscoliosis. There is degenerative change throughout the thoracic spine. IMPRESSION: No  edema or consolidation. Heart size within normal limits. Status post coronary artery bypass grafting. There is aortic atherosclerosis. Aortic Atherosclerosis (ICD10-I70.0). Electronically Signed   By: Lowella Grip III M.D.   On: 01/21/2018 09:14    EKG: Independently reviewed. pending  Assessment/Plan Active Problems:   Acute kidney injury ([place])  Nausea  Vomiting  Diarrhea  Abdominal Pain:  Persistent abdominal discomfort predating recent stent placement.  She notes R sided abdominal pain, nausea, and diarrhea every time she eats.  CT with fullness of R renal collecting system and R ureteral stent in satisfactory position.  UA is consistent with possible UTI, but no bacteria on UA.  Of note, she was being worked up for this by Dr. Earlean Shawl her GI doctor who had gotten Dayln Tugwell RUQ [place] which showed biliary sludge without evidence of cholecystitis (they've apparently been told that she may need cholecystectomy).  Dr. Earlean Shawl had planned to evaluate with  EGD/colonoscopy.  She had negative C diff studies on 7/19.  Currently treating as possible UTI.  IVF Ceftriaxone for possible UTI Blood cx and urine cx Concern for dark stool, follow hemoccult Consider repeat RUQ [place] if not improving with abx  Acute Kidney Injury on CKD III: baseline creatinine ~1.4.Likely dehydration with above.  Follow with IVF.  Hold lasix and ARB.  Possible UTI:  UA without bacteria, but with RBC's and WBC's and + LE.  Follow urine culture.  Continue abx.  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  Follow H/H.  Iron panel, B12, folate.    CAD s/p CABG: ASA, coreg  Hypertension: amlodipine, coreg, irbesartan, clonidine, lasix  Hypothyroidism: synthroid  HFpEF: appears euvolemic, hold lasix  Polymyalgia Rheumatica: prednisone  GERD: PPI  T2DM: diet  controlled, follow A1c, SSI   DVT prophylaxis: SCD  Code Status: full  Family Communication: daughter at bedside  Disposition Plan: pending improvement  Consults called: none  Admission status: observation    Fayrene Helper MD [facility] Pager [phone]  If 7PM-7AM, please contact night-coverage [email or web address] Password [email or web address]  01/21/2018, 10:38 AM

## 2018-01-21 NOTE — ED Notes (Signed)
Patient to CT at this time

## 2018-01-21 NOTE — ED Notes (Signed)
Report given to Danielle, RN.  

## 2018-01-21 NOTE — ED Notes (Signed)
Patient to xray at this time

## 2018-01-21 NOTE — Care Management Obs Status (Signed)
[place] NOTIFICATION   Patient Details  Name: [name] MRN: [id number] Date of Birth: [birth year]   Medicare Observation Status Notification Given:  Yes    Leeroy Cha, RN 01/21/2018, 3:18 PM

## 2018-01-21 NOTE — H&P (Signed)
History and Physical    [name] IPJ:[id number] DOB: 1929-03-31 DOA: 01/21/2018  PCP: Hulan Fess, MD  Patient coming from: home  I have personally briefly reviewed patient's old medical records in [place]  Chief Complaint: r sided abdominal pain  HPI: [name] is [name] 84 y.o. female with medical history significant of coronary disease status post CABG, hypertension, reflux, polymyalgia rheumatica, meningioma presenting with persistent R sided abdominal pain.  [name] notes persistent abdominal pain since June.  She notes right-sided abdominal pain and pain across the top of her belly.  She notes that his pain did not really change after the stent placement.  She notes her symptoms got worse yesterday afternoon.  She noted diarrhea and dark stools.  She notes diarrhea just about after every time she eats.  In addition she noticed some nausea and vomiting as well.  She denies any dysuria or increased urinary frequency.  She denies any fevers, cough, chest pain, shortness of breath.  She does note some occasional chills.  She denies any recent antibiotics, but she was on antibiotics after the stent placement.  ED Course: Labs, CT, abx.  Admit to hospitalist.   Review of Systems: As per HPI otherwise 10 point review of systems negative.    Past Medical History:  Diagnosis Date  . Carotid artery occlusion    1-39% bilateral carotid stenosis by dopplers 05/2016  . Celiac sprue   . Coronary artery disease    s/p CABG with 50% stenosis at ostium of the SVG to dirst diagonal, patent LIMA to LAD and patent left circ with nonobstructive ASCAD of the RCA  . Diabetes mellitus    diet controlled  . Diverticulitis   . Gait disorder   . GERD (gastroesophageal reflux disease)   . Hyperchloremia   . Hyperlipidemia    statin intolerant  . Hypertension   . Meningioma ([place])   . Obesity   . Rosacea     Past Surgical History:  Procedure Laterality Date  . ABDOMINAL HYSTERECTOMY      . BRAIN TUMOR EXCISION  1993   meningioma, left frontal  . BUNIONECTOMY    . CARDIAC CATHETERIZATION    . CORONARY ARTERY BYPASS GRAFT  2002    3 vessel  . CYSTOSCOPY WITH RETROGRADE PYELOGRAM, URETEROSCOPY AND STENT PLACEMENT Right 12/14/2017   Procedure: CYSTOSCOPY WITH RIGHT RETROGRADE PYELOGRAM, RIGHT URETEROSCOPY AND RIGHT URETERAL STENT PLACEMENT, WASHINGS;  Surgeon: Franchot Gallo, MD;  Location: [facility];  Service: Urology;  Laterality: Right;  . JOINT REPLACEMENT  2009  . REPLACEMENT TOTAL KNEE    . ROTATOR CUFF REPAIR  2010   Dr. Gladstone Lighter  . TONSILLECTOMY       reports that she has never smoked. She has never used smokeless tobacco. She reports that she does not drink alcohol or use drugs.  Allergies  Allergen Reactions  . Accutane [Isotretinoin] Other (See Comments)    Affected liver  . Ciprofloxacin Rash and Other (See Comments)    Kidney failure  . Erythromycin Diarrhea and Rash  . Gluten Diarrhea and Other (See Comments)    Celiac sprue  . Gluten Meal Diarrhea and Rash    Celiac sprue intolerable  . Isotretinoin Other (See Comments)    Affects liver  . Lipitor [Atorvastatin] Other (See Comments)    Affects legs  . Lisinopril Other (See Comments)    Pt doesn't remember reaction   . Lovastatin Other (See Comments)    Affects legs  .  Penicillins Other (See Comments)    From childhood Has patient had Anaia Frith PCN reaction causing immediate rash, facial/tongue/throat swelling, SOB or lightheadedness with hypotension: Unknown Has patient had Kynzley Dowson PCN reaction causing severe rash involving mucus membranes or skin necrosis: Unknown Has patient had Farrell Broerman PCN reaction that required hospitalization: Unknown Has patient had Dayelin Balducci PCN reaction occurring within the last 10 years: No If all of the above answers are "NO", then may proceed with Cephalosporin use.  Marland Kitchen Phenytoin Sodium Extended Other (See Comments)    Affects liver  . Pravastatin Other (See Comments)    Affected legs  .  Crestor [Rosuvastatin]     Muscle aches even on 5mg  once weekly  . Gabapentin Other (See Comments)    Shaky, swollen legs  . Hydrocodone Other (See Comments)    Restless leg  . Metoprolol Itching and Swelling  . Prolia [Denosumab] Other (See Comments)    Joints and muscle aches, loss of hair  . Boniva [Ibandronic Acid] Other (See Comments)    headaches  . Clarithromycin Rash  . Hydralazine Rash    Pt does not know what the reactions were.  Marland Kitchen Lescol [Fluvastatin Sodium] Other (See Comments)    Affects legs  . Sulfa Antibiotics Rash  . Sulfasalazine Rash  . Welchol [Colesevelam Hcl] Other (See Comments)    No energy  . Zetia [Ezetimibe] Other (See Comments)    myalgias  . Zithromax [Azithromycin] Rash    Family History  Problem Relation Age of Onset  . Diabetes Mother   . Heart disease Brother   . Diabetes Sister   . Diabetes Sister    Prior to Admission medications   Medication Sig Start Date End Date Taking? Authorizing Provider  amLODipine (NORVASC) 10 MG tablet Take 1 tablet (10 mg total) by mouth daily. 08/20/17 08/15/18 Yes Turner, Eber Hong, MD  Ascorbic Acid (VITAMIN C PO) Take 1 tablet by mouth daily at 3 pm.   Yes [provider]  aspirin EC 81 MG tablet Take 81 mg by mouth daily at 3 pm.   Yes [provider]  azelastine (ASTELIN) 0.1 % nasal spray Place 1 spray into both nostrils 2 (two) times daily. Use in each nostril as directed   Yes [provider]  Biotin 5000 MCG TABS Take 5,000 mcg by mouth once Philbert Ocallaghan week. On Tuesday   Yes [provider]  Calcium Citrate (CITRACAL PO) Take 1 tablet by mouth daily at 3 pm.    Yes [provider]  carvedilol (COREG) 3.125 MG tablet Take 1.5625 mg by mouth 2 (two) times daily.  11/06/17  Yes [provider]  Cholecalciferol 4000 UNITS CAPS Take 4,000 Units by mouth daily at 3 pm.   Yes [provider]  cloNIDine (CATAPRES) 0.1 MG tablet Take 0.1 mg by mouth 2 (two) times  daily.   Yes [provider]  furosemide (LASIX) 40 MG tablet Take 1 tablet (40 mg total) by mouth daily. Morning and mid evening Patient taking differently: Take 40 mg by mouth 2 (two) times daily. Morning and mid evening 01/07/17  Yes Turner, Traci R, MD  irbesartan (AVAPRO) 300 MG tablet Take 300 mg by mouth daily.   Yes [provider]  Levothyroxine Sodium 50 MCG CAPS Take 1 capsule by mouth daily.    Yes [provider]  METRONIDAZOLE, TOPICAL, 0.75 % LOTN Apply 1 application topically at bedtime as needed (rosacea).   Yes [provider]  Multiple Vitamin (MULITIVITAMIN WITH  MINERALS) TABS Take 1 tablet by mouth daily at 3 pm.    Yes [provider]  ondansetron (ZOFRAN) 4 MG tablet Take 4 mg by mouth every 8 (eight) hours as needed for nausea. for nausea 12/22/17  Yes [provider]  pantoprazole (PROTONIX) 40 MG tablet Take 40 mg by mouth daily before breakfast.   Yes [provider]  potassium chloride (KLOR-CON) 8 MEQ tablet Take 8 mEq by mouth daily.  04/26/14  Yes [provider]  predniSONE (DELTASONE) 1 MG tablet Take 3 mg by mouth daily. Take with 5 mg tablet   Yes [provider]  predniSONE (DELTASONE) 5 MG tablet Take 5 mg by mouth daily. Take with 1 mg tablets   Yes [provider]  pyridOXINE (VITAMIN B-6) 100 MG tablet Take 100 mg by mouth once Elmer Merwin week. On Thursday   Yes [provider]  sulfamethoxazole-trimethoprim (BACTRIM DS,SEPTRA DS) 800-160 MG tablet Take 1 tablet by mouth 2 (two) times daily. 01/20/18  Yes [provider]  traMADol (ULTRAM) 50 MG tablet Take 50 mg by mouth every 4 (four) hours as needed for moderate pain.  12/24/17  Yes [provider]  vitamin B-12 (CYANOCOBALAMIN) 500 MCG tablet Take 500 mcg by mouth once Yuvonne Lanahan week. On Thursdays   Yes [provider]    Physical Exam: Vitals:   01/20/18 2307 01/21/18 0546 01/21/18 0600 01/21/18 0800    BP:  (!) 154/66 (!) 154/57 (!) 163/60  Pulse:  70 66 76  Resp:  18 16 15   Temp:      TempSrc:      SpO2:  98% 96% 90%  Weight: 79.8 kg (176 lb)     Height: 5\' 4"  (1.626 m)       Constitutional: NAD, calm, comfortable Vitals:   01/20/18 2307 01/21/18 0546 01/21/18 0600 01/21/18 0800  BP:  (!) 154/66 (!) 154/57 (!) 163/60  Pulse:  70 66 76  Resp:  18 16 15   Temp:      TempSrc:      SpO2:  98% 96% 90%  Weight: 79.8 kg (176 lb)     Height: 5\' 4"  (1.626 m)      Eyes: PERRL, lids and conjunctivae normal ENMT: Mucous membranes are moist. Posterior pharynx clear of any exudate or lesions.Normal dentition.  Neck: normal, supple, no masses, no thyromegaly Respiratory: clear to auscultation bilaterally, no wheezing, no crackles. Normal respiratory effort. No accessory muscle use.  Cardiovascular: Regular rate and rhythm, no murmurs / rubs / gallops. No extremity edema. Abdomen: R sided abdominal TTP, no rebound or guarding, no masses palpated. No hepatosplenomegaly. Bowel sounds positive.  Musculoskeletal: no clubbing / cyanosis. No joint deformity upper and lower extremities. Good ROM, no contractures. Normal muscle tone.  Skin: no rashes, lesions, ulcers. No induration Neurologic: CN 2-12 grossly intact. Sensation intact. Moving all extremities equally. Psychiatric: Normal judgment and insight. Alert and oriented x 3. Normal mood.   Labs on Admission: I have personally reviewed following labs and imaging studies  CBC: Recent Labs  Lab 01/21/18 0630  WBC 16.3*  NEUTROABS 6.5  HGB 10.2*  HCT 32.5*  MCV 78.1  PLT 845   Basic Metabolic Panel: Recent Labs  Lab 01/21/18 0630  NA 136  K 3.9  CL 103  CO2 22  GLUCOSE 101*  BUN 37*  CREATININE 2.10*  CALCIUM 8.2*   GFR: Estimated Creatinine Clearance: 19.3 mL/min (Baruch Lewers) (by C-G formula based on SCr of 2.1 mg/dL (H)).  Liver Function Tests: Recent Labs  Lab 01/21/18 0630  AST 15  ALT 13  ALKPHOS 78  BILITOT 0.9  PROT  5.9*  ALBUMIN 2.7*   Recent Labs  Lab 01/21/18 0630  LIPASE 30   No results for input(s): AMMONIA in the last 168 hours. Coagulation Profile: No results for input(s): INR, PROTIME in the last 168 hours. Cardiac Enzymes: No results for input(s): CKTOTAL, CKMB, CKMBINDEX, TROPONINI in the last 168 hours. BNP (last 3 results) No results for input(s): PROBNP in the last 8760 hours. HbA1C: No results for input(s): HGBA1C in the last 72 hours. CBG: No results for input(s): GLUCAP in the last 168 hours. Lipid Profile: No results for input(s): CHOL, HDL, LDLCALC, TRIG, CHOLHDL, LDLDIRECT in the last 72 hours. Thyroid Function Tests: No results for input(s): TSH, T4TOTAL, FREET4, T3FREE, THYROIDAB in the last 72 hours. Anemia Panel: No results for input(s): VITAMINB12, FOLATE, FERRITIN, TIBC, IRON, RETICCTPCT in the last 72 hours. Urine analysis:    Component Value Date/Time   COLORURINE YELLOW 01/21/2018 0416   APPEARANCEUR HAZY (Skyleen Bentley) 01/21/2018 0416   LABSPEC 1.010 01/21/2018 0416   PHURINE 6.0 01/21/2018 0416   GLUCOSEU 50 (Sanela Evola) 01/21/2018 0416   HGBUR LARGE (Rylynn Kobs) 01/21/2018 0416   BILIRUBINUR NEGATIVE 01/21/2018 0416   KETONESUR NEGATIVE 01/21/2018 0416   PROTEINUR 100 (Dariah Mcsorley) 01/21/2018 0416   UROBILINOGEN 0.2 07/08/2010 1238   NITRITE NEGATIVE 01/21/2018 0416   LEUKOCYTESUR LARGE (Blanchard Willhite) 01/21/2018 0416    Radiological Exams on Admission: Ct Abdomen Pelvis Wo Contrast  Result Date: 01/21/2018 CLINICAL DATA:  Abdominal pain with nausea and vomiting, history of recent stent placement EXAM: CT ABDOMEN AND PELVIS WITHOUT CONTRAST TECHNIQUE: Multidetector CT imaging of the abdomen and pelvis was performed following the standard protocol without IV contrast. COMPARISON:  12/25/2017 FINDINGS: Lower chest: No acute abnormality. Hepatobiliary: No focal liver abnormality is seen. No gallstones, gallbladder Garant thickening, or biliary dilatation. Pancreas: Unremarkable. No pancreatic ductal  dilatation or surrounding inflammatory changes. Spleen: Normal in size without focal abnormality. Adrenals/Urinary Tract: Kidneys are well visualized bilaterally. Diffuse cortical thinning is noted on the left similar to that seen on the prior exam. Scattered renal cystic changes are noted. No definitive renal calculi are seen. Fullness of the right renal collecting system and proximal right ureter are again identified. Bunyan Brier right ureteral stent is noted in satisfactory position. The bladder is well distended. Stomach/Bowel: Diverticular change of the colon is noted without evidence of diverticulitis. The appendix is within normal limits. No obstructive or inflammatory changes are seen. Vascular/Lymphatic: Aortic atherosclerosis. No enlarged abdominal or pelvic lymph nodes. Marcial Pless few small retroperitoneal lymph nodes are identified not significant by size criteria. These are stable from the prior exam. Reproductive: Status post hysterectomy. Other: No abdominal July hernia or abnormality. No abdominopelvic ascites. Musculoskeletal: Degenerative changes of the lumbar spine are seen. No acute bony abnormality is noted. IMPRESSION: Right ureteral stent in satisfactory position. There remains some fullness of the right renal collecting system and proximal right ureter likely related to reflux from the bladder. The known ureteral lesion is not well appreciated. Chronic changes as described above. Electronically Signed   By: Inez Catalina M.D.   On: 01/21/2018 07:51   Dg Chest 2 View  Result Date: 01/21/2018 CLINICAL DATA:  Pain and nausea.  Hypertension. EXAM: CHEST - 2 VIEW COMPARISON:  January 25, 2015 FINDINGS: There is no edema or consolidation. Heart is upper normal in size with pulmonary vascularity normal. No adenopathy. Patient is status  post coronary artery bypass grafting. There is aortic atherosclerosis. There is thoracic dextroscoliosis. There is degenerative change throughout the thoracic spine. IMPRESSION: No  edema or consolidation. Heart size within normal limits. Status post coronary artery bypass grafting. There is aortic atherosclerosis. Aortic Atherosclerosis (ICD10-I70.0). Electronically Signed   By: Lowella Grip III M.D.   On: 01/21/2018 09:14    EKG: Independently reviewed. pending  Assessment/Plan Active Problems:   Acute kidney injury ([place])  Nausea  Vomiting  Diarrhea  Abdominal Pain:  Persistent abdominal discomfort predating recent stent placement.  She notes R sided abdominal pain, nausea, and diarrhea every time she eats.  CT with fullness of R renal collecting system and R ureteral stent in satisfactory position.  UA is consistent with possible UTI, but no bacteria on UA.  Of note, she was being worked up for this by Dr. Earlean Shawl her GI doctor who had gotten Ezariah Nace RUQ [place] which showed biliary sludge without evidence of cholecystitis (they've apparently been told that she may need cholecystectomy).  Dr. Earlean Shawl had planned to evaluate with  EGD/colonoscopy.  She had negative C diff studies on 7/19.  Currently treating as possible UTI.  IVF Ceftriaxone for possible UTI Blood cx and urine cx Concern for dark stool, follow hemoccult Consider repeat RUQ [place] if not improving with abx  Acute Kidney Injury on CKD III: baseline creatinine ~1.4.Likely dehydration with above.  Follow with IVF.  Hold lasix and ARB. Would discuss with urology if not improving   Possible UTI:  UA without bacteria, but with RBC's and WBC's and + LE.  Follow urine culture.  Continue abx.  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  Follow H/H.  Iron panel, B12, folate.    CAD s/p CABG: ASA, coreg  Hypertension: amlodipine, coreg, irbesartan, clonidine, lasix  Hypothyroidism: synthroid  HFpEF: appears euvolemic, hold lasix  Polymyalgia  Rheumatica: prednisone  GERD: PPI  T2DM: diet controlled, follow A1c, SSI   DVT prophylaxis: SCD  Code Status: full  Family Communication: daughter at bedside  Disposition Plan: pending improvement  Consults called: none  Admission status: observation    Fayrene Helper MD [facility] Pager [phone]  If 7PM-7AM, please contact night-coverage [email or web address] Password [email or web address]  01/21/2018, 10:38 AM

## 2018-01-21 NOTE — ED Notes (Signed)
ED TO INPATIENT HANDOFF REPORT  Name/Age/Gender [name] 84 y.o. female  Code Status Code Status History    Date Active Date Inactive Code Status Order ID Comments User Context   12/13/2017 2004 12/15/2017 1427 Full Code [id number]  Franchot Gallo, MD Inpatient   08/05/2011 0500 08/06/2011 1750 DNR [id number]  Toy Baker, MD Inpatient      Home/SNF/Other Home  Chief Complaint NVD; Abdominal Pain  Level of Care/Admitting Diagnosis ED Disposition    ED Disposition Condition [place]: [facility] [[id number]]  Level of Care: Med-Surg [16]  Diagnosis: Acute kidney injury [facility]) [[id number]]  Admitting Physician: Elodia Florence [phone]  Attending Physician: Cephus Slater, A CALDWELL [phone]  PT Class (Do Not Modify): Observation [104]  PT Acc Code (Do Not Modify): Observation [10022]       Medical History Past Medical History:  Diagnosis Date  . Carotid artery occlusion    1-39% bilateral carotid stenosis by dopplers 05/2016  . Celiac sprue   . Coronary artery disease    s/p CABG with 50% stenosis at ostium of the SVG to dirst diagonal, patent LIMA to LAD and patent left circ with nonobstructive ASCAD of the RCA  . Diabetes mellitus    diet controlled  . Diverticulitis   . Gait disorder   . GERD (gastroesophageal reflux disease)   . Hyperchloremia   . Hyperlipidemia    statin intolerant  . Hypertension   . Meningioma ([place])   . Obesity   . Rosacea     Allergies Allergies  Allergen Reactions  . Accutane [Isotretinoin] Other (See Comments)    Affected liver  . Ciprofloxacin Rash and Other (See Comments)    Kidney failure  . Erythromycin Diarrhea and Rash  . Gluten Diarrhea and Other (See Comments)    Celiac sprue  . Gluten Meal Diarrhea and Rash    Celiac sprue intolerable  . Isotretinoin Other (See Comments)    Affects liver  . Lipitor [Atorvastatin] Other (See Comments)    Affects legs  . Lisinopril  Other (See Comments)    Pt doesn't remember reaction   . Lovastatin Other (See Comments)    Affects legs  . Penicillins Other (See Comments)    From childhood Has patient had a PCN reaction causing immediate rash, facial/tongue/throat swelling, SOB or lightheadedness with hypotension: Unknown Has patient had a PCN reaction causing severe rash involving mucus membranes or skin necrosis: Unknown Has patient had a PCN reaction that required hospitalization: Unknown Has patient had a PCN reaction occurring within the last 10 years: No If all of the above answers are "NO", then may proceed with Cephalosporin use.  Marland Kitchen Phenytoin Sodium Extended Other (See Comments)    Affects liver  . Pravastatin Other (See Comments)    Affected legs  . Crestor [Rosuvastatin]     Muscle aches even on '5mg'$  once weekly  . Gabapentin Other (See Comments)    Shaky, swollen legs  . Hydrocodone Other (See Comments)    Restless leg  . Metoprolol Itching and Swelling  . Prolia [Denosumab] Other (See Comments)    Joints and muscle aches, loss of hair  . Boniva [Ibandronic Acid] Other (See Comments)    headaches  . Clarithromycin Rash  . Hydralazine Rash    Pt does not know what the reactions were.  Marland Kitchen Lescol [Fluvastatin Sodium] Other (See Comments)    Affects legs  . Sulfa Antibiotics Rash  . Sulfasalazine Rash  .  Welchol [Colesevelam Hcl] Other (See Comments)    No energy  . Zetia [Ezetimibe] Other (See Comments)    myalgias  . Zithromax [Azithromycin] Rash    IV Location/Drains/Wounds Patient Lines/Drains/Airways Status   Active Line/Drains/Airways    Name:   Placement date:   Placement time:   Site:   Days:   Ureteral Drain/Stent Right ureter 6 Fr.   12/14/17    0915    Right ureter   38          Labs/Imaging Results for orders placed or performed during the hospital encounter of 01/21/18 (from the past 48 hour(s))  Urinalysis, Routine w reflex microscopic     Status: Abnormal   Collection  Time: 01/21/18  4:16 AM  Result Value Ref Range   Color, Urine YELLOW YELLOW   APPearance HAZY (A) CLEAR   Specific Gravity, Urine 1.010 1.005 - 1.030   pH 6.0 5.0 - 8.0   Glucose, UA 50 (A) NEGATIVE mg/dL   Hgb urine dipstick LARGE (A) NEGATIVE   Bilirubin Urine NEGATIVE NEGATIVE   Ketones, ur NEGATIVE NEGATIVE mg/dL   Protein, ur 100 (A) NEGATIVE mg/dL   Nitrite NEGATIVE NEGATIVE   Leukocytes, UA LARGE (A) NEGATIVE   RBC / HPF >50 (H) 0 - 5 RBC/hpf   WBC, UA >50 (H) 0 - 5 WBC/hpf   Bacteria, UA NONE SEEN NONE SEEN   Squamous Epithelial / LPF 0-5 0 - 5   Mucus PRESENT    Hyaline Casts, UA PRESENT     Comment: Performed at [facility], [place] [address] [place], [place] [address]  Comprehensive metabolic panel     Status: Abnormal   Collection Time: 01/21/18  6:30 AM  Result Value Ref Range   Sodium 136 135 - 145 mmol/L   Potassium 3.9 3.5 - 5.1 mmol/L   Chloride 103 98 - 111 mmol/L   CO2 22 22 - 32 mmol/L   Glucose, Bld 101 (H) 70 - 99 mg/dL   BUN 37 (H) 8 - 23 mg/dL   Creatinine, Ser 2.10 (H) 0.44 - 1.00 mg/dL   Calcium 8.2 (L) 8.9 - 10.3 mg/dL   Total Protein 5.9 (L) 6.5 - 8.1 g/dL   Albumin 2.7 (L) 3.5 - 5.0 g/dL   AST 15 15 - 41 U/L   ALT 13 0 - 44 U/L   Alkaline Phosphatase 78 38 - 126 U/L   Total Bilirubin 0.9 0.3 - 1.2 mg/dL   GFR calc non Af Amer 20 (L) >60 mL/min   GFR calc Af Amer 23 (L) >60 mL/min    Comment: (NOTE) The eGFR has been calculated using the CKD EPI equation. This calculation has not been validated in all clinical situations. eGFR's persistently <60 mL/min signify possible Chronic Kidney Disease.    Anion gap 11 5 - 15    Comment: Performed at [facility], [place] [address], [place], [place] [address]  Lipase, blood     Status: None   Collection Time: 01/21/18  6:30 AM  Result Value Ref Range   Lipase 30 11 - 51 U/L    Comment: Performed at [facility], [place] [address] [place],  [place] [address]  CBC with Differential     Status: Abnormal   Collection Time: 01/21/18  6:30 AM  Result Value Ref Range   WBC 16.3 (H) 4.0 - 10.5 K/uL   RBC 4.16 3.87 - 5.11 MIL/uL   Hemoglobin 10.2 (L) 12.0 -  15.0 g/dL   HCT 32.5 (L) 36.0 - 46.0 %   MCV 78.1 78.0 - 100.0 fL   MCH 24.5 (L) 26.0 - 34.0 pg   MCHC 31.4 30.0 - 36.0 g/dL   RDW 19.4 (H) 11.5 - 15.5 %   Platelets 384 150 - 400 K/uL   Neutrophils Relative % 40 %   Lymphocytes Relative 54 %   Monocytes Relative 4 %   Eosinophils Relative 2 %   Basophils Relative 0 %   Neutro Abs 6.5 1.7 - 7.7 K/uL   Lymphs Abs 8.8 (H) 0.7 - 4.0 K/uL   Monocytes Absolute 0.7 0.1 - 1.0 K/uL   Eosinophils Absolute 0.3 0.0 - 0.7 K/uL   Basophils Absolute 0.0 0.0 - 0.1 K/uL   WBC Morphology WHITE COUNT CONFIRMED ON SMEAR     Comment: Performed at [facility], [place] [address], [place], [place] [address]   Ct Abdomen Pelvis Wo Contrast  Result Date: 01/21/2018 CLINICAL DATA:  Abdominal pain with nausea and vomiting, history of recent stent placement EXAM: CT ABDOMEN AND PELVIS WITHOUT CONTRAST TECHNIQUE: Multidetector CT imaging of the abdomen and pelvis was performed following the standard protocol without IV contrast. COMPARISON:  12/25/2017 FINDINGS: Lower chest: No acute abnormality. Hepatobiliary: No focal liver abnormality is seen. No gallstones, gallbladder Sweeten thickening, or biliary dilatation. Pancreas: Unremarkable. No pancreatic ductal dilatation or surrounding inflammatory changes. Spleen: Normal in size without focal abnormality. Adrenals/Urinary Tract: Kidneys are well visualized bilaterally. Diffuse cortical thinning is noted on the left similar to that seen on the prior exam. Scattered renal cystic changes are noted. No definitive renal calculi are seen. Fullness of the right renal collecting system and proximal right ureter are again identified. A right ureteral stent is noted in satisfactory position. The bladder is well  distended. Stomach/Bowel: Diverticular change of the colon is noted without evidence of diverticulitis. The appendix is within normal limits. No obstructive or inflammatory changes are seen. Vascular/Lymphatic: Aortic atherosclerosis. No enlarged abdominal or pelvic lymph nodes. A few small retroperitoneal lymph nodes are identified not significant by size criteria. These are stable from the prior exam. Reproductive: Status post hysterectomy. Other: No abdominal Saline hernia or abnormality. No abdominopelvic ascites. Musculoskeletal: Degenerative changes of the lumbar spine are seen. No acute bony abnormality is noted. IMPRESSION: Right ureteral stent in satisfactory position. There remains some fullness of the right renal collecting system and proximal right ureter likely related to reflux from the bladder. The known ureteral lesion is not well appreciated. Chronic changes as described above. Electronically Signed   By: Inez Catalina M.D.   On: 01/21/2018 07:51   Dg Chest 2 View  Result Date: 01/21/2018 CLINICAL DATA:  Pain and nausea.  Hypertension. EXAM: CHEST - 2 VIEW COMPARISON:  January 25, 2015 FINDINGS: There is no edema or consolidation. Heart is upper normal in size with pulmonary vascularity normal. No adenopathy. Patient is status post coronary artery bypass grafting. There is aortic atherosclerosis. There is thoracic dextroscoliosis. There is degenerative change throughout the thoracic spine. IMPRESSION: No edema or consolidation. Heart size within normal limits. Status post coronary artery bypass grafting. There is aortic atherosclerosis. Aortic Atherosclerosis (ICD10-I70.0). Electronically Signed   By: Lowella Grip III M.D.   On: 01/21/2018 09:14    Pending Labs Unresulted Labs (From admission, onward)   Start     Ordered   01/21/18 0824  Culture, Urine  Once,   R     01/21/18 3295   01/21/18 1884  Culture, blood (routine x 2)  BLOOD CULTURE X 2,   R     01/21/18 0823   01/21/18 0630   Pathologist smear review  Once,   R     01/21/18 0630   Signed and Held  Comprehensive metabolic panel  Tomorrow morning,   R     Signed and Held   Signed and Held  CBC  Tomorrow morning,   R     Signed and Held   Signed and Held  Magnesium  Tomorrow morning,   R     Signed and Held      Vitals/Pain Today's Vitals   01/21/18 0600 01/21/18 0633 01/21/18 0800 01/21/18 0943  BP: (!) 154/57  (!) 163/60   Pulse: 66  76   Resp: 16  15   Temp:      TempSrc:      SpO2: 96%  90%   Weight:      Height:      PainSc:  3   6     Isolation Precautions No active isolations  Medications Medications  sodium chloride 0.9 % bolus 1,000 mL (0 mLs Intravenous Stopping Infusion hung by another clincian 01/21/18 0700)  morphine 4 MG/ML injection 4 mg (4 mg Intravenous Given 01/21/18 0543)  ondansetron (ZOFRAN) injection 4 mg (4 mg Intravenous Given 01/21/18 0543)  diphenhydrAMINE (BENADRYL) injection 12.5 mg (12.5 mg Intravenous Given 01/21/18 0725)  cefTRIAXone (ROCEPHIN) 1 g in sodium chloride 0.9 % 100 mL IVPB (0 g Intravenous Stopped 01/21/18 0850)  HYDROmorphone (DILAUDID) injection 1 mg (1 mg Intravenous Given 01/21/18 0826)    Mobility walks with person assist

## 2018-01-22 DIAGNOSIS — E1122 Type 2 diabetes mellitus with diabetic chronic kidney disease: Secondary | ICD-10-CM | POA: Diagnosis present

## 2018-01-22 DIAGNOSIS — K5790 Diverticulosis of intestine, part unspecified, without perforation or abscess without bleeding: Secondary | ICD-10-CM | POA: Diagnosis not present

## 2018-01-22 DIAGNOSIS — R1031 Right lower quadrant pain: Secondary | ICD-10-CM

## 2018-01-22 DIAGNOSIS — K449 Diaphragmatic hernia without obstruction or gangrene: Secondary | ICD-10-CM | POA: Diagnosis present

## 2018-01-22 DIAGNOSIS — N179 Acute kidney failure, unspecified: Secondary | ICD-10-CM | POA: Diagnosis present

## 2018-01-22 DIAGNOSIS — K219 Gastro-esophageal reflux disease without esophagitis: Secondary | ICD-10-CM | POA: Diagnosis present

## 2018-01-22 DIAGNOSIS — E039 Hypothyroidism, unspecified: Secondary | ICD-10-CM | POA: Diagnosis present

## 2018-01-22 DIAGNOSIS — K2900 Acute gastritis without bleeding: Secondary | ICD-10-CM | POA: Diagnosis present

## 2018-01-22 DIAGNOSIS — R112 Nausea with vomiting, unspecified: Secondary | ICD-10-CM | POA: Diagnosis not present

## 2018-01-22 DIAGNOSIS — N132 Hydronephrosis with renal and ureteral calculous obstruction: Secondary | ICD-10-CM | POA: Diagnosis not present

## 2018-01-22 DIAGNOSIS — I5032 Chronic diastolic (congestive) heart failure: Secondary | ICD-10-CM | POA: Diagnosis present

## 2018-01-22 DIAGNOSIS — K222 Esophageal obstruction: Secondary | ICD-10-CM | POA: Diagnosis present

## 2018-01-22 DIAGNOSIS — N183 Chronic kidney disease, stage 3 (moderate): Secondary | ICD-10-CM | POA: Diagnosis present

## 2018-01-22 DIAGNOSIS — N3000 Acute cystitis without hematuria: Secondary | ICD-10-CM

## 2018-01-22 DIAGNOSIS — R030 Elevated blood-pressure reading, without diagnosis of hypertension: Secondary | ICD-10-CM | POA: Diagnosis not present

## 2018-01-22 DIAGNOSIS — M353 Polymyalgia rheumatica: Secondary | ICD-10-CM | POA: Diagnosis present

## 2018-01-22 DIAGNOSIS — Z885 Allergy status to narcotic agent status: Secondary | ICD-10-CM | POA: Diagnosis not present

## 2018-01-22 DIAGNOSIS — Z951 Presence of aortocoronary bypass graft: Secondary | ICD-10-CM | POA: Diagnosis not present

## 2018-01-22 DIAGNOSIS — Z86011 Personal history of benign neoplasm of the brain: Secondary | ICD-10-CM | POA: Diagnosis not present

## 2018-01-22 DIAGNOSIS — N136 Pyonephrosis: Secondary | ICD-10-CM | POA: Diagnosis present

## 2018-01-22 DIAGNOSIS — I10 Essential (primary) hypertension: Secondary | ICD-10-CM | POA: Diagnosis not present

## 2018-01-22 DIAGNOSIS — N200 Calculus of kidney: Secondary | ICD-10-CM | POA: Diagnosis not present

## 2018-01-22 DIAGNOSIS — K640 First degree hemorrhoids: Secondary | ICD-10-CM | POA: Diagnosis present

## 2018-01-22 DIAGNOSIS — I2510 Atherosclerotic heart disease of native coronary artery without angina pectoris: Secondary | ICD-10-CM | POA: Diagnosis present

## 2018-01-22 DIAGNOSIS — Z882 Allergy status to sulfonamides status: Secondary | ICD-10-CM | POA: Diagnosis not present

## 2018-01-22 DIAGNOSIS — K5730 Diverticulosis of large intestine without perforation or abscess without bleeding: Secondary | ICD-10-CM | POA: Diagnosis present

## 2018-01-22 DIAGNOSIS — Z7952 Long term (current) use of systemic steroids: Secondary | ICD-10-CM | POA: Diagnosis not present

## 2018-01-22 DIAGNOSIS — D631 Anemia in chronic kidney disease: Secondary | ICD-10-CM | POA: Diagnosis present

## 2018-01-22 DIAGNOSIS — Z888 Allergy status to other drugs, medicaments and biological substances status: Secondary | ICD-10-CM | POA: Diagnosis not present

## 2018-01-22 DIAGNOSIS — I130 Hypertensive heart and chronic kidney disease with heart failure and stage 1 through stage 4 chronic kidney disease, or unspecified chronic kidney disease: Secondary | ICD-10-CM | POA: Diagnosis present

## 2018-01-22 DIAGNOSIS — D72829 Elevated white blood cell count, unspecified: Secondary | ICD-10-CM | POA: Diagnosis not present

## 2018-01-22 DIAGNOSIS — R1084 Generalized abdominal pain: Secondary | ICD-10-CM | POA: Diagnosis not present

## 2018-01-22 DIAGNOSIS — E1151 Type 2 diabetes mellitus with diabetic peripheral angiopathy without gangrene: Secondary | ICD-10-CM | POA: Diagnosis not present

## 2018-01-22 DIAGNOSIS — D509 Iron deficiency anemia, unspecified: Secondary | ICD-10-CM | POA: Diagnosis not present

## 2018-01-22 DIAGNOSIS — Z881 Allergy status to other antibiotic agents status: Secondary | ICD-10-CM | POA: Diagnosis not present

## 2018-01-22 DIAGNOSIS — Z880 Allergy status to penicillin: Secondary | ICD-10-CM | POA: Diagnosis not present

## 2018-01-22 DIAGNOSIS — K2970 Gastritis, unspecified, without bleeding: Secondary | ICD-10-CM | POA: Diagnosis not present

## 2018-01-22 LAB — COMPREHENSIVE METABOLIC PANEL
ALT: 14 U/L (ref 0–44)
ANION GAP: 11 (ref 5–15)
AST: 18 U/L (ref 15–41)
Albumin: 2.8 g/dL — ABNORMAL LOW (ref 3.5–5.0)
Alkaline Phosphatase: 86 U/L (ref 38–126)
BUN: 31 mg/dL — ABNORMAL HIGH (ref 8–23)
CO2: 24 mmol/L (ref 22–32)
CREATININE: 1.73 mg/dL — AB (ref 0.44–1.00)
Calcium: 8.7 mg/dL — ABNORMAL LOW (ref 8.9–10.3)
Chloride: 101 mmol/L (ref 98–111)
GFR calc non Af Amer: 25 mL/min — ABNORMAL LOW (ref >60)
GFR, EST AFRICAN AMERICAN: 29 mL/min — AB (ref >60)
Glucose, Bld: 100 mg/dL — ABNORMAL HIGH (ref 70–99)
Potassium: 3.9 mmol/L (ref 3.5–5.1)
SODIUM: 136 mmol/L (ref 135–145)
Total Bilirubin: 0.9 mg/dL (ref 0.3–1.2)
Total Protein: 6.4 g/dL — ABNORMAL LOW (ref 6.5–8.1)

## 2018-01-22 LAB — IRON AND TIBC
Iron: 21 ug/dL — ABNORMAL LOW (ref 28–170)
SATURATION RATIOS: 7 % — AB (ref 10.4–31.8)
TIBC: 314 ug/dL (ref 250–450)
UIBC: 293 ug/dL

## 2018-01-22 LAB — CBC
HCT: 33.7 % — ABNORMAL LOW (ref 36.0–46.0)
HEMOGLOBIN: 10.3 g/dL — AB (ref 12.0–15.0)
MCH: 23.7 pg — AB (ref 26.0–34.0)
MCHC: 30.6 g/dL (ref 30.0–36.0)
MCV: 77.5 fL — ABNORMAL LOW (ref 78.0–100.0)
Platelets: 411 10*3/uL — ABNORMAL HIGH (ref 150–400)
RBC: 4.35 MIL/uL (ref 3.87–5.11)
RDW: 18.7 % — ABNORMAL HIGH (ref 11.5–15.5)
WBC: 15.2 10*3/uL — ABNORMAL HIGH (ref 4.0–10.5)

## 2018-01-22 LAB — FOLATE: FOLATE: 21.8 ng/mL (ref >5.9)

## 2018-01-22 LAB — MAGNESIUM: Magnesium: 2.1 mg/dL (ref 1.7–2.4)

## 2018-01-22 LAB — VITAMIN B12: VITAMIN B 12: 560 pg/mL (ref 180–914)

## 2018-01-22 LAB — GLUCOSE, CAPILLARY
GLUCOSE-CAPILLARY: 128 mg/dL — AB (ref 70–99)
Glucose-Capillary: 128 mg/dL — ABNORMAL HIGH (ref 70–99)
Glucose-Capillary: 90 mg/dL (ref 70–99)
Glucose-Capillary: 108 mg/dL — ABNORMAL HIGH (ref 70–99)
Glucose-Capillary: 126 mg/dL — ABNORMAL HIGH (ref 70–99)
Glucose-Capillary: 175 mg/dL — ABNORMAL HIGH (ref 70–99)

## 2018-01-22 LAB — C-REACTIVE PROTEIN: CRP: 5.6 mg/dL — AB (ref <1.0)

## 2018-01-22 LAB — FERRITIN: FERRITIN: 16 ng/mL (ref 11–307)

## 2018-01-22 LAB — URINE CULTURE: Culture: 60000 — AB

## 2018-01-22 MED ORDER — PEG 3350-KCL-NA BICARB-NACL 420 G PO SOLR
4000.0000 mL | Freq: Once | ORAL | Status: AC
  Administered 2018-01-23: 4000 mL via ORAL

## 2018-01-22 MED ORDER — SODIUM CHLORIDE 0.9 % IV SOLN: INTRAVENOUS | Status: DC

## 2018-01-22 MED ORDER — PREDNISONE 5 MG PO TABS
8.0000 mg | ORAL_TABLET | Freq: Every day | ORAL | Status: DC
  Administered 2018-01-22 – 2018-01-25 (×4): 8 mg via ORAL
  Filled 2018-01-22 (×4): qty 3

## 2018-01-22 MED ORDER — TRAMADOL HCL 50 MG PO TABS
50.0000 mg | ORAL_TABLET | Freq: Four times a day (QID) | ORAL | Status: DC | PRN
  Administered 2018-01-22 – 2018-01-25 (×6): 50 mg via ORAL
  Filled 2018-01-22 (×6): qty 1

## 2018-01-22 MED ORDER — SODIUM CHLORIDE 0.9 % IV SOLN
INTRAVENOUS | Status: DC
  Administered 2018-01-22 – 2018-01-23 (×2): via INTRAVENOUS

## 2018-01-22 NOTE — Telephone Encounter (Signed)
Ok to hold ASA but time frame for holding is up to Psychologist, sport and exercise

## 2018-01-22 NOTE — Progress Notes (Signed)
PROGRESS NOTE  [name] KWI:[id number] DOB: Oct 22, 1928 DOA: 01/21/2018 PCP: Hulan Fess, MD  HPI/Recap of past 107 hours: 84 year old female with medical history significant for CAD status post CABG, hypertension, GERD, polymyalgia rheumatica, recent right ureteral stent placement in 11/2017, presented with persistent right lower quadrant abdominal pain since June of this year.  Patient reported some diarrhea with dark stools, usually after every time she eats food.  Noted some nausea and vomiting as well denies any dysuria fever/chills, chest pain, shortness of breath.  Patient admitted for further management.  Today, patient still reported some mild right lower quadrant pain, denies any current nausea/vomiting, fever/chills, diarrhea, chest pain, shortness of breath.  Assessment/Plan: Active Problems:   Acute kidney injury ([place])   Urinary tract infection without hematuria  Persistent right sided abdominal pain Ongoing since 11/2017, occasional nausea/vomiting/diarrhea Afebrile, with leukocytosis (on chronic steroid) LFTs within normal limits Had C. difficile tested on the outside, negative FOBT pending BC X 2 NGTD CT abdomen pelvis showed right ureteral stent in satisfactory position, with some fullness of the right renal collecting system and proximal right ureter likely related to reflux from the bladder.  No gallstones, gallbladder Recupero thickening or biliary dilatation Has been seen by Dr. Earlean Shawl [facility])- RUQ USS showed biliary sludge without evidence of cholecystitis.  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 with proximal hydronephrosis Status post stent placement in 11/2017 Follows with urology as outpatient (urology reported abdominal pain unlikely due to papillary tumor)  Anemia of CKD Hemoglobin baseline around 11-12--->10.2 on admission FOBT pending Iron panel shows iron 21, sats 7%, ferritin 16, folate 21.8, vitamin B12 560 Daily CBC  Hypertension Stable Continue amlodipine Coreg, clonidine Held ARB, Lasix  Chronic diastolic HF Appears euvolemic ECHO in 2014: Showed EF of 60 to 24%, grade 2 diastolic dysfunction Continue to hold Lasix  CAD status post CABG Chest pain-free Continue aspirin, Coreg  Hypothyroidism Continue Synthroid  DM type II A1c 5.9 Diet controlled SSI, Accu-Cheks  GERD PPI  Polymyalgia rheumatica Continue steroids       Code Status: Full  Family Communication: Son at bedside  Disposition Plan: Once work-up complete   Consultants:  GI  Procedures:  None  Antimicrobials:  IV ceftriaxone  DVT prophylaxis: SCDs   Objective: Vitals:   01/21/18 1108 01/21/18 1528 01/21/18 2014 01/22/18 0519  BP: (!) 159/59  (!) 127/57 (!) 149/52  Pulse: 73  61 73  Resp: 16  18 18   Temp: 98 F (36.7 C)  98.1 F (36.7 C) 97.6 F (36.4 C)  TempSrc: Oral  Oral Oral  SpO2: 100% 94% 97% 97%  Weight:    82.6 kg (182 lb 1.6 oz)  Height:        Intake/Output Summary (Last 24 hours) at 01/22/2018 1342 Last data filed at 01/22/2018 0748 Gross per 24 hour  Intake 1747.72 ml  Output -  Net 1747.72 ml   Filed Weights   01/20/18 2307 01/21/18 1000 01/22/18 0519  Weight: 79.8 kg (176 lb) 77.8 kg (171 lb 8.3 oz) 82.6 kg (182 lb 1.6 oz)  Exam:   General: NAD  Cardiovascular: S1, S2 present  Respiratory: CTA B  Abdomen: Soft, mild TTP on RLQ, ND, BS +  Musculoskeletal: No pedal edema bilaterally  Skin: Normal  Psychiatry: Normal mood   Data Reviewed: CBC: Recent Labs  Lab  01/21/18 0630 01/22/18 0543  WBC 16.3* 15.2*  NEUTROABS 6.5  --   HGB 10.2* 10.3*  HCT 32.5* 33.7*  MCV 78.1 77.5*  PLT 384 045*   Basic Metabolic Panel: Recent Labs  Lab 01/21/18 0630 01/22/18 0543  NA 136 136  K 3.9 3.9  CL 103 101  CO2 22 24  GLUCOSE 101* 100*  BUN 37* 31*  CREATININE 2.10* 1.73*  CALCIUM 8.2* 8.7*  MG  --  2.1   GFR: Estimated Creatinine Clearance: 23.8 mL/min (A) (by C-G formula based on SCr of 1.73 mg/dL (H)). Liver Function Tests: Recent Labs  Lab 01/21/18 0630 01/22/18 0543  AST 15 18  ALT 13 14  ALKPHOS 78 86  BILITOT 0.9 0.9  PROT 5.9* 6.4*  ALBUMIN 2.7* 2.8*   Recent Labs  Lab 01/21/18 0630  LIPASE 30   No results for input(s): AMMONIA in the last 168 hours. Coagulation Profile: No results for input(s): INR, PROTIME in the last 168 hours. Cardiac Enzymes: No results for input(s): CKTOTAL, CKMB, CKMBINDEX, TROPONINI in the last 168 hours. BNP (last 3 results) No results for input(s): PROBNP in the last 8760 hours. HbA1C: Recent Labs    01/21/18 0630  HGBA1C 5.9*   CBG: Recent Labs  Lab 01/21/18 1154  GLUCAP 106*   Lipid Profile: No results for input(s): CHOL, HDL, LDLCALC, TRIG, CHOLHDL, LDLDIRECT in the last 72 hours. Thyroid Function Tests: No results for input(s): TSH, T4TOTAL, FREET4, T3FREE, THYROIDAB in the last 72 hours. Anemia Panel: Recent Labs    01/22/18 0543  VITAMINB12 560  FOLATE 21.8  FERRITIN 16  TIBC 314  IRON 21*   Urine analysis:    Component Value Date/Time   COLORURINE YELLOW 01/21/2018 0416   APPEARANCEUR HAZY (A) 01/21/2018 0416   LABSPEC 1.010 01/21/2018 0416   PHURINE 6.0 01/21/2018 0416   GLUCOSEU 50 (A) 01/21/2018 0416   HGBUR LARGE (A) 01/21/2018 0416   BILIRUBINUR NEGATIVE 01/21/2018 0416   KETONESUR NEGATIVE 01/21/2018 0416   PROTEINUR 100 (A) 01/21/2018 0416   UROBILINOGEN 0.2 07/08/2010 1238   NITRITE NEGATIVE 01/21/2018 0416   LEUKOCYTESUR LARGE (A) 01/21/2018 0416    Sepsis Labs: @LABRCNTIP (procalcitonin:4,lacticidven:4)  ) Recent Results (from the past 240 hour(s))  Culture, Urine     Status: Abnormal   Collection Time: 01/21/18  4:16 AM  Result Value Ref Range Status   Specimen Description   Final    URINE, CLEAN CATCH Performed at [facility], [place] [address] [place], [place] [address]    Special Requests   Final    NONE Performed at [facility], [place] [address] [place], [place] [address]    Culture (A)  Final    60,000 COLONIES/mL GROUP B STREP(S.AGALACTIAE)ISOLATED TESTING AGAINST S. AGALACTIAE NOT ROUTINELY PERFORMED DUE TO PREDICTABILITY OF AMP/PEN/VAN SUSCEPTIBILITY. Performed at [facility], [place] [address], [place], [place] [address]    Report Status 01/22/2018 FINAL  Final      Studies: No results found.  Scheduled Meds: . amLODipine  10 mg Oral Daily  . aspirin EC  81 mg Oral Q1500  . carvedilol  1.5625 mg Oral BID WC  . cloNIDine  0.1 mg Oral  BID  . insulin aspart  0-5 Units Subcutaneous QHS  . insulin aspart  0-9 Units Subcutaneous TID WC  . levothyroxine  50 mcg Oral Q0600  . pantoprazole  40 mg Oral QAC breakfast  . predniSONE  8 mg Oral Daily  . [START ON 01/23/2018] pyridOXINE  100 mg Oral Weekly  . [START ON 01/23/2018] vitamin B-12  500 mcg Oral Weekly    Continuous Infusions: . sodium chloride 75 mL/hr at 01/22/18 1048  . cefTRIAXone (ROCEPHIN)  IV 1 g (01/22/18 0846)     LOS: 0 days     Alma Friendly, MD [facility]   If 7PM-7AM, please contact night-coverage [email or web address] Password [email or web address] 01/22/2018, 1:42 PM

## 2018-01-22 NOTE — Consult Note (Signed)
Referring Provider: Dr. Horris Latino Primary Care Physician:  Hulan Fess, MD Primary Gastroenterologist:  Althia Forts (Dr. Earlean Shawl)  Reason for Consultation:  Abdominal pain; Anemia  HPI: [name] is a 84 y.o. female with multiple medical problems seen for a consult due to chronic abdominal pain that has been persistent for the past month. Pain is mainly right-sided (RUQ and RLQ) but also will have pain across her abdomen at times. Pain is crampy at times and is worse after eating. Occasional N/V with eating. Reports black stools this past Sunday that occurred all day but has not seen any black stools since then. Denies iron pills or Pepto-Bismol. Poor appetite. Reports over 10 pound weight loss in the past month. Intermittent diarrhea for the past month. Hgb 10.3 with low normal ferritin and low iron and iron saturation. Right ureteral stent placed in June. Denies hematochezia or hematemesis. Reports colonoscopy by Dr. Earlean Shawl in 2014 but results not known. U/S last week reportedly showed biliary sludge without cholecystitis. Patient requesting transfer of her GI care to [facility] and used to see Dr. Oletta Lamas years ago who she says diagnosed her with celiac sprue. She has been on a gluten-free diet for years. Has GERD that is well-controlled with Pantoprazole. On chronic steroids for PMR. Son at bedside.   Past Medical History:  Diagnosis Date  . Carotid artery occlusion    1-39% bilateral carotid stenosis by dopplers 05/2016  . Celiac sprue   . Coronary artery disease    s/p CABG with 50% stenosis at ostium of the SVG to dirst diagonal, patent LIMA to LAD and patent left circ with nonobstructive ASCAD of the RCA  . Diabetes mellitus    diet controlled  . Diverticulitis   . Gait disorder   . GERD (gastroesophageal reflux disease)   . Hyperchloremia   . Hyperlipidemia    statin intolerant  . Hypertension   . Meningioma ([place])   . Obesity   . Rosacea     Past Surgical History:  Procedure  Laterality Date  . ABDOMINAL HYSTERECTOMY    . BRAIN TUMOR EXCISION  1993   meningioma, left frontal  . BUNIONECTOMY    . CARDIAC CATHETERIZATION    . CORONARY ARTERY BYPASS GRAFT  2002    3 vessel  . CYSTOSCOPY WITH RETROGRADE PYELOGRAM, URETEROSCOPY AND STENT PLACEMENT Right 12/14/2017   Procedure: CYSTOSCOPY WITH RIGHT RETROGRADE PYELOGRAM, RIGHT URETEROSCOPY AND RIGHT URETERAL STENT PLACEMENT, WASHINGS;  Surgeon: Franchot Gallo, MD;  Location: [facility];  Service: Urology;  Laterality: Right;  . JOINT REPLACEMENT  2009  . REPLACEMENT TOTAL KNEE    . ROTATOR CUFF REPAIR  2010   Dr. Gladstone Lighter  . TONSILLECTOMY      Prior to Admission medications   Medication Sig Start Date End Date Taking? Authorizing Provider  amLODipine (NORVASC) 10 MG tablet Take 1 tablet (10 mg total) by mouth daily. 08/20/17 08/15/18 Yes Turner, Eber Hong, MD  Ascorbic Acid (VITAMIN C PO) Take 1 tablet by mouth daily at 3 pm.   Yes [provider]  aspirin EC 81 MG tablet Take 81 mg by mouth daily at 3 pm.   Yes [provider]  azelastine (ASTELIN) 0.1 % nasal spray Place 1 spray into both nostrils 2 (two) times daily. Use in each nostril as directed   Yes [provider]  Biotin 5000 MCG TABS Take 5,000 mcg by mouth once a week. On Tuesday   Yes [provider]  Calcium Citrate (CITRACAL PO) Take  1 tablet by mouth daily at 3 pm.    Yes [provider]  carvedilol (COREG) 3.125 MG tablet Take 1.5625 mg by mouth 2 (two) times daily.  11/06/17  Yes [provider]  Cholecalciferol 4000 UNITS CAPS Take 4,000 Units by mouth daily at 3 pm.   Yes [provider]  cloNIDine (CATAPRES) 0.1 MG tablet Take 0.1 mg by mouth 2 (two) times daily.   Yes [provider]  furosemide (LASIX) 40 MG tablet Take 1 tablet (40 mg total) by mouth daily. Morning and mid evening Patient taking differently: Take 40 mg by mouth 2 (two) times daily. Morning and mid evening  01/07/17  Yes Turner, Traci R, MD  irbesartan (AVAPRO) 300 MG tablet Take 300 mg by mouth daily.   Yes [provider]  Levothyroxine Sodium 50 MCG CAPS Take 1 capsule by mouth daily.    Yes [provider]  METRONIDAZOLE, TOPICAL, 0.75 % LOTN Apply 1 application topically at bedtime as needed (rosacea).   Yes [provider]  Multiple Vitamin (MULITIVITAMIN WITH MINERALS) TABS Take 1 tablet by mouth daily at 3 pm.    Yes [provider]  ondansetron (ZOFRAN) 4 MG tablet Take 4 mg by mouth every 8 (eight) hours as needed for nausea. for nausea 12/22/17  Yes [provider]  pantoprazole (PROTONIX) 40 MG tablet Take 40 mg by mouth daily before breakfast.   Yes [provider]  potassium chloride (KLOR-CON) 8 MEQ tablet Take 8 mEq by mouth daily.  04/26/14  Yes [provider]  predniSONE (DELTASONE) 1 MG tablet Take 3 mg by mouth daily. Take with 5 mg tablet   Yes [provider]  predniSONE (DELTASONE) 5 MG tablet Take 5 mg by mouth daily. Take with 1 mg tablets   Yes [provider]  pyridOXINE (VITAMIN B-6) 100 MG tablet Take 100 mg by mouth once a week. On Thursday   Yes [provider]  sulfamethoxazole-trimethoprim (BACTRIM DS,SEPTRA DS) 800-160 MG tablet Take 1 tablet by mouth 2 (two) times daily. 01/20/18  Yes [provider]  traMADol (ULTRAM) 50 MG tablet Take 50 mg by mouth every 4 (four) hours as needed for moderate pain.  12/24/17  Yes [provider]  vitamin B-12 (CYANOCOBALAMIN) 500 MCG tablet Take 500 mcg by mouth once a week. On Thursdays   Yes [provider]    Scheduled Meds: . amLODipine  10 mg Oral Daily  . aspirin EC  81 mg Oral Q1500  . carvedilol  1.5625 mg Oral BID WC  . cloNIDine  0.1 mg Oral BID  . insulin aspart  0-5 Units Subcutaneous QHS  . insulin aspart  0-9 Units Subcutaneous TID WC  . levothyroxine  50 mcg Oral Q0600  . pantoprazole  40 mg Oral QAC  breakfast  . [START ON 01/23/2018] polyethylene glycol-electrolytes  4,000 mL Oral Once  . predniSONE  8 mg Oral Daily  . [START ON 01/23/2018] pyridOXINE  100 mg Oral Weekly  . [START ON 01/23/2018] vitamin B-12  500 mcg Oral Weekly   Continuous Infusions: . sodium chloride 75 mL/hr at 01/22/18 1048  . sodium chloride    . cefTRIAXone (ROCEPHIN)  IV 1 g (01/22/18 0846)   PRN Meds:.metroNIDAZOLE, ondansetron, traMADol  Allergies as of 01/20/2018 - Review Complete 01/20/2018  Allergen Reaction Noted  . Accutane [isotretinoin] Other (See Comments) 01/23/2012  . Ciprofloxacin Rash and Other (See Comments) 08/04/2011  . Erythromycin Diarrhea and Rash 01/23/2012  .  Gluten Diarrhea and Other (See Comments) 08/04/2011  . Gluten meal Diarrhea and Rash 08/04/2011  . Isotretinoin Other (See Comments) 08/04/2011  . Lipitor [atorvastatin] Other (See Comments) 01/23/2012  . Lisinopril Other (See Comments) 01/23/2012  . Lovastatin Other (See Comments) 01/23/2012  . Penicillins Other (See Comments) 08/04/2011  . Phenytoin sodium extended Other (See Comments) 08/04/2011  . Pravastatin Other (See Comments) 01/23/2012  . Crestor [rosuvastatin]  05/29/2013  . Gabapentin Other (See Comments) 06/20/2013  . Metoprolol Itching and Swelling 01/25/2017  . Prolia [denosumab] Other (See Comments) 05/07/2013  . Boniva [ibandronic acid] Other (See Comments) 01/23/2012  . Clarithromycin Rash 08/05/2011  . Hydralazine Rash 05/14/2017  . Lescol [fluvastatin sodium] Other (See Comments) 01/23/2012  . Sulfa antibiotics Rash 08/04/2011  . Sulfasalazine Rash 08/04/2011  . Welchol [colesevelam hcl] Other (See Comments) 01/23/2012  . Zetia [ezetimibe] Other (See Comments) 01/23/2012  . Zithromax [azithromycin] Rash 01/23/2012    Family History  Problem Relation Age of Onset  . Diabetes Mother   . Heart disease Brother   . Diabetes Sister   . Diabetes Sister     Social History   Socioeconomic History  .  Marital status: Widowed    Spouse name: Not on file  . Number of children: 4  . Years of education: 57  . Highest education level: Not on file  Occupational History  . Occupation: Retired    Fish farm manager: RETIRED  Social Needs  . Financial resource strain: Not on file  . Food insecurity:    Worry: Not on file    Inability: Not on file  . Transportation needs:    Medical: Not on file    Non-medical: Not on file  Tobacco Use  . Smoking status: Never Smoker  . Smokeless tobacco: Never Used  Substance and Sexual Activity  . Alcohol use: No  . Drug use: No  . Sexual activity: Not on file  Lifestyle  . Physical activity:    Days per week: Not on file    Minutes per session: Not on file  . Stress: Not on file  Relationships  . Social connections:    Talks on phone: Not on file    Gets together: Not on file    Attends religious service: Not on file    Active member of club or organization: Not on file    Attends meetings of clubs or organizations: Not on file    Relationship status: Not on file  . Intimate partner violence:    Fear of current or ex partner: Not on file    Emotionally abused: Not on file    Physically abused: Not on file    Forced sexual activity: Not on file  Other Topics Concern  . Not on file  Social History Narrative  . Not on file    Review of Systems: All negative except as stated above in HPI.  Physical Exam: Vital signs: Vitals:   01/22/18 0519 01/22/18 1410  BP: (!) 149/52 (!) 169/57  Pulse: 73 76  Resp: 18   Temp: 97.6 F (36.4 C) 98.7 F (37.1 C)  SpO2: 97% 97%   Last BM Date: 01/20/18 General:   Lethargic, elderly, frail, well-nourished, no acute distress, pleasant Head: normocephalic, atraumatic Eyes: anicteric sclera ENT: oropharynx clear Neck: supple, nontender Lungs:  Clear throughout to auscultation.   No wheezes, crackles, or rhonchi. No acute distress. Heart:  Regular rate and rhythm; no murmurs, clicks, rubs,  or  gallops. Abdomen: RLQ and right mid-quadrant  tenderness with guarding, soft, nondistended, +BS  Rectal:  Deferred Ext: no edema  GI:  Lab Results: Recent Labs    01/21/18 0630 01/22/18 0543  WBC 16.3* 15.2*  HGB 10.2* 10.3*  HCT 32.5* 33.7*  PLT 384 411*   BMET Recent Labs    01/21/18 0630 01/22/18 0543  NA 136 136  K 3.9 3.9  CL 103 101  CO2 22 24  GLUCOSE 101* 100*  BUN 37* 31*  CREATININE 2.10* 1.73*  CALCIUM 8.2* 8.7*   LFT Recent Labs    01/22/18 0543  PROT 6.4*  ALBUMIN 2.8*  AST 18  ALT 14  ALKPHOS 86  BILITOT 0.9   PT/INR No results for input(s): LABPROT, INR in the last 72 hours.   Studies/Results: Ct Abdomen Pelvis Wo Contrast  Result Date: 01/21/2018 CLINICAL DATA:  Abdominal pain with nausea and vomiting, history of recent stent placement EXAM: CT ABDOMEN AND PELVIS WITHOUT CONTRAST TECHNIQUE: Multidetector CT imaging of the abdomen and pelvis was performed following the standard protocol without IV contrast. COMPARISON:  12/25/2017 FINDINGS: Lower chest: No acute abnormality. Hepatobiliary: No focal liver abnormality is seen. No gallstones, gallbladder Kiedrowski thickening, or biliary dilatation. Pancreas: Unremarkable. No pancreatic ductal dilatation or surrounding inflammatory changes. Spleen: Normal in size without focal abnormality. Adrenals/Urinary Tract: Kidneys are well visualized bilaterally. Diffuse cortical thinning is noted on the left similar to that seen on the prior exam. Scattered renal cystic changes are noted. No definitive renal calculi are seen. Fullness of the right renal collecting system and proximal right ureter are again identified. A right ureteral stent is noted in satisfactory position. The bladder is well distended. Stomach/Bowel: Diverticular change of the colon is noted without evidence of diverticulitis. The appendix is within normal limits. No obstructive or inflammatory changes are seen. Vascular/Lymphatic: Aortic  atherosclerosis. No enlarged abdominal or pelvic lymph nodes. A few small retroperitoneal lymph nodes are identified not significant by size criteria. These are stable from the prior exam. Reproductive: Status post hysterectomy. Other: No abdominal Bonneau hernia or abnormality. No abdominopelvic ascites. Musculoskeletal: Degenerative changes of the lumbar spine are seen. No acute bony abnormality is noted. IMPRESSION: Right ureteral stent in satisfactory position. There remains some fullness of the right renal collecting system and proximal right ureter likely related to reflux from the bladder. The known ureteral lesion is not well appreciated. Chronic changes as described above. Electronically Signed   By: Inez Catalina M.D.   On: 01/21/2018 07:51   Dg Chest 2 View  Result Date: 01/21/2018 CLINICAL DATA:  Pain and nausea.  Hypertension. EXAM: CHEST - 2 VIEW COMPARISON:  January 25, 2015 FINDINGS: There is no edema or consolidation. Heart is upper normal in size with pulmonary vascularity normal. No adenopathy. Patient is status post coronary artery bypass grafting. There is aortic atherosclerosis. There is thoracic dextroscoliosis. There is degenerative change throughout the thoracic spine. IMPRESSION: No edema or consolidation. Heart size within normal limits. Status post coronary artery bypass grafting. There is aortic atherosclerosis. Aortic Atherosclerosis (ICD10-I70.0). Electronically Signed   By: Lowella Grip III M.D.   On: 01/21/2018 09:14    Impression/Plan: Chronic right-sided abdominal pain in the setting of celiac disease. Recent ureteral stent that is in position per CT. Abdominal pain not specific for any particular GI process but needs an updated colonoscopy to look for colonic malignancy. Needs EGD as well to further evaluate her symptoms. Long-standing celiac disease could be playing a role due to increased risk of small bowel lymphoma  in celiac disease and may need a capsule endoscopy if  EGD/colonoscopy is unrevealing for a source but location of pain not typical for small bowel lymphoma. Full liquid for dinner today. Clear liquid diet tomorrow. Colon prep tomorrow afternoon and do EGD/colonoscopy on 01/24/18.     LOS: 0 days   [place] C.  01/22/2018, 3:56 PM  Questions please call [phone]

## 2018-01-22 NOTE — Telephone Encounter (Signed)
Routing to Dr. Radford Pax to give in put about holding aspirin.   A clearance for a colonoscopy was given on 01/17/2018 and the patient was felt to be doing well since her last visit and did not require additional testing from our standpoint.   Now needing biopsy of uretal tumor.   Dr. Radford Pax,  Please advise on how long to hold aspirin and route your response back to CV DIV PREOP pool.Burtis Junes, RN, [place] [address] [place], [place]  [address] [phone]

## 2018-01-23 DIAGNOSIS — I10 Essential (primary) hypertension: Secondary | ICD-10-CM

## 2018-01-23 LAB — BASIC METABOLIC PANEL
Anion gap: 7 (ref 5–15)
BUN: 18 mg/dL (ref 8–23)
CALCIUM: 8.5 mg/dL — AB (ref 8.9–10.3)
CO2: 24 mmol/L (ref 22–32)
CREATININE: 1.2 mg/dL — AB (ref 0.44–1.00)
Chloride: 107 mmol/L (ref 98–111)
GFR calc Af Amer: 46 mL/min — ABNORMAL LOW (ref >60)
GFR, EST NON AFRICAN AMERICAN: 39 mL/min — AB (ref >60)
GLUCOSE: 95 mg/dL (ref 70–99)
Potassium: 3.9 mmol/L (ref 3.5–5.1)
Sodium: 138 mmol/L (ref 135–145)

## 2018-01-23 LAB — GLUCOSE, CAPILLARY
GLUCOSE-CAPILLARY: 104 mg/dL — AB (ref 70–99)
GLUCOSE-CAPILLARY: 110 mg/dL — AB (ref 70–99)
GLUCOSE-CAPILLARY: 143 mg/dL — AB (ref 70–99)
Glucose-Capillary: 114 mg/dL — ABNORMAL HIGH (ref 70–99)

## 2018-01-23 LAB — CBC WITH DIFFERENTIAL/PLATELET
BASOS PCT: 1 %
Basophils Absolute: 0.1 10*3/uL (ref 0.0–0.1)
EOS ABS: 0.2 10*3/uL (ref 0.0–0.7)
EOS PCT: 2 %
HCT: 30.1 % — ABNORMAL LOW (ref 36.0–46.0)
Hemoglobin: 9.4 g/dL — ABNORMAL LOW (ref 12.0–15.0)
Lymphocytes Relative: 52 %
Lymphs Abs: 6 10*3/uL — ABNORMAL HIGH (ref 0.7–4.0)
MCH: 24.1 pg — AB (ref 26.0–34.0)
MCHC: 31.2 g/dL (ref 30.0–36.0)
MCV: 77.2 fL — AB (ref 78.0–100.0)
MONO ABS: 0.5 10*3/uL (ref 0.1–1.0)
Monocytes Relative: 4 %
NEUTROS PCT: 41 %
Neutro Abs: 4.8 10*3/uL (ref 1.7–7.7)
PLATELETS: 337 10*3/uL (ref 150–400)
RBC: 3.9 MIL/uL (ref 3.87–5.11)
RDW: 18.8 % — AB (ref 11.5–15.5)
WBC: 11.6 10*3/uL — AB (ref 4.0–10.5)

## 2018-01-23 LAB — TISSUE TRANSGLUTAMINASE, IGA

## 2018-01-23 LAB — IGG, IGA, IGM
IGG (IMMUNOGLOBIN G), SERUM: 1267 mg/dL (ref 700–1600)
IgA: <5 mg/dL — ABNORMAL LOW (ref 64–422)
IgM (Immunoglobulin M), Srm: 54 mg/dL (ref 26–217)

## 2018-01-23 MED ORDER — CEPHALEXIN 500 MG PO CAPS
500.0000 mg | ORAL_CAPSULE | Freq: Two times a day (BID) | ORAL | Status: DC
  Administered 2018-01-23 – 2018-01-25 (×5): 500 mg via ORAL
  Filled 2018-01-23 (×5): qty 1

## 2018-01-23 MED ORDER — FERROUS SULFATE 325 (65 FE) MG PO TABS
325.0000 mg | ORAL_TABLET | Freq: Three times a day (TID) | ORAL | Status: DC
  Administered 2018-01-23 – 2018-01-25 (×3): 325 mg via ORAL
  Filled 2018-01-23 (×4): qty 1

## 2018-01-23 NOTE — Progress Notes (Signed)
PROGRESS NOTE  [name]:[id number] DOB: [birth year] DOA: 01/21/2018 PCP: Hulan Fess, MD  HPI/Recap of past 31 hours: 84 year old female with medical history significant for CAD status post CABG, hypertension, GERD, polymyalgia rheumatica, recent right ureteral stent placement in 11/2017, presented with persistent right lower quadrant abdominal pain since June of this year.  Patient reported some diarrhea with dark stools, usually after every time she eats food.  Noted some nausea and vomiting as well denies any dysuria fever/chills, chest pain, shortness of breath.  Patient admitted for further management.  Today, patient still reported some mild right lower quadrant pain. Denies any new complaints  Assessment/Plan: Active Problems:   Acute kidney injury ([place])   Urinary tract infection without hematuria   AKI (acute kidney injury) ([place])  Chronic right sided abdominal pain/Hx of celiac dsz Ongoing since 11/2017, occasional nausea/vomiting/diarrhea Afebrile, with leukocytosis (on chronic steroid) LFTs within normal limits Had C. difficile tested on the outside, negative FOBT pending BC X 2 NGTD CT abdomen pelvis showed right ureteral stent in satisfactory position, with some fullness of the right renal collecting system and proximal right ureter likely related to reflux from the bladder.  No gallstones, gallbladder Mayeda thickening or biliary dilatation Has been seen by Dr. Earlean Shawl [facility])- RUQ USS showed biliary sludge without evidence of cholecystitis.  Planned to have EGD/colonoscopy Patient and family requesting to be seen by [facility] GI physicians so as to transfer/establish care. 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 with proximal hydronephrosis Status post stent placement in 11/2017 Follows with urology as outpatient (urology reported abdominal pain unlikely due to papillary tumor) Plan to biopsy as an outpatient  Anemia of CKD Hemoglobin baseline around 11-12--->10.2 on admission Iron panel shows iron 21, sats 7%, ferritin 16, folate 21.8, vitamin B12 560 Iron supplementation Daily CBC  Hypertension Stable Continue amlodipine Coreg, clonidine Held ARB, Lasix  Chronic diastolic HF Appears euvolemic ECHO in 2014: Showed EF of 60 to 56%, grade 2 diastolic dysfunction Continue to hold Lasix  CAD status post CABG Chest pain-free Continue aspirin, Coreg  Hypothyroidism Continue Synthroid  DM type II A1c 5.9 Diet controlled SSI, Accu-Cheks  GERD PPI  Polymyalgia rheumatica Continue steroids       Code Status: Full  Family Communication: None at bedside  Disposition Plan: Once work-up complete   Consultants:  GI  Procedures:  None  Antimicrobials:  PO Keflex  DVT prophylaxis: SCDs   Objective: Vitals:   01/22/18 1410 01/22/18 2045 01/23/18 0519 01/23/18 1428  BP: (!) 169/57 (!) 156/58 (!) 162/58 (!) 160/51  Pulse: 76 69 67 81  Resp:  20 18   Temp: 98.7 F (37.1 C) 98.2 F (36.8 C) 98.2 F (36.8 C) 98.7 F (37.1 C)  TempSrc: Oral Oral  Oral  SpO2: 97% 99% 99% 90%  Weight:   81.9 kg   Height:        Intake/Output Summary (Last 24 hours) at 01/23/2018 1749 Last data filed at 01/23/2018 3875 Gross per 24 hour  Intake 669.17 ml  Output -  Net 669.17 ml  Filed Weights   01/21/18 1000 01/22/18 0519 01/23/18 0519  Weight: 77.8 kg 82.6 kg 81.9 kg    Exam:   General: NAD  Cardiovascular: S1, S2 present  Respiratory: CTA B  Abdomen: Soft, mild TTP on RLQ, ND, BS +  Musculoskeletal: No pedal edema  bilaterally  Skin: Normal  Psychiatry: Normal mood   Data Reviewed: CBC: Recent Labs  Lab 01/21/18 0630 01/22/18 0543 01/23/18 0544  WBC 16.3* 15.2* 11.6*  NEUTROABS 6.5  --  4.8  HGB 10.2* 10.3* 9.4*  HCT 32.5* 33.7* 30.1*  MCV 78.1 77.5* 77.2*  PLT 384 411* 161   Basic Metabolic Panel: Recent Labs  Lab 01/21/18 0630 01/22/18 0543 01/23/18 0544  NA 136 136 138  K 3.9 3.9 3.9  CL 103 101 107  CO2 22 24 24   GLUCOSE 101* 100* 95  BUN 37* 31* 18  CREATININE 2.10* 1.73* 1.20*  CALCIUM 8.2* 8.7* 8.5*  MG  --  2.1  --    GFR: Estimated Creatinine Clearance: 34.2 mL/min (A) (by C-G formula based on SCr of 1.2 mg/dL (H)). Liver Function Tests: Recent Labs  Lab 01/21/18 0630 01/22/18 0543  AST 15 18  ALT 13 14  ALKPHOS 78 86  BILITOT 0.9 0.9  PROT 5.9* 6.4*  ALBUMIN 2.7* 2.8*   Recent Labs  Lab 01/21/18 0630  LIPASE 30   No results for input(s): AMMONIA in the last 168 hours. Coagulation Profile: No results for input(s): INR, PROTIME in the last 168 hours. Cardiac Enzymes: No results for input(s): CKTOTAL, CKMB, CKMBINDEX, TROPONINI in the last 168 hours. BNP (last 3 results) No results for input(s): PROBNP in the last 8760 hours. HbA1C: Recent Labs    01/21/18 0630  HGBA1C 5.9*   CBG: Recent Labs  Lab 01/22/18 1109 01/22/18 1637 01/22/18 2048 01/23/18 0749 01/23/18 1120  GLUCAP 108* 126* 175* 104* 110*   Lipid Profile: No results for input(s): CHOL, HDL, LDLCALC, TRIG, CHOLHDL, LDLDIRECT in the last 72 hours. Thyroid Function Tests: No results for input(s): TSH, T4TOTAL, FREET4, T3FREE, THYROIDAB in the last 72 hours. Anemia Panel: Recent Labs    01/22/18 0543  VITAMINB12 560  FOLATE 21.8  FERRITIN 16  TIBC 314  IRON 21*   Urine analysis:    Component Value Date/Time   COLORURINE YELLOW 01/21/2018 0416   APPEARANCEUR HAZY (A) 01/21/2018 0416   LABSPEC 1.010 01/21/2018 0416   PHURINE 6.0 01/21/2018 0416   GLUCOSEU 50 (A)  01/21/2018 0416   HGBUR LARGE (A) 01/21/2018 0416   BILIRUBINUR NEGATIVE 01/21/2018 0416   KETONESUR NEGATIVE 01/21/2018 0416   PROTEINUR 100 (A) 01/21/2018 0416   UROBILINOGEN 0.2 07/08/2010 1238   NITRITE NEGATIVE 01/21/2018 0416   LEUKOCYTESUR LARGE (A) 01/21/2018 0416   Sepsis Labs: @LABRCNTIP (procalcitonin:4,lacticidven:4)  ) Recent Results (from the past 240 hour(s))  Culture, Urine     Status: Abnormal   Collection Time: 01/21/18  4:16 AM  Result Value Ref Range Status   Specimen Description   Final    URINE, CLEAN CATCH Performed at [facility], [place] [address] [place] [address], [place], [place] [address]    Special Requests   Final    NONE Performed at [facility], [place] [address] [place], [place] [address]    Culture (A)  Final    60,000 COLONIES/mL GROUP B STREP(S.AGALACTIAE)ISOLATED TESTING AGAINST S. AGALACTIAE NOT ROUTINELY PERFORMED DUE TO PREDICTABILITY OF AMP/PEN/VAN SUSCEPTIBILITY. Performed at [facility], [place] [address], [place], [place]  [address]    Report Status 01/22/2018 FINAL  Final  Culture, blood (routine x 2)     Status: None (Preliminary result)   Collection Time: 01/21/18 10:03 AM  Result Value Ref Range Status   Specimen Description   Final    BLOOD LEFT HAND Performed at [place] [address] [place], [place] [address]    Special Requests   Final    BOTTLES DRAWN AEROBIC AND ANAEROBIC Blood Culture results may not be optimal due to an excessive volume of blood received in culture bottles Performed at [place] [address] [place], [place] [address]    Culture   Final    NO GROWTH 2 DAYS Performed at [place] [address] [place], [place] [address]    Report Status PENDING  Incomplete  Culture, blood (routine x 2)     Status: None (Preliminary result)   Collection Time: 01/21/18 10:04 AM  Result Value Ref Range Status   Specimen Description    Final    BLOOD RIGHT HAND Performed at [place] [address] [place], [place] [address]    Special Requests   Final    BOTTLES DRAWN AEROBIC AND ANAEROBIC Blood Culture adequate volume Performed at [place] [address], [place], [place] [address]    Culture   Final    NO GROWTH 2 DAYS Performed at [place] [address] [place],  [address]    Report Status PENDING  Incomplete      Studies: No results found.  Scheduled Meds: . amLODipine  10 mg Oral Daily  . aspirin EC  81 mg Oral Q1500  . carvedilol  1.5625 mg Oral BID WC  . cephALEXin  500 mg Oral Q12H  . cloNIDine  0.1 mg Oral BID  . ferrous sulfate  325 mg Oral TID WC  . insulin aspart  0-5 Units Subcutaneous QHS  . insulin aspart  0-9 Units Subcutaneous TID WC  . levothyroxine  50 mcg Oral Q0600  . pantoprazole  40 mg Oral QAC breakfast  . polyethylene glycol-electrolytes  4,000 mL Oral Once  . predniSONE  8 mg Oral Daily  . pyridOXINE  100 mg Oral Weekly  . vitamin B-12  500 mcg Oral Weekly    Continuous Infusions: . sodium chloride 75 mL/hr at 01/23/18 1646  . sodium chloride       LOS: 1 day     Alma Friendly, MD [facility]   If 7PM-7AM, please contact night-coverage [email or web address] Password [facility] 01/23/2018, 5:49 PM

## 2018-01-23 NOTE — Progress Notes (Signed)
[facility] Gastroenterology Progress Note  [name] 84 y.o. [birth year]   Subjective: Felt nauseous this morning without vomiting. Complains of ongoing right-sided abdominal pain. Grandson in room.  Objective: Vital signs: Vitals:   01/22/18 2045 01/23/18 0519  BP: (!) 156/58 (!) 162/58  Pulse: 69 67  Resp: 20 18  Temp: 98.2 F (36.8 C) 98.2 F (36.8 C)  SpO2: 99% 99%    Physical Exam: Gen: lethargic, elderly, frail, well-nourished, no acute distress  HEENT: anicteric sclera CV: RRR Chest: CTA B Abd: right-sided tenderness with guarding, soft, mild distention, +BS Ext: no edema  Lab Results: Recent Labs    01/22/18 0543 01/23/18 0544  NA 136 138  K 3.9 3.9  CL 101 107  CO2 24 24  GLUCOSE 100* 95  BUN 31* 18  CREATININE 1.73* 1.20*  CALCIUM 8.7* 8.5*  MG 2.1  --    Recent Labs    01/21/18 0630 01/22/18 0543  AST 15 18  ALT 13 14  ALKPHOS 78 86  BILITOT 0.9 0.9  PROT 5.9* 6.4*  ALBUMIN 2.7* 2.8*   Recent Labs    01/21/18 0630 01/22/18 0543 01/23/18 0544  WBC 16.3* 15.2* 11.6*  NEUTROABS 6.5  --  4.8  HGB 10.2* 10.3* 9.4*  HCT 32.5* 33.7* 30.1*  MCV 78.1 77.5* 77.2*  PLT 384 411* 337      Assessment/Plan: Chronic right-sided abdominal pain with anemia in the setting of a long-history of celiac disease. Hgb 9.4 (10.3). EGD/colonoscopy planned for tomorrow. Colon prep today. Clear liquid diet. NPO p MN.   [place] C. 01/23/2018, 2:26 PM  Questions please call [phone] ID: [name], female   DOB: 02/08/1929, 84 y.o.   MRN: [id number]

## 2018-01-23 NOTE — H&P (View-Only) (Signed)
[name] 84 y.o. 03/07/1929   Subjective: Felt nauseous this morning without vomiting. Complains of ongoing right-sided abdominal pain. Grandson in room.  Objective: Vital signs: Vitals:   01/22/18 2045 01/23/18 0519  BP: (!) 156/58 (!) 162/58  Pulse: 69 67  Resp: 20 18  Temp: 98.2 F (36.8 C) 98.2 F (36.8 C)  SpO2: 99% 99%    Physical Exam: Gen: lethargic, elderly, frail, well-nourished, no acute distress  HEENT: anicteric sclera CV: RRR Chest: CTA B Abd: right-sided tenderness with guarding, soft, mild distention, +BS Ext: no edema  Lab Results: Recent Labs    01/22/18 0543 01/23/18 0544  NA 136 138  K 3.9 3.9  CL 101 107  CO2 24 24  GLUCOSE 100* 95  BUN 31* 18  CREATININE 1.73* 1.20*  CALCIUM 8.7* 8.5*  MG 2.1  --    Recent Labs    01/21/18 0630 01/22/18 0543  AST 15 18  ALT 13 14  ALKPHOS 78 86  BILITOT 0.9 0.9  PROT 5.9* 6.4*  ALBUMIN 2.7* 2.8*   Recent Labs    01/21/18 0630 01/22/18 0543 01/23/18 0544  WBC 16.3* 15.2* 11.6*  NEUTROABS 6.5  --  4.8  HGB 10.2* 10.3* 9.4*  HCT 32.5* 33.7* 30.1*  MCV 78.1 77.5* 77.2*  PLT 384 411* 337      Assessment/Plan: Chronic right-sided abdominal pain with anemia in the setting of a long-history of celiac disease. Hgb 9.4 (10.3). EGD/colonoscopy planned for tomorrow. Colon prep today. Clear liquid diet. NPO p MN.   [place] C. 01/23/2018, 2:26 PM  Questions please call [phone] ID: [name], female   DOB: [birth year], 84 y.o.   MRN: [id number]

## 2018-01-24 ENCOUNTER — Encounter (HOSPITAL_COMMUNITY): Payer: Self-pay

## 2018-01-24 ENCOUNTER — Encounter (HOSPITAL_COMMUNITY)
Admission: EM | Disposition: A | Discharge status: Home / Self Care | Payer: Self-pay | Source: Home / Self Care | Attending: Internal Medicine

## 2018-01-24 ENCOUNTER — Inpatient Hospital Stay (HOSPITAL_COMMUNITY): Payer: Medicare Other | Admitting: Anesthesiology

## 2018-01-24 DIAGNOSIS — D649 Anemia, unspecified: Secondary | ICD-10-CM | POA: Diagnosis present

## 2018-01-24 DIAGNOSIS — R109 Unspecified abdominal pain: Secondary | ICD-10-CM | POA: Diagnosis present

## 2018-01-24 HISTORY — PX: ESOPHAGOGASTRODUODENOSCOPY (EGD) WITH PROPOFOL: SHX5813

## 2018-01-24 HISTORY — PX: COLONOSCOPY WITH PROPOFOL: SHX5780

## 2018-01-24 LAB — CBC WITH DIFFERENTIAL/PLATELET
BASOS ABS: 0.1 10*3/uL (ref 0.0–0.1)
BASOS PCT: 1 %
Eosinophils Absolute: 0.3 10*3/uL (ref 0.0–0.7)
Eosinophils Relative: 2 %
HEMATOCRIT: 32.4 % — AB (ref 36.0–46.0)
HEMOGLOBIN: 10 g/dL — AB (ref 12.0–15.0)
LYMPHS PCT: 53 %
Lymphs Abs: 7.4 10*3/uL — ABNORMAL HIGH (ref 0.7–4.0)
MCH: 24 pg — AB (ref 26.0–34.0)
MCHC: 30.9 g/dL (ref 30.0–36.0)
MCV: 77.7 fL — ABNORMAL LOW (ref 78.0–100.0)
Monocytes Absolute: 0.7 10*3/uL (ref 0.1–1.0)
Monocytes Relative: 5 %
NEUTROS ABS: 5.4 10*3/uL (ref 1.7–7.7)
Neutrophils Relative %: 39 %
Platelets: 357 10*3/uL (ref 150–400)
RBC: 4.17 MIL/uL (ref 3.87–5.11)
RDW: 18.7 % — ABNORMAL HIGH (ref 11.5–15.5)
WBC: 13.9 10*3/uL — ABNORMAL HIGH (ref 4.0–10.5)

## 2018-01-24 LAB — GLUCOSE, CAPILLARY
GLUCOSE-CAPILLARY: 89 mg/dL (ref 70–99)
GLUCOSE-CAPILLARY: 108 mg/dL — AB (ref 70–99)
GLUCOSE-CAPILLARY: 152 mg/dL — AB (ref 70–99)
Glucose-Capillary: 103 mg/dL — ABNORMAL HIGH (ref 70–99)

## 2018-01-24 LAB — BASIC METABOLIC PANEL
Anion gap: 8 (ref 5–15)
BUN: 12 mg/dL (ref 8–23)
CO2: 24 mmol/L (ref 22–32)
CREATININE: 1.16 mg/dL — AB (ref 0.44–1.00)
Calcium: 8.6 mg/dL — ABNORMAL LOW (ref 8.9–10.3)
Chloride: 108 mmol/L (ref 98–111)
GFR calc non Af Amer: 41 mL/min — ABNORMAL LOW (ref >60)
GFR, EST AFRICAN AMERICAN: 48 mL/min — AB (ref >60)
Glucose, Bld: 92 mg/dL (ref 70–99)
POTASSIUM: 3.7 mmol/L (ref 3.5–5.1)
SODIUM: 140 mmol/L (ref 135–145)

## 2018-01-24 SURGERY — ESOPHAGOGASTRODUODENOSCOPY (EGD) WITH PROPOFOL
Anesthesia: Monitor Anesthesia Care

## 2018-01-24 MED ORDER — PROPOFOL 10 MG/ML IV BOLUS
INTRAVENOUS | Status: DC | PRN
  Administered 2018-01-24: 30 mg via INTRAVENOUS
  Administered 2018-01-24: 10 mg via INTRAVENOUS

## 2018-01-24 MED ORDER — ENOXAPARIN SODIUM 40 MG/0.4ML ~~LOC~~ SOLN
40.0000 mg | SUBCUTANEOUS | Status: DC
  Administered 2018-01-24: 40 mg via SUBCUTANEOUS
  Filled 2018-01-24: qty 0.4

## 2018-01-24 MED ORDER — PROPOFOL 10 MG/ML IV BOLUS
INTRAVENOUS | Status: AC
  Filled 2018-01-24: qty 60

## 2018-01-24 MED ORDER — PROPOFOL 500 MG/50ML IV EMUL
INTRAVENOUS | Status: DC | PRN
  Administered 2018-01-24: 100 ug/kg/min via INTRAVENOUS

## 2018-01-24 MED ORDER — LACTATED RINGERS IV SOLN
INTRAVENOUS | Status: DC
  Administered 2018-01-24: 1000 mL via INTRAVENOUS

## 2018-01-24 SURGICAL SUPPLY — 25 items

## 2018-01-24 NOTE — Interval H&P Note (Signed)
History and Physical Interval Note:  01/24/2018 2:14 PM  [name]  has presented today for surgery, with the diagnosis of abdominal pain, anemia  The various methods of treatment have been discussed with the patient and family. After consideration of risks, benefits and other options for treatment, the patient has consented to  Procedure(s): ESOPHAGOGASTRODUODENOSCOPY (EGD) WITH PROPOFOL (N/A) COLONOSCOPY WITH PROPOFOL (N/A) as a surgical intervention .  The patient's history has been reviewed, patient examined, no change in status, stable for surgery.  I have reviewed the patient's chart and labs.  Questions were answered to the patient's satisfaction.     [place] C.

## 2018-01-24 NOTE — Op Note (Signed)
[facility] Patient Name: [name] Procedure Date: 01/24/2018 MRN: [id number] Attending MD: Lear Ng , MD Date of Birth: 14-Dec-1928 CSN: [id number] Age: 84 Admit Type: Inpatient Procedure:                Upper GI endoscopy Indications:              Generalized abdominal pain, Iron deficiency anemia Providers:                Lear Ng, MD, Cleda Daub, RN, Charolette Child, Technician, Pierce Alday CRNA, CRNA Referring MD:             hospital team Medicines:                Propofol per Anesthesia, Monitored Anesthesia Care Complications:            endoscopic trauma Estimated Blood Loss:     Estimated blood loss was minimal. Procedure:                Pre-Anesthesia Assessment:                           - Prior to the procedure, a History and Physical                            was performed, and patient medications and                            allergies were reviewed. The patient's tolerance of                            previous anesthesia was also reviewed. The risks                            and benefits of the procedure and the sedation                            options and risks were discussed with the patient.                            All questions were answered, and informed consent                            was obtained. Prior Anticoagulants: The patient has                            taken no previous anticoagulant or antiplatelet                            agents. ASA Grade Assessment: III - A patient with                            severe systemic disease. After reviewing the risks  and benefits, the patient was deemed in                            satisfactory condition to undergo the procedure.                           After obtaining informed consent, the endoscope was                            passed under direct vision. Throughout the   procedure, the patient's blood pressure, pulse, and                            oxygen saturations were monitored continuously. The                            [id number]-H190 ([id number]) Olympus adult endoscope was                            introduced through the mouth, and advanced to the                            second part of duodenum. The upper GI endoscopy was                            accomplished without difficulty. The patient                            tolerated the procedure well. Scope In: Scope Out: Findings:      The Z-line was regular and was found 38 cm from the incisors.      A widely patent and non-obstructing Schatzki ring was found at the       gastroesophageal junction.      Localized minimal inflammation characterized by congestion (edema) and       erythema was found in the prepyloric region of the stomach.      A small hiatal hernia was present.      The examined duodenum was normal. Impression:               - Z-line regular, 38 cm from the incisors.                           - Widely patent and non-obstructing Schatzki ring.                           - Acute gastritis.                           - Small hiatal hernia.                           - Normal examined duodenum.                           - No specimens collected. Moderate Sedation:      N/A- Per Anesthesia Care Recommendation:           -  Resume previous diet. Procedure Code(s):        --- Professional ---                           [phone], Esophagogastroduodenoscopy, flexible,                            transoral; diagnostic, including collection of                            specimen(s) by brushing or washing, when performed                            (separate procedure) Diagnosis Code(s):        --- Professional ---                           D50.9, Iron deficiency anemia, unspecified                           R10.84, Generalized abdominal pain                           K29.00, Acute gastritis without  bleeding                           K22.2, Esophageal obstruction                           K44.9, Diaphragmatic hernia without obstruction or                            gangrene CPT copyright 2017 [employer]. All rights reserved. The codes documented in this report are preliminary and upon coder review may  be revised to meet current compliance requirements. Lear Ng, MD 01/24/2018 3:08:27 PM This report has been signed electronically. Number of Addenda: 0

## 2018-01-24 NOTE — Transfer of Care (Signed)
Immediate Anesthesia Transfer of Care Note  Patient: [name]  Procedure(s) Performed: ESOPHAGOGASTRODUODENOSCOPY (EGD) WITH PROPOFOL (N/A ) COLONOSCOPY WITH PROPOFOL (N/A )  Patient Location: PACU  Anesthesia Type:MAC  Level of Consciousness: sedated  Airway & Oxygen Therapy: Patient Spontanous Breathing and Patient connected to nasal cannula oxygen  Post-op Assessment: Report given to RN and Post -op Vital signs reviewed and stable  Post vital signs: Reviewed and stable  Last Vitals:  Vitals Value Taken Time  BP    Temp    Pulse    Resp    SpO2      Last Pain:  Vitals:   01/24/18 1341  TempSrc: Oral  PainSc: 0-No pain      Patients Stated Pain Goal: 2 (87/86/76 7209)  Complications: No apparent anesthesia complications

## 2018-01-24 NOTE — Brief Op Note (Signed)
EGD/colon unrevealing for a source of the abdominal pain or anemia. Would manage conservatively. No additional GI procedures recommended due to advanced age. If anemia worsens, then consider outpt capsule endoscopy. Soft diet and advance as tolerated. Will sign off. Call if questions. F/U with Dr. Oletta Lamas in 3-4 weeks.

## 2018-01-24 NOTE — Care Management Note (Signed)
Case Management Note  Patient Details  Name: ANAHIT KLUMB MRN: [id number] Date of Birth: [birth year]  Subjective/Objective:            Rt sided abd. Pain in presence of celiacs disease.        Action/Plan:  Return to home no cm needs present anticipated dc today   Expected Discharge Date:  (unknown)               Expected Discharge Plan:  Home/Self Care  In-House Referral:     Discharge planning Services  CM Consult  Post Acute Care Choice:    Choice offered to:     DME Arranged:    DME Agency:     HH Arranged:    HH Agency:     Status of Service:  In process, will continue to follow  If discussed at Long Length of Stay Meetings, dates discussed:    Additional Comments:  Leeroy Cha, RN 01/24/2018, 11:44 AM

## 2018-01-24 NOTE — Anesthesia Preprocedure Evaluation (Addendum)
Anesthesia Evaluation  Patient identified by MRN, date of birth, ID band Patient awake    Reviewed: Allergy & Precautions, NPO status , Patient's Chart, lab work & pertinent test results  Airway Mallampati: II  TM Distance: >3 FB Neck ROM: Full    Dental  (+) Teeth Intact, Dental Advisory Given   Pulmonary pneumonia,    breath sounds clear to auscultation       Cardiovascular hypertension, Pt. on medications and Pt. on home beta blockers + CAD and + Peripheral Vascular Disease  + Valvular Problems/Murmurs  Rhythm:Regular Rate:Normal     Neuro/Psych negative neurological ROS  negative psych ROS   GI/Hepatic Neg liver ROS, GERD  Medicated,  Endo/Other  diabetes, Type 2  Renal/GU Renal disease     Musculoskeletal   Abdominal (+) + obese,   Peds  Hematology negative hematology ROS (+)   Anesthesia Other Findings   Reproductive/Obstetrics                             Anesthesia Physical Anesthesia Plan  ASA: III  Anesthesia Plan: MAC   Post-op Pain Management:    Induction: Intravenous  PONV Risk Score and Plan:   Airway Management Planned: Natural Airway and Nasal Cannula  Additional Equipment:   Intra-op Plan:   Post-operative Plan:   Informed Consent: I have reviewed the patients History and Physical, chart, labs and discussed the procedure including the risks, benefits and alternatives for the proposed anesthesia with the patient or authorized representative who has indicated his/her understanding and acceptance.     Plan Discussed with: CRNA  Anesthesia Plan Comments:         Anesthesia Quick Evaluation

## 2018-01-24 NOTE — Anesthesia Postprocedure Evaluation (Signed)
Anesthesia Post Note  Patient: [name]  Procedure(s) Performed: ESOPHAGOGASTRODUODENOSCOPY (EGD) WITH PROPOFOL (N/A ) COLONOSCOPY WITH PROPOFOL (N/A )     Patient location during evaluation: Endoscopy Anesthesia Type: MAC Level of consciousness: awake and alert Pain management: pain level controlled Vital Signs Assessment: post-procedure vital signs reviewed and stable Respiratory status: spontaneous breathing, nonlabored ventilation, respiratory function stable and patient connected to nasal cannula oxygen Cardiovascular status: stable and blood pressure returned to baseline Postop Assessment: no apparent nausea or vomiting Anesthetic complications: no    Last Vitals:  Vitals:   01/24/18 1450 01/24/18 1500  BP: (!) 160/46 (!) 177/58  Pulse: 79 75  Resp: 18 17  Temp:    SpO2: 99% 99%    Last Pain:  Vitals:   01/24/18 1500  TempSrc:   PainSc: 0-No pain                 [place]

## 2018-01-24 NOTE — Op Note (Signed)
[facility] Patient Name: [name] Procedure Date: 01/24/2018 MRN: [id number] Attending MD: Lear Ng , MD Date of Birth: 09-02-1928 CSN: [id number] Age: 84 Admit Type: Inpatient Procedure:                Colonoscopy Indications:              Last colonoscopy: 2014, Generalized abdominal pain,                            Iron deficiency anemia Providers:                Lear Ng, MD, Cleda Daub, RN, Charolette Child, Technician, [place] Alday CRNA, CRNA Referring MD:             hospital team Medicines:                Propofol per Anesthesia, Monitored Anesthesia Care Complications:            No immediate complications. Estimated Blood Loss:     Estimated blood loss: none. Procedure:                Pre-Anesthesia Assessment:                           - Prior to the procedure, a History and Physical                            was performed, and patient medications and                            allergies were reviewed. The patient's tolerance of                            previous anesthesia was also reviewed. The risks                            and benefits of the procedure and the sedation                            options and risks were discussed with the patient.                            All questions were answered, and informed consent                            was obtained. Prior Anticoagulants: The patient has                            taken no previous anticoagulant or antiplatelet                            agents. ASA Grade Assessment: III - A patient with  severe systemic disease. After reviewing the risks                            and benefits, the patient was deemed in                            satisfactory condition to undergo the procedure.                           After obtaining informed consent, the colonoscope                            was passed under direct vision.  Throughout the                            procedure, the patient's blood pressure, pulse, and                            oxygen saturations were monitored continuously. The                            PCF-H190DL ([id number]) Olympus peds colonoscope was                            introduced through the anus and advanced to the the                            cecum, identified by appendiceal orifice and                            ileocecal valve. The colonoscopy was performed                            without difficulty. The patient tolerated the                            procedure well. The quality of the bowel                            preparation was fair and fair but repeated                            irrigation led to a good and adequate prep. The                            ileocecal valve, appendiceal orifice, and rectum                            were photographed. Scope In: 2:30:48 PM Scope Out: 2:38:20 PM Scope Withdrawal Time: 0 hours 4 minutes 8 seconds  Total Procedure Duration: 0 hours 7 minutes 32 seconds  Findings:      The perianal and digital rectal examinations were normal.      Multiple small and large-mouthed diverticula were found in the entire  colon.      Internal hemorrhoids were found during retroflexion. The hemorrhoids       were small and Grade I (internal hemorrhoids that do not prolapse).      The exam was otherwise normal throughout the examined colon. Impression:               - Preparation of the colon was fair.                           - Diverticulosis in the entire examined colon.                           - Internal hemorrhoids.                           - No specimens collected. Moderate Sedation:      N/A- Per Anesthesia Care Recommendation:           - Soft diet and advance diet as tolerated to high                            fiber diet.                           - Repeat colonoscopy is not recommended for                             screening purposes. Procedure Code(s):        --- Professional ---                           [phone], Colonoscopy, flexible; diagnostic, including                            collection of specimen(s) by brushing or washing,                            when performed (separate procedure) Diagnosis Code(s):        --- Professional ---                           D50.9, Iron deficiency anemia, unspecified                           R10.84, Generalized abdominal pain                           K64.0, First degree hemorrhoids                           K57.30, Diverticulosis of large intestine without                            perforation or abscess without bleeding CPT copyright 2017 [employer] Association. All rights reserved. The codes documented in this report are preliminary and upon coder review may  be revised to meet current compliance requirements. Lear Ng, MD 01/24/2018 3:13:04 PM This report has been signed  electronically. Number of Addenda: 0

## 2018-01-24 NOTE — Progress Notes (Signed)
PROGRESS NOTE  [name] TFT:[id number] DOB: 13-Nov-1928 DOA: 01/21/2018 PCP: Hulan Fess, MD  HPI/Recap of past 22 hours: 84 year old female with medical history significant for CAD status post CABG, hypertension, GERD, polymyalgia rheumatica, recent right ureteral stent placement in 11/2017, presented with persistent right lower quadrant abdominal pain since June of this year.  Patient reported some diarrhea with dark stools, usually after every time she eats food.  Noted some nausea and vomiting as well denies any dysuria fever/chills, chest pain, shortness of breath.  Patient admitted for further management.  Today, patient denies any new complaints. Still with some mild RLQ abdominal pain, not in any distress.  Assessment/Plan: Active Problems:   Acute kidney injury ([place])   Urinary tract infection without hematuria   AKI (acute kidney injury) ([place])   Anemia   Abdominal pain, generalized  Chronic right sided abdominal pain/Hx of celiac dsz Ongoing since 11/2017, occasional nausea/vomiting/diarrhea Afebrile, with leukocytosis (on chronic steroid) LFTs within normal limits Had C. difficile tested on the outside, negative BC X 2 NGTD CT abdomen pelvis showed right ureteral stent in satisfactory position, with some fullness of the right renal collecting system and proximal right ureter likely related to reflux from the bladder.  No gallstones, gallbladder Farro thickening or biliary dilatation Has been seen by Dr. Earlean Shawl [facility])- RUQ USS showed biliary sludge without evidence of cholecystitis.  Planned to have EGD/colonoscopy Patient and family requesting to be seen by [facility] physicians so as to transfer/establish care. [facility] on board: Unrevealing source of abdominal pain or anemia. May consider out capsule endoscopy if anemia persists. Follow up with Dr Oletta Lamas in 3-4 weeks  EGD done on 01/24/18 showed widely patent and non-obstructing schatzki ring, acute gastritis, small  hiatal hernia Colonoscopy done showed diverticulosis in the entire examined colon, internal hemorrhoids Advance diet  AKI on CKD stage III Resolved Baseline creatinine around 1.4-->2.1 on admission  UTI UA showed WBC, positive leukocytes UC with 60,000 GBS (strep agalactiae) BC x 2, NGTD S/P IV Rocephin, will switch to PO keflex for a total of 5 days  Papillary tumor in right distal ureter with proximal hydronephrosis Status post stent placement in 11/2017 Follows with urology as outpatient (urology reported abdominal pain unlikely due to papillary tumor) Plan to biopsy as an outpatient  Anemia of CKD Hemoglobin baseline around 11-12--->10.2 on admission Iron panel shows iron 21, sats 7%, ferritin 16, folate 21.8, vitamin B12 560 Iron supplementation Daily CBC  Hypertension Stable Continue amlodipine, Coreg, clonidine Held ARB, Lasix  Chronic diastolic HF Appears euvolemic ECHO in 2014: Showed EF of 60 to 70%, grade 2 diastolic dysfunction Continue to hold Lasix  CAD status post CABG Chest pain-free Continue aspirin, Coreg  Hypothyroidism Continue Synthroid  DM type II A1c 5.9 Diet controlled SSI, Accu-Cheks  GERD PPI  Polymyalgia rheumatica Continue steroids       Code Status: Full  Family Communication: None at bedside  Disposition Plan: Discharge home on 01/25/18   Consultants:  GI  Procedures:  EGD  Colonoscopy  Antimicrobials:  PO Keflex  DVT prophylaxis: Lovenox   Objective: Vitals:   01/24/18 1341 01/24/18 1445 01/24/18 1450 01/24/18 1500  BP: (!) 154/57 (!) 144/59 (!) 160/46 (!) 177/58  Pulse: 83 70 79 75  Resp: 17 (!) 28 18 17   Temp: 98.5 F (36.9 C) 97.6 F (36.4 C)    TempSrc: Oral Oral    SpO2: 93% 99% 99% 99%  Weight: 79.8 kg  Height: 5\' 4"  (1.626 m)       Intake/Output Summary (Last 24 hours) at 01/24/2018 1533 Last data filed at 01/24/2018 1441 Gross per 24 hour  Intake 2771.06 ml  Output -  Net 2771.06  ml   Filed Weights   01/23/18 0519 01/24/18 0500 01/24/18 1341  Weight: 81.9 kg 84 kg 79.8 kg    Exam:   General: NAD  Cardiovascular: S1, S2 present  Respiratory: CTA B  Abdomen: Soft, mild TTP on RLQ, ND, BS +  Musculoskeletal: No pedal edema bilaterally  Skin: Normal  Psychiatry: Normal mood   Data Reviewed: CBC: Recent Labs  Lab 01/21/18 0630 01/22/18 0543 01/23/18 0544 01/24/18 0549  WBC 16.3* 15.2* 11.6* 13.9*  NEUTROABS 6.5  --  4.8 5.4  HGB 10.2* 10.3* 9.4* 10.0*  HCT 32.5* 33.7* 30.1* 32.4*  MCV 78.1 77.5* 77.2* 77.7*  PLT 384 411* 337 109   Basic Metabolic Panel: Recent Labs  Lab 01/21/18 0630 01/22/18 0543 01/23/18 0544 01/24/18 0549  NA 136 136 138 140  K 3.9 3.9 3.9 3.7  CL 103 101 107 108  CO2 22 24 24 24   GLUCOSE 101* 100* 95 92  BUN 37* 31* 18 12  CREATININE 2.10* 1.73* 1.20* 1.16*  CALCIUM 8.2* 8.7* 8.5* 8.6*  MG  --  2.1  --   --    GFR: Estimated Creatinine Clearance: 34.9 mL/min (A) (by C-G formula based on SCr of 1.16 mg/dL (H)). Liver Function Tests: Recent Labs  Lab 01/21/18 0630 01/22/18 0543  AST 15 18  ALT 13 14  ALKPHOS 78 86  BILITOT 0.9 0.9  PROT 5.9* 6.4*  ALBUMIN 2.7* 2.8*   Recent Labs  Lab 01/21/18 0630  LIPASE 30   No results for input(s): AMMONIA in the last 168 hours. Coagulation Profile: No results for input(s): INR, PROTIME in the last 168 hours. Cardiac Enzymes: No results for input(s): CKTOTAL, CKMB, CKMBINDEX, TROPONINI in the last 168 hours. BNP (last 3 results) No results for input(s): PROBNP in the last 8760 hours. HbA1C: No results for input(s): HGBA1C in the last 72 hours. CBG: Recent Labs  Lab 01/23/18 1120 01/23/18 1706 01/23/18 2029 01/24/18 0802 01/24/18 1228  GLUCAP 110* 143* 114* 89 108*   Lipid Profile: No results for input(s): CHOL, HDL, LDLCALC, TRIG, CHOLHDL, LDLDIRECT in the last 72 hours. Thyroid Function Tests: No results for input(s): TSH, T4TOTAL, FREET4,  T3FREE, THYROIDAB in the last 72 hours. Anemia Panel: Recent Labs    01/22/18 0543  VITAMINB12 560  FOLATE 21.8  FERRITIN 16  TIBC 314  IRON 21*   Urine analysis:    Component Value Date/Time   COLORURINE YELLOW 01/21/2018 0416   APPEARANCEUR HAZY (A) 01/21/2018 0416   LABSPEC 1.010 01/21/2018 0416   PHURINE 6.0 01/21/2018 0416   GLUCOSEU 50 (A) 01/21/2018 0416   HGBUR LARGE (A) 01/21/2018 0416   BILIRUBINUR NEGATIVE 01/21/2018 0416   KETONESUR NEGATIVE 01/21/2018 0416   PROTEINUR 100 (A) 01/21/2018 0416   UROBILINOGEN 0.2 07/08/2010 1238   NITRITE NEGATIVE 01/21/2018 0416   LEUKOCYTESUR LARGE (A) 01/21/2018 0416   Sepsis Labs: @LABRCNTIP (procalcitonin:4,lacticidven:4)  ) Recent Results (from the past 240 hour(s))  Culture, Urine     Status: Abnormal   Collection Time: 01/21/18  4:16 AM  Result Value Ref Range Status   Specimen Description   Final    URINE, CLEAN CATCH Performed at [facility], [place] [address] [place], [place] [address]    Special  Requests   Final    NONE Performed at [facility], [place] [address] [place], [place] [address]    Culture (A)  Final    60,000 COLONIES/mL GROUP B STREP(S.AGALACTIAE)ISOLATED TESTING AGAINST S. AGALACTIAE NOT ROUTINELY PERFORMED DUE TO PREDICTABILITY OF AMP/PEN/VAN SUSCEPTIBILITY. Performed at [facility], [place] [address], [place], [place] [address]    Report Status 01/22/2018 FINAL  Final  Culture, blood (routine x 2)     Status: None (Preliminary result)   Collection Time: 01/21/18 10:03 AM  Result Value Ref Range Status   Specimen Description   Final    BLOOD LEFT HAND Performed at [facility] [address], [place], [place] [address]    Special Requests   Final    BOTTLES DRAWN AEROBIC AND ANAEROBIC Blood Culture results may not be optimal due to an excessive volume of blood received in culture bottles Performed at [place] [address], [place], [place] [address]    Culture   Final    NO GROWTH 3 DAYS Performed at [facility], [place] [address], [place], [place] [address]    Report Status PENDING  Incomplete  Culture, blood (routine x 2)     Status: None (Preliminary result)   Collection Time: 01/21/18 10:04 AM  Result Value Ref Range Status   Specimen Description   Final    BLOOD RIGHT HAND Performed at [place] [address], [place], [place] [address]    Special Requests   Final    BOTTLES DRAWN AEROBIC AND ANAEROBIC Blood Culture adequate volume Performed at [facility] [address], [place], [place] [address]    Culture   Final    NO GROWTH 3 DAYS Performed at [facility], [place] [address], [place], [place] [address]    Report Status PENDING  Incomplete      Studies: No results found.  Scheduled Meds: . amLODipine  10 mg Oral Daily  . aspirin EC  81 mg Oral Q1500  . carvedilol  1.5625 mg Oral BID WC  . cephALEXin  500 mg Oral Q12H  . cloNIDine  0.1 mg Oral BID  . ferrous sulfate  325 mg Oral TID WC  . insulin aspart  0-5 Units Subcutaneous QHS  . insulin aspart  0-9 Units Subcutaneous TID WC  . levothyroxine  50 mcg Oral Q0600  . pantoprazole  40 mg Oral QAC breakfast  . predniSONE  8 mg Oral Daily  . pyridOXINE  100 mg Oral Weekly  . vitamin B-12  500 mcg Oral Weekly    Continuous Infusions: . sodium chloride       LOS: 2 days     Alma Friendly, MD [facility]   If 7PM-7AM, please contact night-coverage [email or web address] Password [facility] 01/24/2018, 3:33 PM

## 2018-01-25 DIAGNOSIS — R030 Elevated blood-pressure reading, without diagnosis of hypertension: Secondary | ICD-10-CM

## 2018-01-25 DIAGNOSIS — D72829 Elevated white blood cell count, unspecified: Secondary | ICD-10-CM

## 2018-01-25 DIAGNOSIS — R1084 Generalized abdominal pain: Secondary | ICD-10-CM

## 2018-01-25 DIAGNOSIS — N200 Calculus of kidney: Secondary | ICD-10-CM

## 2018-01-25 DIAGNOSIS — R112 Nausea with vomiting, unspecified: Secondary | ICD-10-CM

## 2018-01-25 DIAGNOSIS — N132 Hydronephrosis with renal and ureteral calculous obstruction: Secondary | ICD-10-CM

## 2018-01-25 LAB — GLUCOSE, CAPILLARY
GLUCOSE-CAPILLARY: 83 mg/dL (ref 70–99)
Glucose-Capillary: 112 mg/dL — ABNORMAL HIGH (ref 70–99)

## 2018-01-25 MED ORDER — CEPHALEXIN 500 MG PO CAPS: 500.0000 mg | ORAL_CAPSULE | Freq: Two times a day (BID) | ORAL | 0 refills | Status: AC

## 2018-01-25 MED ORDER — FERROUS SULFATE 325 (65 FE) MG PO TABS: 325.0000 mg | ORAL_TABLET | Freq: Two times a day (BID) | ORAL | 0 refills | Status: AC

## 2018-01-25 NOTE — Progress Notes (Signed)
Discharge instructions reviewed with patient and daughter. All questions answered. Patient wheeled down to vehicle with belongings by nurse tech.

## 2018-01-25 NOTE — Discharge Summary (Signed)
Discharge Summary  [name] ZJQ:[id number] DOB: 06-07-1929  PCP: Hulan Fess, MD  Admit date: 01/21/2018 Discharge date: 01/25/2018  Time spent: 40 mins  Recommendations for Outpatient Follow-up:  1. PCP 2. GI  Discharge Diagnoses:  Active Hospital Problems   Diagnosis Date Noted  . Anemia 01/24/2018  . Abdominal pain, generalized 01/24/2018  . AKI (acute kidney injury) ([place]) 01/22/2018  . Acute kidney injury ([place]) 01/21/2018  . Urinary tract infection without hematuria     Resolved Hospital Problems  No resolved problems to display.    Discharge Condition: Stable  Diet recommendation: Gluten free  Vitals:   01/25/18 0437 01/25/18 0934  BP: (!) 150/51 (!) 145/53  Pulse: 75   Resp: 18   Temp: 98.5 F (36.9 C)   SpO2: 90%     History of present illness:  84 year old female with medical history significant for CAD status post CABG, hypertension, GERD, polymyalgia rheumatica, recent right ureteral stent placement in 11/2017, presented with persistent right lower quadrant abdominal pain since June of this year.  Patient reported some diarrhea with dark stools, usually after every time she eats food.  Noted some nausea and vomiting as well denies any dysuria fever/chills, chest pain, shortness of breath.  Patient admitted for further management.  Today, patient denies any new complaints. Still with some mild RLQ abdominal pain, not in any distress. Stable for discharge with follow up with PCP and GI  Hospital Course:  Active Problems:   Acute kidney injury ([place])   Urinary tract infection without hematuria   AKI (acute kidney injury) ([place])   Anemia   Abdominal pain, generalized  Chronic right sided abdominal pain/Hx of celiac dsz Ongoing since 11/2017, occasional nausea/vomiting/diarrhea Afebrile, with leukocytosis (on chronic steroid) LFTs within normal limits Had C. difficile tested on the outside, negative BC X 2 NGTD CT abdomen pelvis showed right ureteral  stent in satisfactory position, with some fullness of the right renal collecting system and proximal right ureter likely related to reflux from the bladder.  No gallstones, gallbladder Wichert thickening or biliary dilatation Has been seen by Dr. Earlean Shawl [facility])- RUQ USS showed biliary sludge without evidence of cholecystitis.  Planned to have EGD/colonoscopy Patient and family requesting to be seen by [facility] GI physicians so as to transfer/establish care. [facility] on board: Unrevealing source of abdominal pain or anemia. May consider out capsule endoscopy if anemia persists. Follow up with Dr Oletta Lamas in 3-4 weeks  EGD done on 01/24/18 showed widely patent and non-obstructing schatzki ring, acute gastritis, small hiatal hernia Colonoscopy done showed diverticulosis in the entire examined colon, internal hemorrhoids Follow up with PCP and GI  AKI on CKD stage III Resolved Baseline creatinine around 1.4-->2.1 on admission  UTI UA showed WBC, positive leukocytes UC with 60,000 GBS (strep agalactiae) BC x 2, NGTD S/P IV Rocephin, switched to PO keflex for a total of 5 days  Papillary tumor in right distal ureter with proximal hydronephrosis Status post stent placement in 11/2017 Follows with urology as outpatient (urology reported abdominal pain unlikely due to papillary tumor) Plan to biopsy as an outpatient  Anemia of CKD Hemoglobin baseline around 11-12--->10.2 on admission Iron panel shows iron 21, sats 7%, ferritin 16, folate 21.8, vitamin B12 560 Iron supplementation  Hypertension Stable Continue amlodipine, Coreg, clonidine, ARB  Chronic diastolic HF Appears euvolemic ECHO in 2014: Showed EF of 60 to 24%, grade 2 diastolic dysfunction Continue to home Lasix  CAD status post CABG Chest pain-free Continue aspirin,  Coreg  Hypothyroidism Continue Synthroid  DM type II A1c 5.9 Diet controlled  GERD PPI  Polymyalgia rheumatica Continue  steroids   Procedures:  EGD  Colonoscopy  Consultations:  GI  Discharge Exam: BP (!) 145/53   Pulse 75   Temp 98.5 F (36.9 C) (Oral)   Resp 18   Ht 5\' 4"  (1.626 m)   Wt 80.5 kg   SpO2 90%   BMI 30.46 kg/m   General: NAD Cardiovascular: S1, S2 present Respiratory: CTAB  Discharge Instructions You were cared for by a hospitalist during your hospital stay. If you have any questions about your discharge medications or the care you received while you were in the hospital after you are discharged, you can call the unit and asked to speak with the hospitalist on call if the hospitalist that took care of you is not available. Once you are discharged, your primary care physician will handle any further medical issues. Please note that NO REFILLS for any discharge medications will be authorized once you are discharged, as it is imperative that you return to your primary care physician (or establish a relationship with a primary care physician if you do not have one) for your aftercare needs so that they can reassess your need for medications and monitor your lab values.   Allergies as of 01/25/2018      Reactions   Accutane [isotretinoin] Other (See Comments)   Affected liver   Ciprofloxacin Rash, Other (See Comments)   Kidney failure   Erythromycin Diarrhea, Rash   Gluten Diarrhea, Other (See Comments)   Celiac sprue   Gluten Meal Diarrhea, Rash   Celiac sprue intolerable   Isotretinoin Other (See Comments)   Affects liver   Lipitor [atorvastatin] Other (See Comments)   Affects legs   Lisinopril Other (See Comments)   Pt doesn't remember reaction   Lovastatin Other (See Comments)   Affects legs   Penicillins Other (See Comments)   From childhood Has patient had a PCN reaction causing immediate rash, facial/tongue/throat swelling, SOB or lightheadedness with hypotension: Unknown Has patient had a PCN reaction causing severe rash involving mucus membranes or skin  necrosis: Unknown Has patient had a PCN reaction that required hospitalization: Unknown Has patient had a PCN reaction occurring within the last 10 years: No If all of the above answers are "NO", then may proceed with Cephalosporin use.   Phenytoin Sodium Extended Other (See Comments)   Affects liver   Pravastatin Other (See Comments)   Affected legs   Crestor [rosuvastatin]    Muscle aches even on 5mg  once weekly   Gabapentin Other (See Comments)   Shaky, swollen legs   Hydrocodone Other (See Comments)   Restless leg   Metoprolol Itching, Swelling   Prolia [denosumab] Other (See Comments)   Joints and muscle aches, loss of hair   Boniva [ibandronic Acid] Other (See Comments)   headaches   Clarithromycin Rash   Hydralazine Rash   Pt does not know what the reactions were.   Lescol [fluvastatin Sodium] Other (See Comments)   Affects legs   Sulfa Antibiotics Rash   Sulfasalazine Rash   Welchol [colesevelam Hcl] Other (See Comments)   No energy   Zetia [ezetimibe] Other (See Comments)   myalgias   Zithromax [azithromycin] Rash      Medication List    STOP taking these medications   sulfamethoxazole-trimethoprim 800-160 MG tablet Commonly known as:  BACTRIM DS,SEPTRA DS     TAKE these medications  amLODipine 10 MG tablet Commonly known as:  NORVASC Take 1 tablet (10 mg total) by mouth daily.   aspirin EC 81 MG tablet Take 81 mg by mouth daily at 3 pm.   azelastine 0.1 % nasal spray Commonly known as:  ASTELIN Place 1 spray into both nostrils 2 (two) times daily. Use in each nostril as directed   Biotin 5000 MCG Tabs Take 5,000 mcg by mouth once a week. On Tuesday   carvedilol 3.125 MG tablet Commonly known as:  COREG Take 1.5625 mg by mouth 2 (two) times daily.   cephALEXin 500 MG capsule Commonly known as:  KEFLEX Take 1 capsule (500 mg total) by mouth every 12 (twelve) hours for 1 dose.   Cholecalciferol 4000 units Caps Take 4,000 Units by mouth daily at  3 pm.   CITRACAL PO Take 1 tablet by mouth daily at 3 pm.   cloNIDine 0.1 MG tablet Commonly known as:  CATAPRES Take 0.1 mg by mouth 2 (two) times daily.   ferrous sulfate 325 (65 FE) MG tablet Take 1 tablet (325 mg total) by mouth 2 (two) times daily with a meal.   furosemide 40 MG tablet Commonly known as:  LASIX Take 1 tablet (40 mg total) by mouth daily. Morning and mid evening What changed:  when to take this   irbesartan 300 MG tablet Commonly known as:  AVAPRO Take 300 mg by mouth daily.   Levothyroxine Sodium 50 MCG Caps Take 1 capsule by mouth daily.   METRONIDAZOLE (TOPICAL) 0.75 % Lotn Apply 1 application topically at bedtime as needed (rosacea).   multivitamin with minerals Tabs tablet Take 1 tablet by mouth daily at 3 pm.   ondansetron 4 MG tablet Commonly known as:  ZOFRAN Take 4 mg by mouth every 8 (eight) hours as needed for nausea. for nausea   pantoprazole 40 MG tablet Commonly known as:  PROTONIX Take 40 mg by mouth daily before breakfast.   potassium chloride 8 MEQ tablet Commonly known as:  KLOR-CON Take 8 mEq by mouth daily.   predniSONE 5 MG tablet Commonly known as:  DELTASONE Take 5 mg by mouth daily. Take with 1 mg tablets   predniSONE 1 MG tablet Commonly known as:  DELTASONE Take 3 mg by mouth daily. Take with 5 mg tablet   pyridOXINE 100 MG tablet Commonly known as:  VITAMIN B-6 Take 100 mg by mouth once a week. On Thursday   traMADol 50 MG tablet Commonly known as:  ULTRAM Take 50 mg by mouth every 4 (four) hours as needed for moderate pain.   vitamin B-12 500 MCG tablet Commonly known as:  CYANOCOBALAMIN Take 500 mcg by mouth once a week. On Thursdays   VITAMIN C PO Take 1 tablet by mouth daily at 3 pm.      Allergies  Allergen Reactions  . Accutane [Isotretinoin] Other (See Comments)    Affected liver  . Ciprofloxacin Rash and Other (See Comments)    Kidney failure  . Erythromycin Diarrhea and Rash  . Gluten  Diarrhea and Other (See Comments)    Celiac sprue  . Gluten Meal Diarrhea and Rash    Celiac sprue intolerable  . Isotretinoin Other (See Comments)    Affects liver  . Lipitor [Atorvastatin] Other (See Comments)    Affects legs  . Lisinopril Other (See Comments)    Pt doesn't remember reaction   . Lovastatin Other (See Comments)    Affects legs  . Penicillins Other (See  Comments)    From childhood Has patient had a PCN reaction causing immediate rash, facial/tongue/throat swelling, SOB or lightheadedness with hypotension: Unknown Has patient had a PCN reaction causing severe rash involving mucus membranes or skin necrosis: Unknown Has patient had a PCN reaction that required hospitalization: Unknown Has patient had a PCN reaction occurring within the last 10 years: No If all of the above answers are "NO", then may proceed with Cephalosporin use.  Marland Kitchen Phenytoin Sodium Extended Other (See Comments)    Affects liver  . Pravastatin Other (See Comments)    Affected legs  . Crestor [Rosuvastatin]     Muscle aches even on 5mg  once weekly  . Gabapentin Other (See Comments)    Shaky, swollen legs  . Hydrocodone Other (See Comments)    Restless leg  . Metoprolol Itching and Swelling  . Prolia [Denosumab] Other (See Comments)    Joints and muscle aches, loss of hair  . Boniva [Ibandronic Acid] Other (See Comments)    headaches  . Clarithromycin Rash  . Hydralazine Rash    Pt does not know what the reactions were.  Marland Kitchen Lescol [Fluvastatin Sodium] Other (See Comments)    Affects legs  . Sulfa Antibiotics Rash  . Sulfasalazine Rash  . Welchol [Colesevelam Hcl] Other (See Comments)    No energy  . Zetia [Ezetimibe] Other (See Comments)    myalgias  . Zithromax [Azithromycin] Rash   Follow-up Information    Little, [name], MD. Schedule an appointment as soon as possible for a visit in 1 week(s).   Specialty:  Family Medicine Contact information: [place]  [address] [phone]        Sueanne Margarita, MD .   Specialty:  Cardiology Contact information: [phone] [address] [place] [address] [phone]        Laurence Spates, MD. Schedule an appointment as soon as possible for a visit in 4 week(s).   Specialty:  Gastroenterology Contact information: [address] [place] [address] [phone]            The results of significant diagnostics from this hospitalization (including imaging, microbiology, ancillary and laboratory) are listed below for reference.    Significant Diagnostic Studies: Ct Abdomen Pelvis Wo Contrast  Result Date: 01/21/2018 CLINICAL DATA:  Abdominal pain with nausea and vomiting, history of recent stent placement EXAM: CT ABDOMEN AND PELVIS WITHOUT CONTRAST TECHNIQUE: Multidetector CT imaging of the abdomen and pelvis was performed following the standard protocol without IV contrast. COMPARISON:  12/25/2017 FINDINGS: Lower chest: No acute abnormality. Hepatobiliary: No focal liver abnormality is seen. No gallstones, gallbladder Rorrer thickening, or biliary dilatation. Pancreas: Unremarkable. No pancreatic ductal dilatation or surrounding inflammatory changes. Spleen: Normal in size without focal abnormality. Adrenals/Urinary Tract: Kidneys are well visualized bilaterally. Diffuse cortical thinning is noted on the left similar to that seen on the prior exam. Scattered renal cystic changes are noted. No definitive renal calculi are seen. Fullness of the right renal collecting system and proximal right ureter are again identified. A right ureteral stent is noted in satisfactory position. The bladder is well distended. Stomach/Bowel: Diverticular change of the colon is noted without evidence of diverticulitis. The appendix is within normal limits. No obstructive or inflammatory changes are seen. Vascular/Lymphatic: Aortic atherosclerosis. No enlarged abdominal or pelvic lymph nodes. A few  small retroperitoneal lymph nodes are identified not significant by size criteria. These are stable from the prior exam. Reproductive: Status post hysterectomy.  Other: No abdominal Wemhoff hernia or abnormality. No abdominopelvic ascites. Musculoskeletal: Degenerative changes of the lumbar spine are seen. No acute bony abnormality is noted. IMPRESSION: Right ureteral stent in satisfactory position. There remains some fullness of the right renal collecting system and proximal right ureter likely related to reflux from the bladder. The known ureteral lesion is not well appreciated. Chronic changes as described above. Electronically Signed   By: Inez Catalina M.D.   On: 01/21/2018 07:51   Dg Chest 2 View  Result Date: 01/21/2018 CLINICAL DATA:  Pain and nausea.  Hypertension. EXAM: CHEST - 2 VIEW COMPARISON:  January 25, 2015 FINDINGS: There is no edema or consolidation. Heart is upper normal in size with pulmonary vascularity normal. No adenopathy. Patient is status post coronary artery bypass grafting. There is aortic atherosclerosis. There is thoracic dextroscoliosis. There is degenerative change throughout the thoracic spine. IMPRESSION: No edema or consolidation. Heart size within normal limits. Status post coronary artery bypass grafting. There is aortic atherosclerosis. Aortic Atherosclerosis (ICD10-I70.0). Electronically Signed   By: Lowella Grip III M.D.   On: 01/21/2018 09:14    Microbiology: Recent Results (from the past 240 hour(s))  Culture, Urine     Status: Abnormal   Collection Time: 01/21/18  4:16 AM  Result Value Ref Range Status   Specimen Description   Final    URINE, CLEAN CATCH Performed at [facility], [place] [address] [place], [place] [address]    Special Requests   Final    NONE Performed at [facility], [place] [address], [place], [place] [address]    Culture (A)  Final    60,000 COLONIES/mL GROUP B STREP(S.AGALACTIAE)ISOLATED TESTING  AGAINST S. AGALACTIAE NOT ROUTINELY PERFORMED DUE TO PREDICTABILITY OF AMP/PEN/VAN SUSCEPTIBILITY. Performed at [facility], [place] [address], [place], [place] [address]    Report Status 01/22/2018 FINAL  Final  Culture, blood (routine x 2)     Status: None (Preliminary result)   Collection Time: 01/21/18 10:03 AM  Result Value Ref Range Status   Specimen Description   Final    BLOOD LEFT HAND Performed at [facility] [address] [place], [place] [address]    Special Requests   Final    BOTTLES DRAWN AEROBIC AND ANAEROBIC Blood Culture results may not be optimal due to an excessive volume of blood received in culture bottles Performed at [facility] [address], [place], [place] [address]    Culture   Final    NO GROWTH 4 DAYS Performed at [facility], [place] [address], [place], [place] [address]    Report Status PENDING  Incomplete  Culture, blood (routine x 2)     Status: None (Preliminary result)   Collection Time: 01/21/18 10:04 AM  Result Value Ref Range Status   Specimen Description   Final    BLOOD RIGHT HAND Performed at [facility] [address], [place], [place] [address]    Special Requests   Final    BOTTLES DRAWN AEROBIC AND ANAEROBIC Blood Culture adequate volume Performed at [facility] [address] [place], [place] [address]    Culture   Final    NO GROWTH 4 DAYS Performed at [facility], [place] [address] [place], [place] [address]    Report Status PENDING  Incomplete     Labs: Basic Metabolic Panel: Recent Labs  Lab 01/21/18 0630 01/22/18 0543 01/23/18 0544 01/24/18  0549  NA 136 136 138 140  K 3.9 3.9 3.9 3.7  CL 103 101 107 108  CO2 22 24 24 24   GLUCOSE 101* 100* 95 92  BUN 37* 31* 18 12  CREATININE 2.10* 1.73* 1.20* 1.16*  CALCIUM 8.2* 8.7* 8.5* 8.6*  MG  --  2.1  --   --    Liver Function Tests: Recent Labs  Lab 01/21/18 0630  01/22/18 0543  AST 15 18  ALT 13 14  ALKPHOS 78 86  BILITOT 0.9 0.9  PROT 5.9* 6.4*  ALBUMIN 2.7* 2.8*   Recent Labs  Lab 01/21/18 0630  LIPASE 30   No results for input(s): AMMONIA in the last 168 hours. CBC: Recent Labs  Lab 01/21/18 0630 01/22/18 0543 01/23/18 0544 01/24/18 0549  WBC 16.3* 15.2* 11.6* 13.9*  NEUTROABS 6.5  --  4.8 5.4  HGB 10.2* 10.3* 9.4* 10.0*  HCT 32.5* 33.7* 30.1* 32.4*  MCV 78.1 77.5* 77.2* 77.7*  PLT 384 411* 337 357   Cardiac Enzymes: No results for input(s): CKTOTAL, CKMB, CKMBINDEX, TROPONINI in the last 168 hours. BNP: BNP (last 3 results) No results for input(s): BNP in the last 8760 hours.  ProBNP (last 3 results) No results for input(s): PROBNP in the last 8760 hours.  CBG: Recent Labs  Lab 01/24/18 0802 01/24/18 1228 01/24/18 1623 01/24/18 2013 01/25/18 0744  GLUCAP 89 108* 103* 152* 83       Signed:  Alma Friendly, MD [facility] 01/25/2018, 11:48 AM

## 2018-01-26 DIAGNOSIS — N200 Calculus of kidney: Secondary | ICD-10-CM | POA: Diagnosis not present

## 2018-01-26 DIAGNOSIS — D72829 Elevated white blood cell count, unspecified: Secondary | ICD-10-CM | POA: Diagnosis not present

## 2018-01-26 DIAGNOSIS — R112 Nausea with vomiting, unspecified: Secondary | ICD-10-CM | POA: Diagnosis not present

## 2018-01-26 DIAGNOSIS — N132 Hydronephrosis with renal and ureteral calculous obstruction: Secondary | ICD-10-CM | POA: Diagnosis not present

## 2018-01-26 LAB — CULTURE, BLOOD (ROUTINE X 2)
CULTURE: NO GROWTH
Culture: NO GROWTH
Special Requests: ADEQUATE

## 2018-01-28 ENCOUNTER — Encounter (HOSPITAL_COMMUNITY): Payer: Self-pay | Admitting: Gastroenterology

## 2018-01-29 DIAGNOSIS — M2550 Pain in unspecified joint: Secondary | ICD-10-CM | POA: Diagnosis not present

## 2018-01-29 DIAGNOSIS — E663 Overweight: Secondary | ICD-10-CM | POA: Diagnosis not present

## 2018-01-29 DIAGNOSIS — Z6829 Body mass index (BMI) 29.0-29.9, adult: Secondary | ICD-10-CM | POA: Diagnosis not present

## 2018-01-29 DIAGNOSIS — M353 Polymyalgia rheumatica: Secondary | ICD-10-CM | POA: Diagnosis not present

## 2018-01-30 ENCOUNTER — Encounter: Payer: Self-pay | Admitting: Cardiology

## 2018-01-30 DIAGNOSIS — I10 Essential (primary) hypertension: Secondary | ICD-10-CM | POA: Diagnosis not present

## 2018-01-30 DIAGNOSIS — D4959 Neoplasm of unspecified behavior of other genitourinary organ: Secondary | ICD-10-CM | POA: Diagnosis not present

## 2018-01-30 DIAGNOSIS — K828 Other specified diseases of gallbladder: Secondary | ICD-10-CM | POA: Diagnosis not present

## 2018-01-30 DIAGNOSIS — D509 Iron deficiency anemia, unspecified: Secondary | ICD-10-CM | POA: Diagnosis not present

## 2018-01-31 ENCOUNTER — Other Ambulatory Visit (HOSPITAL_COMMUNITY): Payer: Self-pay | Admitting: Family Medicine

## 2018-01-31 DIAGNOSIS — K828 Other specified diseases of gallbladder: Secondary | ICD-10-CM

## 2018-02-03 ENCOUNTER — Other Ambulatory Visit: Payer: Self-pay

## 2018-02-03 ENCOUNTER — Encounter (HOSPITAL_COMMUNITY): Payer: Self-pay | Admitting: *Deleted

## 2018-02-03 DIAGNOSIS — K828 Other specified diseases of gallbladder: Secondary | ICD-10-CM | POA: Diagnosis present

## 2018-02-03 DIAGNOSIS — Z79899 Other long term (current) drug therapy: Secondary | ICD-10-CM

## 2018-02-03 DIAGNOSIS — I6523 Occlusion and stenosis of bilateral carotid arteries: Secondary | ICD-10-CM | POA: Diagnosis present

## 2018-02-03 DIAGNOSIS — Z96659 Presence of unspecified artificial knee joint: Secondary | ICD-10-CM | POA: Diagnosis present

## 2018-02-03 DIAGNOSIS — Z86011 Personal history of benign neoplasm of the brain: Secondary | ICD-10-CM

## 2018-02-03 DIAGNOSIS — E860 Dehydration: Secondary | ICD-10-CM | POA: Diagnosis present

## 2018-02-03 DIAGNOSIS — K219 Gastro-esophageal reflux disease without esophagitis: Secondary | ICD-10-CM | POA: Diagnosis present

## 2018-02-03 DIAGNOSIS — K2900 Acute gastritis without bleeding: Secondary | ICD-10-CM | POA: Diagnosis present

## 2018-02-03 DIAGNOSIS — J9601 Acute respiratory failure with hypoxia: Secondary | ICD-10-CM | POA: Diagnosis not present

## 2018-02-03 DIAGNOSIS — Z881 Allergy status to other antibiotic agents status: Secondary | ICD-10-CM

## 2018-02-03 DIAGNOSIS — J690 Pneumonitis due to inhalation of food and vomit: Secondary | ICD-10-CM | POA: Diagnosis not present

## 2018-02-03 DIAGNOSIS — N179 Acute kidney failure, unspecified: Secondary | ICD-10-CM | POA: Diagnosis not present

## 2018-02-03 DIAGNOSIS — Z833 Family history of diabetes mellitus: Secondary | ICD-10-CM

## 2018-02-03 DIAGNOSIS — B951 Streptococcus, group B, as the cause of diseases classified elsewhere: Secondary | ICD-10-CM | POA: Diagnosis present

## 2018-02-03 DIAGNOSIS — K811 Chronic cholecystitis: Secondary | ICD-10-CM | POA: Diagnosis present

## 2018-02-03 DIAGNOSIS — K449 Diaphragmatic hernia without obstruction or gangrene: Secondary | ICD-10-CM | POA: Diagnosis present

## 2018-02-03 DIAGNOSIS — G92 Toxic encephalopathy: Secondary | ICD-10-CM | POA: Diagnosis not present

## 2018-02-03 DIAGNOSIS — Z885 Allergy status to narcotic agent status: Secondary | ICD-10-CM

## 2018-02-03 DIAGNOSIS — Z8249 Family history of ischemic heart disease and other diseases of the circulatory system: Secondary | ICD-10-CM

## 2018-02-03 DIAGNOSIS — E785 Hyperlipidemia, unspecified: Secondary | ICD-10-CM | POA: Diagnosis present

## 2018-02-03 DIAGNOSIS — E872 Acidosis: Secondary | ICD-10-CM | POA: Diagnosis present

## 2018-02-03 DIAGNOSIS — M353 Polymyalgia rheumatica: Secondary | ICD-10-CM | POA: Diagnosis present

## 2018-02-03 DIAGNOSIS — K900 Celiac disease: Secondary | ICD-10-CM | POA: Diagnosis present

## 2018-02-03 DIAGNOSIS — I5032 Chronic diastolic (congestive) heart failure: Secondary | ICD-10-CM | POA: Diagnosis present

## 2018-02-03 DIAGNOSIS — Z515 Encounter for palliative care: Secondary | ICD-10-CM | POA: Diagnosis present

## 2018-02-03 DIAGNOSIS — R112 Nausea with vomiting, unspecified: Secondary | ICD-10-CM | POA: Diagnosis not present

## 2018-02-03 DIAGNOSIS — E669 Obesity, unspecified: Secondary | ICD-10-CM | POA: Diagnosis present

## 2018-02-03 DIAGNOSIS — K660 Peritoneal adhesions (postprocedural) (postinfection): Secondary | ICD-10-CM | POA: Diagnosis present

## 2018-02-03 DIAGNOSIS — A419 Sepsis, unspecified organism: Principal | ICD-10-CM | POA: Diagnosis present

## 2018-02-03 DIAGNOSIS — K222 Esophageal obstruction: Secondary | ICD-10-CM | POA: Diagnosis present

## 2018-02-03 DIAGNOSIS — R109 Unspecified abdominal pain: Secondary | ICD-10-CM | POA: Diagnosis not present

## 2018-02-03 DIAGNOSIS — Z79890 Hormone replacement therapy (postmenopausal): Secondary | ICD-10-CM

## 2018-02-03 DIAGNOSIS — R6521 Severe sepsis with septic shock: Secondary | ICD-10-CM | POA: Diagnosis not present

## 2018-02-03 DIAGNOSIS — I130 Hypertensive heart and chronic kidney disease with heart failure and stage 1 through stage 4 chronic kidney disease, or unspecified chronic kidney disease: Secondary | ICD-10-CM | POA: Diagnosis present

## 2018-02-03 DIAGNOSIS — Z90710 Acquired absence of both cervix and uterus: Secondary | ICD-10-CM

## 2018-02-03 DIAGNOSIS — Z7952 Long term (current) use of systemic steroids: Secondary | ICD-10-CM

## 2018-02-03 DIAGNOSIS — Z66 Do not resuscitate: Secondary | ICD-10-CM | POA: Diagnosis present

## 2018-02-03 DIAGNOSIS — K5790 Diverticulosis of intestine, part unspecified, without perforation or abscess without bleeding: Secondary | ICD-10-CM | POA: Diagnosis present

## 2018-02-03 DIAGNOSIS — K648 Other hemorrhoids: Secondary | ICD-10-CM | POA: Diagnosis present

## 2018-02-03 DIAGNOSIS — R1011 Right upper quadrant pain: Secondary | ICD-10-CM | POA: Diagnosis not present

## 2018-02-03 DIAGNOSIS — I2510 Atherosclerotic heart disease of native coronary artery without angina pectoris: Secondary | ICD-10-CM | POA: Diagnosis present

## 2018-02-03 DIAGNOSIS — M4184 Other forms of scoliosis, thoracic region: Secondary | ICD-10-CM | POA: Diagnosis present

## 2018-02-03 DIAGNOSIS — E1122 Type 2 diabetes mellitus with diabetic chronic kidney disease: Secondary | ICD-10-CM | POA: Diagnosis present

## 2018-02-03 DIAGNOSIS — N136 Pyonephrosis: Secondary | ICD-10-CM | POA: Diagnosis present

## 2018-02-03 DIAGNOSIS — R197 Diarrhea, unspecified: Secondary | ICD-10-CM | POA: Diagnosis not present

## 2018-02-03 DIAGNOSIS — Z882 Allergy status to sulfonamides status: Secondary | ICD-10-CM

## 2018-02-03 DIAGNOSIS — Z6830 Body mass index (BMI) 30.0-30.9, adult: Secondary | ICD-10-CM

## 2018-02-03 DIAGNOSIS — Z9089 Acquired absence of other organs: Secondary | ICD-10-CM

## 2018-02-03 DIAGNOSIS — Z880 Allergy status to penicillin: Secondary | ICD-10-CM

## 2018-02-03 DIAGNOSIS — D649 Anemia, unspecified: Secondary | ICD-10-CM | POA: Diagnosis present

## 2018-02-03 DIAGNOSIS — Z951 Presence of aortocoronary bypass graft: Secondary | ICD-10-CM

## 2018-02-03 DIAGNOSIS — E039 Hypothyroidism, unspecified: Secondary | ICD-10-CM | POA: Diagnosis present

## 2018-02-03 DIAGNOSIS — N183 Chronic kidney disease, stage 3 (moderate): Secondary | ICD-10-CM | POA: Diagnosis present

## 2018-02-03 DIAGNOSIS — Z888 Allergy status to other drugs, medicaments and biological substances status: Secondary | ICD-10-CM

## 2018-02-03 DIAGNOSIS — L719 Rosacea, unspecified: Secondary | ICD-10-CM | POA: Diagnosis present

## 2018-02-03 LAB — COMPREHENSIVE METABOLIC PANEL
ALBUMIN: 3.3 g/dL — AB (ref 3.5–5.0)
ALT: 17 U/L (ref 0–44)
AST: 22 U/L (ref 15–41)
Alkaline Phosphatase: 89 U/L (ref 38–126)
Anion gap: 15 (ref 5–15)
BUN: 25 mg/dL — AB (ref 8–23)
CHLORIDE: 98 mmol/L (ref 98–111)
CO2: 26 mmol/L (ref 22–32)
CREATININE: 1.86 mg/dL — AB (ref 0.44–1.00)
Calcium: 8.9 mg/dL (ref 8.9–10.3)
GFR calc Af Amer: 27 mL/min — ABNORMAL LOW (ref >60)
GFR calc non Af Amer: 23 mL/min — ABNORMAL LOW (ref >60)
GLUCOSE: 155 mg/dL — AB (ref 70–99)
POTASSIUM: 3.8 mmol/L (ref 3.5–5.1)
Sodium: 139 mmol/L (ref 135–145)
Total Bilirubin: 0.8 mg/dL (ref 0.3–1.2)
Total Protein: 7.2 g/dL (ref 6.5–8.1)

## 2018-02-03 LAB — CBC
HEMATOCRIT: 35.6 % — AB (ref 36.0–46.0)
Hemoglobin: 11.2 g/dL — ABNORMAL LOW (ref 12.0–15.0)
MCH: 24.5 pg — AB (ref 26.0–34.0)
MCHC: 31.5 g/dL (ref 30.0–36.0)
MCV: 77.7 fL — ABNORMAL LOW (ref 78.0–100.0)
Platelets: 392 10*3/uL (ref 150–400)
RBC: 4.58 MIL/uL (ref 3.87–5.11)
RDW: 18.2 % — AB (ref 11.5–15.5)
WBC: 16.9 10*3/uL — AB (ref 4.0–10.5)

## 2018-02-03 LAB — LIPASE, BLOOD: LIPASE: 35 U/L (ref 11–51)

## 2018-02-03 NOTE — ED Triage Notes (Signed)
Pt reports n/v/d and right sided abdominal pain for about 2 months, has been evaluated for the same prior to today. No changes in her symptoms, only ongoing at this time. She says she is suppose to have some testing done in radiology in the morning for her gallbladder. She has been taking zofran and pain meds at home without relief.

## 2018-02-04 ENCOUNTER — Ambulatory Visit (HOSPITAL_COMMUNITY): Admission: RE | Admit: 2018-02-04 | Payer: Medicare Other | Source: Ambulatory Visit

## 2018-02-04 ENCOUNTER — Inpatient Hospital Stay (HOSPITAL_COMMUNITY)
Admission: EM | Admit: 2018-02-04 | Discharge: 2018-02-16 | DRG: 853 | Disposition: E | Payer: Medicare Other | Attending: Internal Medicine | Admitting: Internal Medicine

## 2018-02-04 ENCOUNTER — Encounter (HOSPITAL_COMMUNITY): Payer: Self-pay | Admitting: Internal Medicine

## 2018-02-04 ENCOUNTER — Observation Stay (HOSPITAL_COMMUNITY): Payer: Medicare Other

## 2018-02-04 DIAGNOSIS — N179 Acute kidney failure, unspecified: Secondary | ICD-10-CM | POA: Diagnosis not present

## 2018-02-04 DIAGNOSIS — N3001 Acute cystitis with hematuria: Secondary | ICD-10-CM | POA: Diagnosis not present

## 2018-02-04 DIAGNOSIS — R197 Diarrhea, unspecified: Secondary | ICD-10-CM

## 2018-02-04 DIAGNOSIS — J9601 Acute respiratory failure with hypoxia: Secondary | ICD-10-CM

## 2018-02-04 DIAGNOSIS — R1010 Upper abdominal pain, unspecified: Secondary | ICD-10-CM | POA: Diagnosis not present

## 2018-02-04 DIAGNOSIS — N390 Urinary tract infection, site not specified: Secondary | ICD-10-CM | POA: Diagnosis present

## 2018-02-04 DIAGNOSIS — R112 Nausea with vomiting, unspecified: Secondary | ICD-10-CM | POA: Diagnosis not present

## 2018-02-04 DIAGNOSIS — E118 Type 2 diabetes mellitus with unspecified complications: Secondary | ICD-10-CM | POA: Diagnosis present

## 2018-02-04 DIAGNOSIS — R1011 Right upper quadrant pain: Secondary | ICD-10-CM

## 2018-02-04 DIAGNOSIS — N1330 Unspecified hydronephrosis: Secondary | ICD-10-CM

## 2018-02-04 DIAGNOSIS — R109 Unspecified abdominal pain: Secondary | ICD-10-CM | POA: Diagnosis not present

## 2018-02-04 LAB — URINALYSIS, ROUTINE W REFLEX MICROSCOPIC
BILIRUBIN URINE: NEGATIVE
Bacteria, UA: NONE SEEN
GLUCOSE, UA: NEGATIVE mg/dL
KETONES UR: NEGATIVE mg/dL
NITRITE: NEGATIVE
PH: 6 (ref 5.0–8.0)
Protein, ur: 100 mg/dL — AB
RBC / HPF: >50 RBC/hpf — ABNORMAL HIGH (ref 0–5)
SPECIFIC GRAVITY, URINE: 1.014 (ref 1.005–1.030)

## 2018-02-04 MED ORDER — TRAMADOL HCL 50 MG PO TABS
50.0000 mg | ORAL_TABLET | ORAL | Status: DC | PRN
  Administered 2018-02-04 – 2018-02-07 (×7): 50 mg via ORAL
  Filled 2018-02-04 (×7): qty 1

## 2018-02-04 MED ORDER — CARVEDILOL 3.125 MG PO TABS
1.5625 mg | ORAL_TABLET | Freq: Two times a day (BID) | ORAL | Status: DC
  Administered 2018-02-04 – 2018-02-08 (×9): 1.5625 mg via ORAL
  Filled 2018-02-04 (×10): qty 1

## 2018-02-04 MED ORDER — LEVOTHYROXINE SODIUM 50 MCG PO TABS
50.0000 ug | ORAL_TABLET | Freq: Every day | ORAL | Status: DC
  Administered 2018-02-04 – 2018-02-08 (×4): 50 ug via ORAL
  Filled 2018-02-04 (×4): qty 1

## 2018-02-04 MED ORDER — ENOXAPARIN SODIUM 30 MG/0.3ML ~~LOC~~ SOLN
30.0000 mg | SUBCUTANEOUS | Status: DC
  Administered 2018-02-04 – 2018-02-05 (×2): 30 mg via SUBCUTANEOUS
  Filled 2018-02-04 (×2): qty 0.3

## 2018-02-04 MED ORDER — PREDNISONE 1 MG PO TABS: 3.0000 mg | ORAL_TABLET | Freq: Every day | ORAL | Status: DC

## 2018-02-04 MED ORDER — HYDROMORPHONE HCL 1 MG/ML IJ SOLN
0.5000 mg | Freq: Once | INTRAMUSCULAR | Status: AC
  Administered 2018-02-04: 0.5 mg via INTRAVENOUS
  Filled 2018-02-04: qty 1

## 2018-02-04 MED ORDER — AMLODIPINE BESYLATE 10 MG PO TABS
10.0000 mg | ORAL_TABLET | Freq: Every day | ORAL | Status: DC
  Administered 2018-02-04 – 2018-02-08 (×5): 10 mg via ORAL
  Filled 2018-02-04 (×5): qty 1

## 2018-02-04 MED ORDER — ACETAMINOPHEN 650 MG RE SUPP
650.0000 mg | Freq: Four times a day (QID) | RECTAL | Status: DC | PRN
  Administered 2018-02-09: 650 mg via RECTAL
  Filled 2018-02-04: qty 1

## 2018-02-04 MED ORDER — SODIUM CHLORIDE 0.9 % IV SOLN: INTRAVENOUS | Status: AC

## 2018-02-04 MED ORDER — VITAMIN D 1000 UNITS PO TABS
4000.0000 [IU] | ORAL_TABLET | Freq: Every day | ORAL | Status: DC
  Administered 2018-02-04 – 2018-02-07 (×4): 4000 [IU] via ORAL
  Filled 2018-02-04 (×4): qty 4

## 2018-02-04 MED ORDER — ACETAMINOPHEN 325 MG PO TABS
650.0000 mg | ORAL_TABLET | Freq: Four times a day (QID) | ORAL | Status: DC | PRN
  Administered 2018-02-06: 650 mg via ORAL
  Filled 2018-02-04: qty 2

## 2018-02-04 MED ORDER — SODIUM CHLORIDE 0.9 % IV SOLN
Freq: Once | INTRAVENOUS | Status: AC
  Administered 2018-02-04: 150 mL/h via INTRAVENOUS

## 2018-02-04 MED ORDER — PROMETHAZINE HCL 25 MG/ML IJ SOLN
6.2500 mg | Freq: Once | INTRAMUSCULAR | Status: AC
  Administered 2018-02-04: 6.25 mg via INTRAVENOUS
  Filled 2018-02-04: qty 1

## 2018-02-04 MED ORDER — POTASSIUM CHLORIDE CRYS ER 10 MEQ PO TBCR
10.0000 meq | EXTENDED_RELEASE_TABLET | Freq: Every day | ORAL | Status: DC
  Administered 2018-02-04 – 2018-02-08 (×4): 10 meq via ORAL
  Filled 2018-02-04 (×6): qty 1

## 2018-02-04 MED ORDER — VITAMIN B-6 100 MG PO TABS
100.0000 mg | ORAL_TABLET | ORAL | Status: DC
  Filled 2018-02-04: qty 1

## 2018-02-04 MED ORDER — ONDANSETRON HCL 4 MG/2ML IJ SOLN
4.0000 mg | Freq: Four times a day (QID) | INTRAMUSCULAR | Status: DC | PRN
  Administered 2018-02-06 – 2018-02-09 (×3): 4 mg via INTRAVENOUS
  Filled 2018-02-04 (×2): qty 2

## 2018-02-04 MED ORDER — SODIUM CHLORIDE 0.9 % IV BOLUS
500.0000 mL | Freq: Once | INTRAVENOUS | Status: AC
  Administered 2018-02-04: 500 mL via INTRAVENOUS

## 2018-02-04 MED ORDER — VITAMIN C 500 MG PO TABS
500.0000 mg | ORAL_TABLET | Freq: Every day | ORAL | Status: DC
  Administered 2018-02-04 – 2018-02-08 (×4): 500 mg via ORAL
  Filled 2018-02-04 (×4): qty 1

## 2018-02-04 MED ORDER — PANTOPRAZOLE SODIUM 40 MG PO TBEC
40.0000 mg | DELAYED_RELEASE_TABLET | Freq: Every day | ORAL | Status: DC
  Administered 2018-02-04 – 2018-02-08 (×4): 40 mg via ORAL
  Filled 2018-02-04 (×4): qty 1

## 2018-02-04 MED ORDER — COLESTIPOL HCL 1 G PO TABS
2.0000 g | ORAL_TABLET | Freq: Every day | ORAL | Status: DC
  Administered 2018-02-04 – 2018-02-08 (×4): 2 g via ORAL
  Filled 2018-02-04 (×5): qty 2

## 2018-02-04 MED ORDER — PREDNISONE 5 MG PO TABS: 5.0000 mg | ORAL_TABLET | Freq: Every day | ORAL | Status: DC

## 2018-02-04 MED ORDER — AZELASTINE HCL 0.1 % NA SOLN
1.0000 | Freq: Two times a day (BID) | NASAL | Status: DC
  Administered 2018-02-04 – 2018-02-08 (×8): 1 via NASAL
  Filled 2018-02-04: qty 30

## 2018-02-04 MED ORDER — ONDANSETRON HCL 4 MG PO TABS: 4.0000 mg | ORAL_TABLET | Freq: Three times a day (TID) | ORAL | Status: DC | PRN

## 2018-02-04 MED ORDER — PREDNISONE 5 MG PO TABS
8.0000 mg | ORAL_TABLET | Freq: Every day | ORAL | Status: DC
  Administered 2018-02-04 – 2018-02-05 (×2): 8 mg via ORAL
  Filled 2018-02-04 (×3): qty 3

## 2018-02-04 MED ORDER — POTASSIUM CHLORIDE ER 8 MEQ PO TBCR: 8.0000 meq | EXTENDED_RELEASE_TABLET | Freq: Every day | ORAL | Status: DC

## 2018-02-04 MED ORDER — ASPIRIN EC 81 MG PO TBEC
81.0000 mg | DELAYED_RELEASE_TABLET | Freq: Every day | ORAL | Status: DC
  Administered 2018-02-04 – 2018-02-07 (×4): 81 mg via ORAL
  Filled 2018-02-04 (×4): qty 1

## 2018-02-04 MED ORDER — CYANOCOBALAMIN 500 MCG PO TABS
500.0000 ug | ORAL_TABLET | ORAL | Status: DC
  Filled 2018-02-04: qty 1

## 2018-02-04 MED ORDER — METRONIDAZOLE 0.75 % EX LOTN: 1.0000 "application " | TOPICAL_LOTION | Freq: Every evening | CUTANEOUS | Status: DC | PRN

## 2018-02-04 MED ORDER — TECHNETIUM TC 99M MEBROFENIN IV KIT
5.1000 | PACK | Freq: Once | INTRAVENOUS | Status: AC
  Administered 2018-02-04: 5.1 via INTRAVENOUS

## 2018-02-04 MED ORDER — HYDROMORPHONE HCL 1 MG/ML IJ SOLN
0.5000 mg | INTRAMUSCULAR | Status: DC | PRN
  Administered 2018-02-05: 0.5 mg via INTRAVENOUS
  Filled 2018-02-04 (×3): qty 0.5

## 2018-02-04 MED ORDER — FERROUS SULFATE 325 (65 FE) MG PO TABS
325.0000 mg | ORAL_TABLET | Freq: Two times a day (BID) | ORAL | Status: DC
  Administered 2018-02-04 – 2018-02-08 (×8): 325 mg via ORAL
  Filled 2018-02-04 (×8): qty 1

## 2018-02-04 MED ORDER — LABETALOL HCL 5 MG/ML IV SOLN
10.0000 mg | INTRAVENOUS | Status: DC | PRN
  Filled 2018-02-04: qty 4

## 2018-02-04 MED ORDER — ADULT MULTIVITAMIN W/MINERALS CH
1.0000 | ORAL_TABLET | Freq: Every day | ORAL | Status: DC
  Administered 2018-02-04 – 2018-02-07 (×4): 1 via ORAL
  Filled 2018-02-04 (×4): qty 1

## 2018-02-04 MED ORDER — CLONIDINE HCL 0.1 MG PO TABS
0.1000 mg | ORAL_TABLET | Freq: Two times a day (BID) | ORAL | Status: DC
  Administered 2018-02-04 – 2018-02-08 (×9): 0.1 mg via ORAL
  Filled 2018-02-04 (×11): qty 1

## 2018-02-04 NOTE — ED Notes (Signed)
ED TO INPATIENT HANDOFF REPORT  Name/Age/Gender [name] 84 y.o. female  Code Status    Code Status Orders  (From admission, onward)         Start     Ordered   02/14/2018 0548  Full code  Continuous     02/11/2018 0548        Code Status History    Date Active Date Inactive Code Status Order ID Comments User Context   01/21/2018 1052 01/25/2018 1557 Full Code [id number]  Elodia Florence., MD Inpatient   12/13/2017 2004 12/15/2017 1427 Full Code [id number]  Franchot Gallo, MD Inpatient   08/05/2011 0500 08/06/2011 1750 DNR [id number]  Toy Baker, MD Inpatient      Home/SNF/Other Home  Chief Complaint Adominal pain  Level of Care/Admitting Diagnosis ED Disposition    ED Disposition Condition Comment   [facility]: [facility] [[id number]]  Level of Care: Telemetry [5]  Admit to tele based on following criteria: Monitor for Ischemic changes  Diagnosis: Nausea & vomiting [683419]  Admitting Physician: Jani Gravel [[id number]]  Attending Physician: Jani Gravel [[id number]]  PT Class (Do Not Modify): Observation [104]  PT Acc Code (Do Not Modify): Observation [10022]       Medical History Past Medical History:  Diagnosis Date  . Carotid artery occlusion    1-39% bilateral carotid stenosis by dopplers 05/2016  . Celiac sprue   . Coronary artery disease    s/p CABG with 50% stenosis at ostium of the SVG to dirst diagonal, patent LIMA to LAD and patent left circ with nonobstructive ASCAD of the RCA  . Diabetes mellitus    diet controlled  . Diverticulitis   . Gait disorder   . GERD (gastroesophageal reflux disease)   . Hyperchloremia   . Hyperlipidemia    statin intolerant  . Hypertension   . Meningioma ([place])   . Obesity   . Rosacea     Allergies Allergies  Allergen Reactions  . Accutane [Isotretinoin] Other (See Comments)    Affected liver  . Ciprofloxacin Rash and Other (See Comments)    Kidney failure  . Erythromycin Diarrhea  and Rash  . Gluten Diarrhea and Other (See Comments)    Celiac sprue  . Gluten Meal Diarrhea and Rash    Celiac sprue intolerable  . Isotretinoin Other (See Comments)    Affects liver  . Lipitor [Atorvastatin] Other (See Comments)    Affects legs  . Lisinopril Other (See Comments)    Pt doesn't remember reaction   . Lovastatin Other (See Comments)    Affects legs  . Penicillins Other (See Comments)    From childhood Has patient had a PCN reaction causing immediate rash, facial/tongue/throat swelling, SOB or lightheadedness with hypotension: Unknown Has patient had a PCN reaction causing severe rash involving mucus membranes or skin necrosis: Unknown Has patient had a PCN reaction that required hospitalization: Unknown Has patient had a PCN reaction occurring within the last 10 years: No If all of the above answers are "NO", then may proceed with Cephalosporin use.  Marland Kitchen Phenytoin Sodium Extended Other (See Comments)    Affects liver  . Pravastatin Other (See Comments)    Affected legs  . Crestor [Rosuvastatin]     Muscle aches even on 48m once weekly  . Gabapentin Other (See Comments)    Shaky, swollen legs  . Hydrocodone Other (See Comments)    Restless leg  . Metoprolol Itching and Swelling  .  Prolia [Denosumab] Other (See Comments)    Joints and muscle aches, loss of hair  . Boniva [Ibandronic Acid] Other (See Comments)    headaches  . Clarithromycin Rash  . Hydralazine Rash    Pt does not know what the reactions were.  Marland Kitchen Lescol [Fluvastatin Sodium] Other (See Comments)    Affects legs  . Sulfa Antibiotics Rash  . Sulfasalazine Rash  . Welchol [Colesevelam Hcl] Other (See Comments)    No energy  . Zetia [Ezetimibe] Other (See Comments)    myalgias  . Zithromax [Azithromycin] Rash    IV Location/Drains/Wounds Patient Lines/Drains/Airways Status   Active Line/Drains/Airways    Name:   Placement date:   Placement time:   Site:   Days:   Peripheral IV 02/15/2018  Left;Upper Arm   01/31/2018    0250    Arm   less than 1   Ureteral Drain/Stent Right ureter 6 Fr.   12/14/17    0915    Right ureter   52          Labs/Imaging Results for orders placed or performed during the hospital encounter of 01/26/2018 (from the past 48 hour(s))  Lipase, blood     Status: None   Collection Time: 02/03/18  9:39 PM  Result Value Ref Range   Lipase 35 11 - 51 U/L    Comment: Performed at [facility], [place] [address], [place], [place] [address]  Comprehensive metabolic panel     Status: Abnormal   Collection Time: 02/03/18  9:39 PM  Result Value Ref Range   Sodium 139 135 - 145 mmol/L   Potassium 3.8 3.5 - 5.1 mmol/L   Chloride 98 98 - 111 mmol/L   CO2 26 22 - 32 mmol/L   Glucose, Bld 155 (H) 70 - 99 mg/dL   BUN 25 (H) 8 - 23 mg/dL   Creatinine, Ser 1.86 (H) 0.44 - 1.00 mg/dL   Calcium 8.9 8.9 - 10.3 mg/dL   Total Protein 7.2 6.5 - 8.1 g/dL   Albumin 3.3 (L) 3.5 - 5.0 g/dL   AST 22 15 - 41 U/L   ALT 17 0 - 44 U/L   Alkaline Phosphatase 89 38 - 126 U/L   Total Bilirubin 0.8 0.3 - 1.2 mg/dL   GFR calc non Af Amer 23 (L) >60 mL/min   GFR calc Af Amer 27 (L) >60 mL/min    Comment: (NOTE) The eGFR has been calculated using the CKD EPI equation. This calculation has not been validated in all clinical situations. eGFR's persistently <60 mL/min signify possible Chronic Kidney Disease.    Anion gap 15 5 - 15    Comment: Performed at [facility], [place] [address] [place], [place] [address]  CBC     Status: Abnormal   Collection Time: 02/03/18  9:39 PM  Result Value Ref Range   WBC 16.9 (H) 4.0 - 10.5 K/uL   RBC 4.58 3.87 - 5.11 MIL/uL   Hemoglobin 11.2 (L) 12.0 - 15.0 g/dL   HCT 35.6 (L) 36.0 - 46.0 %   MCV 77.7 (L) 78.0 - 100.0 fL   MCH 24.5 (L) 26.0 - 34.0 pg   MCHC 31.5 30.0 - 36.0 g/dL   RDW 18.2 (H) 11.5 - 15.5 %   Platelets 392 150 - 400 K/uL    Comment: Performed at [facility], [place]  [address] [place], [place] [address]  Urinalysis, Routine w reflex microscopic  Status: Abnormal   Collection Time: 01/22/2018  1:20 AM  Result Value Ref Range   Color, Urine RED (A) YELLOW    Comment: BIOCHEMICALS MAY BE AFFECTED BY COLOR   APPearance CLEAR CLEAR   Specific Gravity, Urine 1.014 1.005 - 1.030   pH 6.0 5.0 - 8.0   Glucose, UA NEGATIVE NEGATIVE mg/dL   Hgb urine dipstick LARGE (A) NEGATIVE   Bilirubin Urine NEGATIVE NEGATIVE   Ketones, ur NEGATIVE NEGATIVE mg/dL   Protein, ur 100 (A) NEGATIVE mg/dL   Nitrite NEGATIVE NEGATIVE   Leukocytes, UA LARGE (A) NEGATIVE   RBC / HPF >50 (H) 0 - 5 RBC/hpf   WBC, UA 11-20 0 - 5 WBC/hpf   Bacteria, UA NONE SEEN NONE SEEN   Ca Oxalate Crys, UA PRESENT     Comment: Performed at [facility], [place] [address], [place],  [address]   No results found.  Pending Labs Unresulted Labs (From admission, onward)    Start     Ordered   02/11/18 0500  Creatinine, serum  (enoxaparin (LOVENOX)    CrCl < 30 ml/min)  Weekly,   R    Comments:  while on enoxaparin therapy.    02/15/2018 0548   02/05/18 0500  Comprehensive metabolic panel  Tomorrow morning,   R     01/20/2018 0548   02/05/18 0500  CBC  Tomorrow morning,   R     02/07/2018 0548   01/23/2018 0525  Gastrointestinal Panel by PCR , Stool  (Gastrointestinal Panel by PCR, Stool)  Once,   R     01/24/2018 0525   01/24/2018 0525  C difficile quick scan w PCR reflex  (C Difficile quick screen w PCR reflex panel)  Once, for 24 hours,   R     02/07/2018 0525   02/07/2018 0150  Urine culture  STAT,   STAT     01/19/2018 0149          Vitals/Pain Today's Vitals   01/28/2018 0500 02/13/2018 0530 02/03/2018 0543 01/19/2018 0635  BP: 126/62 135/66  (!) 134/59  Pulse: 76 77  69  Resp: _0 Temp:      TempSrc:      SpO2: (!) 84% (!) 83%  96%  Weight:      Height:      PainSc:   3      Isolation Precautions Enteric precautions (UV disinfection)  Medications Medications   ondansetron (ZOFRAN) injection 4 mg (has no administration in time range)  HYDROmorphone (DILAUDID) injection 0.5 mg (has no administration in time range)  enoxaparin (LOVENOX) injection 30 mg (has no administration in time range)  0.9 %  sodium chloride infusion (has no administration in time range)  acetaminophen (TYLENOL) tablet 650 mg (has no administration in time range)    Or  acetaminophen (TYLENOL) suppository 650 mg (has no administration in time range)  sodium chloride 0.9 % bolus 500 mL (0 mLs Intravenous Stopped 02/08/2018 0354)  promethazine (PHENERGAN) injection 6.25 mg (6.25 mg Intravenous Given 02/13/2018 0241)  0.9 %  sodium chloride infusion (150 mL/hr Intravenous New Bag/Given 02/11/2018 0252)  HYDROmorphone (DILAUDID) injection 0.5 mg (0.5 mg Intravenous Given 01/28/2018 0407)    Mobility walks with device

## 2018-02-04 NOTE — Progress Notes (Signed)
Patient arrived to unit at 07:00am from the ED. Vitals taken, telemetry applied, and patient reminded to remain NPO for Hida scan later this morning. Report given to oncoming RN, Caryl Pina.

## 2018-02-04 NOTE — ED Notes (Signed)
Patient put on 2L [place] due to O2 Sat -87%

## 2018-02-04 NOTE — ED Notes (Signed)
Requested urine from patient. 

## 2018-02-04 NOTE — Progress Notes (Signed)
Patient seen and evaluated, chart reviewed, please see EMR for updated orders. Please see full H&P dictated by admitting physician Dr Jani Gravel for same date of service.     84 y.o. female, w/ hypertension, CAD s/p CABG, polymyalgia rheumatica , celiac disease, recent right ureteral stent placement 11/2017, s/p EGD 01/24/2018=> widely patent and non-obstructing schatzki ring, acute gastritis, and small hiatal hernia, Colonoscopy=> diverticulosis and internal hemorrhoids admitted on 02/08/2018 with intractable emesis and diarrhea with abdominal pain  Discussed with on-call [facility] doctor Dr. Therisa Doyne....... official GI consult pending  HIDA scan noted, okay to give liquid diet for now, we need to obtain stool samples  HTN--resume home medication, may use PRN labetalol if BP remains elevated  Plan of care discussed with patient, RN Caryl Pina and patient's son at bedside   Patient seen and evaluated, chart reviewed, please see EMR for updated orders. Please see full H&P dictated by admitting physician Dr Jani Gravel for same date of service.

## 2018-02-04 NOTE — H&P (Signed)
TR[facility] H&P   Patient Demographics:    [name], is a 84 y.o. female  MRN: [id number]   DOB - 09-Oct-1928  Admit Date - 02/15/2018  Outpatient Primary MD for the patient is Hulan Fess, MD  Referring MD/NP/PA: Charlann Lange  Outpatient Specialists:  [facility] Oletta Lamas and [facility]) Prev Bertram Millard  Patient coming from: home  Chief Complaint  Patient presents with  . Abdominal Pain      HPI:    [name]  is a 84 y.o. female, w hypertension, CAD s/p CABG, polymyalgia rheumatica , celiac disease, recent right ureteral stent placement 11/2017, s/p EGD 01/24/2018=> widely patent and non-obstructing schatzki ring, acute gastritis, and small hiatal hernia, Colonoscopy=> diverticulosis and internal hemorrhoids.  Pt presents today due to n/v over the past 24 hours as well as diarrhea, 3 loose bm.  Pt also noted slight abdominal discomfort in the upper abdomen.  Denies fever, chills, constipation , brbpr, dysuria, hematuria.  Pt is scheduled to have HIDA today.   In ED,  Lipase 35,  Na 139, K 3.8, Bun 25, Creatinine 1.86 Ast 22, Alt 17 Alk phos 89  T. Bili 0.8 Wbc 16.9, Hgb 11.2, Plt 392 Urinalysis wbc 11-20, rbc >50 Leuk esterase positive  Pt will be admitted for n/v, abd pain, diarrhea, and ? UTI and ARF on CRF (baseline creatinine 1.4)     Review of systems:    In addition to the HPI above,  No Fever-chills, No Headache, No changes with Vision or hearing, No problems swallowing food or Liquids, No Chest pain, Cough or Shortness of Breath, No Blood in stool or Urine, No dysuria, No new skin rashes or bruises, No new joints pains-aches,  No new weakness, tingling, numbness in any extremity, No recent weight gain or loss, No polyuria, polydypsia or polyphagia, No significant Mental Stressors.  A full 10 point Review of Systems was done, except as stated above, all  other Review of Systems were negative.   With Past History of the following :    Past Medical History:  Diagnosis Date  . Carotid artery occlusion    1-39% bilateral carotid stenosis by dopplers 05/2016  . Celiac sprue   . Coronary artery disease    s/p CABG with 50% stenosis at ostium of the SVG to dirst diagonal, patent LIMA to LAD and patent left circ with nonobstructive ASCAD of the RCA  . Diabetes mellitus    diet controlled  . Diverticulitis   . Gait disorder   . GERD (gastroesophageal reflux disease)   . Hyperchloremia   . Hyperlipidemia    statin intolerant  . Hypertension   . Meningioma ([place])   . Obesity   . Rosacea       Past Surgical History:  Procedure Laterality Date  . ABDOMINAL HYSTERECTOMY    . BRAIN TUMOR EXCISION  1993   meningioma, left  frontal  . BUNIONECTOMY    . CARDIAC CATHETERIZATION    . COLONOSCOPY WITH PROPOFOL N/A 01/24/2018   Procedure: COLONOSCOPY WITH PROPOFOL;  Surgeon: Wilford Corner, MD;  Location: [facility];  Service: Endoscopy;  Laterality: N/A;  . CORONARY ARTERY BYPASS GRAFT  2002    3 vessel  . CYSTOSCOPY WITH RETROGRADE PYELOGRAM, URETEROSCOPY AND STENT PLACEMENT Right 12/14/2017   Procedure: CYSTOSCOPY WITH RIGHT RETROGRADE PYELOGRAM, RIGHT URETEROSCOPY AND RIGHT URETERAL STENT PLACEMENT, WASHINGS;  Surgeon: Franchot Gallo, MD;  Location: [facility] ORS;  Service: Urology;  Laterality: Right;  . ESOPHAGOGASTRODUODENOSCOPY (EGD) WITH PROPOFOL N/A 01/24/2018   Procedure: ESOPHAGOGASTRODUODENOSCOPY (EGD) WITH PROPOFOL;  Surgeon: Wilford Corner, MD;  Location: [facility];  Service: Endoscopy;  Laterality: N/A;  . JOINT REPLACEMENT  2009  . REPLACEMENT TOTAL KNEE    . ROTATOR CUFF REPAIR  2010   Dr. Gladstone Lighter  . TONSILLECTOMY        Social History:     Social History   Tobacco Use  . Smoking status: Never Smoker  . Smokeless tobacco: Never Used  Substance Use Topics  . Alcohol use: No     Lives - at home  Mobility -  walks with walker   Family History :     Family History  Problem Relation Age of Onset  . Diabetes Mother   . Heart disease Brother   . Diabetes Sister   . Diabetes Sister        Home Medications:   Prior to Admission medications   Medication Sig Start Date End Date Taking? Authorizing Provider  amLODipine (NORVASC) 10 MG tablet Take 1 tablet (10 mg total) by mouth daily. 08/20/17 08/15/18 Yes Turner, Eber Hong, MD  Ascorbic Acid (VITAMIN C PO) Take 1 tablet by mouth daily at 3 pm.   Yes [provider]  aspirin EC 81 MG tablet Take 81 mg by mouth daily at 3 pm.   Yes [provider]  azelastine (ASTELIN) 0.1 % nasal spray Place 1 spray into both nostrils 2 (two) times daily. Use in each nostril as directed   Yes [provider]  Biotin 5000 MCG TABS Take 5,000 mcg by mouth once a week. On Tuesday   Yes [provider]  Calcium Citrate (CITRACAL PO) Take 1 tablet by mouth daily at 3 pm.    Yes [provider]  carvedilol (COREG) 3.125 MG tablet Take 1.5625 mg by mouth 2 (two) times daily.  11/06/17  Yes [provider]  Cholecalciferol 4000 UNITS CAPS Take 4,000 Units by mouth daily at 3 pm.   Yes [provider]  cloNIDine (CATAPRES) 0.1 MG tablet Take 0.1 mg by mouth 2 (two) times daily.   Yes [provider]  ferrous sulfate 325 (65 FE) MG tablet Take 1 tablet (325 mg total) by mouth 2 (two) times daily with a meal. 01/25/18 02/24/18 Yes Alma Friendly, MD  furosemide (LASIX) 40 MG tablet Take 1 tablet (40 mg total) by mouth daily. Morning and mid evening Patient taking differently: Take 40 mg by mouth 2 (two) times daily. Morning and mid evening 01/07/17  Yes Turner, Traci R, MD  irbesartan (AVAPRO) 300 MG tablet Take 300 mg by mouth daily.   Yes [provider]  Levothyroxine Sodium 50 MCG CAPS Take 1 capsule by mouth daily.    Yes [provider]  METRONIDAZOLE, TOPICAL, 0.75 % LOTN Apply 1  application topically at bedtime as needed (rosacea).   Yes [provider]  Multiple Vitamin (MULITIVITAMIN WITH MINERALS) TABS Take 1 tablet by mouth daily at 3 pm.    Yes [provider]  ondansetron (ZOFRAN) 4 MG tablet Take 4 mg by mouth every 8 (eight) hours as needed for nausea. for nausea 12/22/17  Yes [provider]  pantoprazole (PROTONIX) 40 MG tablet Take 40 mg by mouth daily before breakfast.   Yes [provider]  potassium chloride (KLOR-CON) 8 MEQ tablet Take 8 mEq by mouth daily.  04/26/14  Yes [provider]  predniSONE (DELTASONE) 1 MG tablet Take 3 mg by mouth daily. Take with 5 mg tablet   Yes [provider]  predniSONE (DELTASONE) 5 MG tablet Take 5 mg by mouth daily. Take with 1 mg tablets   Yes [provider]  pyridOXINE (VITAMIN B-6) 100 MG tablet Take 100 mg by mouth once a week. On Thursday   Yes [provider]  traMADol (ULTRAM) 50 MG tablet Take 50 mg by mouth every 4 (four) hours as needed for moderate pain.  12/24/17  Yes [provider]  vitamin B-12 (CYANOCOBALAMIN) 500 MCG tablet Take 500 mcg by mouth once a week. On Thursdays   Yes [provider]     Allergies:     Allergies  Allergen Reactions  . Accutane [Isotretinoin] Other (See Comments)    Affected liver  . Ciprofloxacin Rash and Other (See Comments)    Kidney failure  . Erythromycin Diarrhea and Rash  . Gluten Diarrhea and Other (See Comments)    Celiac sprue  . Gluten Meal Diarrhea and Rash    Celiac sprue intolerable  . Isotretinoin Other (See Comments)    Affects liver  . Lipitor [Atorvastatin] Other (See Comments)    Affects legs  . Lisinopril Other (See Comments)    Pt doesn't remember reaction   . Lovastatin Other (See Comments)    Affects legs  . Penicillins Other (See Comments)    From childhood Has patient had a PCN reaction causing immediate rash, facial/tongue/throat swelling, SOB or  lightheadedness with hypotension: Unknown Has patient had a PCN reaction causing severe rash involving mucus membranes or skin necrosis: Unknown Has patient had a PCN reaction that required hospitalization: Unknown Has patient had a PCN reaction occurring within the last 10 years: No If all of the above answers are "NO", then may proceed with Cephalosporin use.  Marland Kitchen Phenytoin Sodium Extended Other (See Comments)    Affects liver  . Pravastatin Other (See Comments)    Affected legs  . Crestor [Rosuvastatin]     Muscle aches even on 49m once weekly  . Gabapentin Other (See Comments)    Shaky, swollen legs  . Hydrocodone Other (See Comments)    Restless leg  . Metoprolol Itching and Swelling  . Prolia [Denosumab] Other (See Comments)    Joints and muscle aches, loss of hair  . Boniva [Ibandronic Acid] Other (See Comments)    headaches  . Clarithromycin Rash  . Hydralazine Rash    Pt does not know what the reactions were.  .Marland KitchenLescol [Fluvastatin Sodium] Other (See Comments)    Affects legs  . Sulfa Antibiotics Rash  . Sulfasalazine Rash  . Welchol [Colesevelam Hcl] Other (See Comments)    No energy  . Zetia [Ezetimibe] Other (See Comments)    myalgias  . Zithromax [Azithromycin] Rash     Physical Exam:   Vitals  Blood pressure (!) 147/63, pulse 76, temperature 97.8 F (36.6 C), temperature source Oral, resp.  rate 19, height 5' 4" (1.626 m), weight 80.5 kg, SpO2 93 %.   1. General  lying in bed in NAD,    2. Normal affect and insight, Not Suicidal or Homicidal, Awake Alert, Oriented X 3.  3. No F.N deficits, ALL C.Nerves Intact, Strength 5/5 all 4 extremities, Sensation intact all 4 extremities, Plantars down going.  4. Ears and Eyes appear Normal, Conjunctivae clear, PERRLA. Moist Oral Mucosa.  5. Supple Neck, No JVD, No cervical lymphadenopathy appriciated, No Carotid Bruits.  6. Symmetrical Chest Hartt movement, Good air movement bilaterally, CTAB.  7. RRR, No Gallops,  Rubs or Murmurs, No Parasternal Heave.  8. Positive Bowel Sounds, Abdomen Soft, No tenderness, No organomegaly appriciated,No rebound -guarding or rigidity.  9.  No Cyanosis, Normal Skin Turgor, No Skin Rash or Bruise.  10. Good muscle tone,  joints appear normal , no effusions, Normal ROM.  11. No Palpable Lymph Nodes in Neck or Axillae  Negative murphy   Data Review:    CBC Recent Labs  Lab 02/03/18 2139  WBC 16.9*  HGB 11.2*  HCT 35.6*  PLT 392  MCV 77.7*  MCH 24.5*  MCHC 31.5  RDW 18.2*   ------------------------------------------------------------------------------------------------------------------  Chemistries  Recent Labs  Lab 02/03/18 2139  NA 139  K 3.8  CL 98  CO2 26  GLUCOSE 155*  BUN 25*  CREATININE 1.86*  CALCIUM 8.9  AST 22  ALT 17  ALKPHOS 89  BILITOT 0.8   ------------------------------------------------------------------------------------------------------------------ estimated creatinine clearance is 21.9 mL/min (A) (by C-G formula based on SCr of 1.86 mg/dL (H)). ------------------------------------------------------------------------------------------------------------------ No results for input(s): TSH, T4TOTAL, T3FREE, THYROIDAB in the last 72 hours.  Invalid input(s): FREET3  Coagulation profile No results for input(s): INR, PROTIME in the last 168 hours. ------------------------------------------------------------------------------------------------------------------- No results for input(s): DDIMER in the last 72 hours. -------------------------------------------------------------------------------------------------------------------  Cardiac Enzymes No results for input(s): CKMB, TROPONINI, MYOGLOBIN in the last 168 hours.  Invalid input(s): CK ------------------------------------------------------------------------------------------------------------------ No results found for:  BNP   ---------------------------------------------------------------------------------------------------------------  Urinalysis    Component Value Date/Time   COLORURINE RED (A) 01/17/2018 0120   APPEARANCEUR CLEAR 02/03/2018 0120   LABSPEC 1.014 01/16/2018 0120   PHURINE 6.0 01/16/2018 0120   GLUCOSEU NEGATIVE 02/14/2018 0120   HGBUR LARGE (A) 01/25/2018 0120   BILIRUBINUR NEGATIVE 01/18/2018 0120   KETONESUR NEGATIVE 02/03/2018 0120   PROTEINUR 100 (A) 02/08/2018 0120   UROBILINOGEN 0.2 07/08/2010 1238   NITRITE NEGATIVE 01/28/2018 0120   LEUKOCYTESUR LARGE (A) 02/02/2018 0120    ----------------------------------------------------------------------------------------------------------------   Imaging Results:    No results found.    Assessment & Plan:    Active Problems:   Type 2 diabetes mellitus with complication (HCC)   Abdominal pain   UTI (urinary tract infection)   Nausea & vomiting   Diarrhea    N/v NPO except for medications zofran 70m iv q6h prn  HIDA  Abdominal pain Cont PPI HIDA Please call [facility] this AM for consultation   Diarrhea GI pathogen panel C. Diff Monitor, if not improving consider cholestyramine  UTI Urine culture pending Rocephin 1gm iv qday  ARF on CRF Hold on Lasix Hold on Irbesartan  Hypertension/ CAD Cont Carvedilol Cont Clonidine Cont Amlodipine Cont aspirin  Gerd Cont PPI  Hypothyroidism Cont Levothyroxine 50 micrograms po qday  Dm2 fsbs ac and qhs, ISS  DVT Prophylaxis Lovenox - SCDs  AM Labs Ordered, also please review Full Orders  Family Communication: Admission, patients condition and plan of care including tests  being ordered have been discussed with the patient who indicate understanding and agree with the plan and Code Status.  Code Status  FULL CODE  Likely DC to  home  Condition GUARDED   Consults called:   None, please call [facility] this am   Admission status: observation  Time  spent in minutes : 60   Jani Gravel M.D on 01/17/2018 at 5:26 AM  Between 7am to 7pm - Pager - [phone]   After 7pm go to [email or web address] - password [facility]  [facility] - Office  [phone]

## 2018-02-04 NOTE — ED Provider Notes (Signed)
[facility] DEPT Provider Note   CSN: [id number] Arrival date & time: 02/03/18  2123     History   Chief Complaint Chief Complaint  Patient presents with  . Abdominal Pain    HPI [name] is a 84 y.o. female.  Patient is here with daughter for further management of abdominal pain, vomiting and diarrhea that have been continuous for the past 6 weeks. She reports a 14 pound weight loss. No fever at any time. 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 At that admission she underwent EGD/colonoscopy Michail Sermon) for evaluation of pain and anemia which was unremarkable and a repeat CT scan showing no acute abnormality. She reports having a gall bladder US outpatient that showed "gall bladder sludge" per daughter. She is scheduled for a HIDA scan this morning at 8:00 am. She returns tonight for uncontrolled symptoms of pain (taking Ultram without relief) and nausea (taking zofran but continues to have nausea with vomiting). Still no fever. No hematemesis. She is taking an iron supplement so stools have been dark. No BRB per rectum.    The history is provided by the patient and a relative. No language interpreter was used.  Abdominal Pain   Associated symptoms include diarrhea, nausea and vomiting. Pertinent negatives include fever and dysuria.    Past Medical History:  Diagnosis Date  . Carotid artery occlusion    1-39% bilateral carotid stenosis by dopplers 05/2016  . Celiac sprue   . Coronary artery disease    s/p CABG with 50% stenosis at ostium of the SVG to dirst diagonal, patent LIMA to LAD and patent left circ with nonobstructive ASCAD of  the RCA  . Diabetes mellitus    diet controlled  . Diverticulitis   . Gait disorder   . GERD (gastroesophageal reflux disease)   . Hyperchloremia   . Hyperlipidemia    statin intolerant  . Hypertension   . Meningioma ([place])   . Obesity   . Rosacea     Patient Active Problem List   Diagnosis Date Noted  . Anemia 01/24/2018  . Abdominal pain, generalized 01/24/2018  . AKI (acute kidney injury) ([place]) 01/22/2018  . Acute kidney injury ([place]) 01/21/2018  . Urinary tract infection without hematuria   . Hydronephrosis 12/13/2017  . Type 2 diabetes mellitus with complication ([place]) 49/67/5916  . Edema extremities 05/03/2014  . Bilateral carotid artery stenosis   . Right carotid bruit 05/07/2013  . Heart murmur 05/07/2013  . Coronary artery disease   . Hyperlipidemia   . Hypertension   . Pain in limb 01/23/2013  . Pneumonia 08/05/2011  . Diabetes mellitus ([place]) 08/05/2011  . Cerebrovascular disease, unspecified 10/10/2010  . Dizziness and giddiness 10/10/2010  . Abnormality of gait 10/10/2010    Past Surgical History:  Procedure Laterality Date  . ABDOMINAL HYSTERECTOMY    . BRAIN TUMOR EXCISION  1993   meningioma, left frontal  . BUNIONECTOMY    . CARDIAC CATHETERIZATION    . COLONOSCOPY WITH PROPOFOL N/A 01/24/2018   Procedure: COLONOSCOPY WITH PROPOFOL;  Surgeon: Wilford Corner, MD;  Location: [facility];  Service: Endoscopy;  Laterality: N/A;  . CORONARY ARTERY BYPASS GRAFT  2002  3 vessel  . CYSTOSCOPY WITH RETROGRADE PYELOGRAM, URETEROSCOPY AND STENT PLACEMENT Right 12/14/2017   Procedure: CYSTOSCOPY WITH RIGHT RETROGRADE PYELOGRAM, RIGHT URETEROSCOPY AND RIGHT URETERAL STENT PLACEMENT, WASHINGS;  Surgeon: Franchot Gallo, MD;  Location: [facility];  Service: Urology;  Laterality: Right;  . ESOPHAGOGASTRODUODENOSCOPY (EGD) WITH PROPOFOL N/A 01/24/2018   Procedure: ESOPHAGOGASTRODUODENOSCOPY (EGD) WITH PROPOFOL;  Surgeon: Wilford Corner, MD;  Location: [facility];   Service: Endoscopy;  Laterality: N/A;  . JOINT REPLACEMENT  2009  . REPLACEMENT TOTAL KNEE    . ROTATOR CUFF REPAIR  2010   Dr. Gladstone Lighter  . TONSILLECTOMY       OB History   None      Home Medications    Prior to Admission medications   Medication Sig Start Date End Date Taking? Authorizing Provider  amLODipine (NORVASC) 10 MG tablet Take 1 tablet (10 mg total) by mouth daily. 08/20/17 08/15/18 Yes Turner, Eber Hong, MD  Ascorbic Acid (VITAMIN C PO) Take 1 tablet by mouth daily at 3 pm.   Yes [provider]  aspirin EC 81 MG tablet Take 81 mg by mouth daily at 3 pm.   Yes [provider]  azelastine (ASTELIN) 0.1 % nasal spray Place 1 spray into both nostrils 2 (two) times daily. Use in each nostril as directed   Yes [provider]  Biotin 5000 MCG TABS Take 5,000 mcg by mouth once a week. On Tuesday   Yes [provider]  Calcium Citrate (CITRACAL PO) Take 1 tablet by mouth daily at 3 pm.    Yes [provider]  carvedilol (COREG) 3.125 MG tablet Take 1.5625 mg by mouth 2 (two) times daily.  11/06/17  Yes [provider]  Cholecalciferol 4000 UNITS CAPS Take 4,000 Units by mouth daily at 3 pm.   Yes [provider]  cloNIDine (CATAPRES) 0.1 MG tablet Take 0.1 mg by mouth 2 (two) times daily.   Yes [provider]  ferrous sulfate 325 (65 FE) MG tablet Take 1 tablet (325 mg total) by mouth 2 (two) times daily with a meal. 01/25/18 02/24/18 Yes Alma Friendly, MD  furosemide (LASIX) 40 MG tablet Take 1 tablet (40 mg total) by mouth daily. Morning and mid evening Patient taking differently: Take 40 mg by mouth 2 (two) times daily. Morning and mid evening 01/07/17  Yes Turner, Traci R, MD  irbesartan (AVAPRO) 300 MG tablet Take 300 mg by mouth daily.   Yes [provider]  Levothyroxine Sodium 50 MCG CAPS Take 1 capsule by mouth daily.    Yes [provider]  METRONIDAZOLE, TOPICAL, 0.75 % LOTN Apply 1  application topically at bedtime as needed (rosacea).   Yes [provider]  Multiple Vitamin (MULITIVITAMIN WITH MINERALS) TABS Take 1 tablet by mouth daily at 3 pm.    Yes [provider]  ondansetron (ZOFRAN) 4 MG tablet Take 4 mg by mouth every 8 (eight) hours as needed for nausea. for nausea 12/22/17  Yes [provider]  pantoprazole (PROTONIX) 40 MG tablet Take 40 mg by mouth daily before breakfast.   Yes [provider]  potassium chloride (KLOR-CON) 8 MEQ tablet Take 8 mEq by mouth daily.  04/26/14  Yes [provider]  predniSONE (DELTASONE) 1 MG tablet Take 3 mg by mouth daily. Take with 5 mg tablet   Yes [provider]  predniSONE (DELTASONE) 5 MG tablet Take 5 mg by mouth daily. Take with 1 mg tablets  Yes [provider]  pyridOXINE (VITAMIN B-6) 100 MG tablet Take 100 mg by mouth once a week. On Thursday   Yes [provider]  traMADol (ULTRAM) 50 MG tablet Take 50 mg by mouth every 4 (four) hours as needed for moderate pain.  12/24/17  Yes [provider]  vitamin B-12 (CYANOCOBALAMIN) 500 MCG tablet Take 500 mcg by mouth once a week. On Thursdays   Yes [provider]    Family History Family History  Problem Relation Age of Onset  . Diabetes Mother   . Heart disease Brother   . Diabetes Sister   . Diabetes Sister     Social History Social History   Tobacco Use  . Smoking status: Never Smoker  . Smokeless tobacco: Never Used  Substance Use Topics  . Alcohol use: No  . Drug use: No     Allergies   Accutane [isotretinoin]; Ciprofloxacin; Erythromycin; Gluten; Gluten meal; Isotretinoin; Lipitor [atorvastatin]; Lisinopril; Lovastatin; Penicillins; Phenytoin sodium extended; Pravastatin; Crestor [rosuvastatin]; Gabapentin; Hydrocodone; Metoprolol; Prolia [denosumab]; Boniva [ibandronic acid]; Clarithromycin; Hydralazine; Lescol [fluvastatin sodium]; Sulfa antibiotics; Sulfasalazine;  Welchol [colesevelam hcl]; Zetia [ezetimibe]; and Zithromax [azithromycin]   Review of Systems Review of Systems  Constitutional: Positive for unexpected weight change. Negative for chills and fever.  HENT: Negative.   Respiratory: Negative.  Negative for cough and shortness of breath.   Cardiovascular: Negative.   Gastrointestinal: Positive for abdominal pain, diarrhea, nausea and vomiting. Negative for blood in stool.  Genitourinary: Negative.  Negative for dysuria.  Musculoskeletal: Negative.   Skin: Negative.   Neurological: Negative.      Physical Exam Updated Vital Signs BP 137/70 (BP Location: Left Arm)   Pulse 79   Temp 98.2 F (36.8 C) (Oral)   Resp 17   Ht 5\' 4"  (1.626 m)   Wt 80.5 kg   SpO2 95%   BMI 30.46 kg/m   Physical Exam  Constitutional: She appears well-developed and well-nourished.  Patient appears uncomfortable but nontoxic.  HENT:  Head: Normocephalic.  Neck: Normal range of motion. Neck supple.  Cardiovascular: Normal rate and regular rhythm.  Pulmonary/Chest: Effort normal and breath sounds normal.  Abdominal: Soft. Bowel sounds are normal. There is tenderness (She is dffusely tender with increased tenderness to RUQ compared to remainder abdomen.) in the right upper quadrant. There is no rebound and no guarding.  Musculoskeletal: Normal range of motion.  Neurological: She is alert. No cranial nerve deficit.  Skin: Skin is warm and dry. No rash noted.  Psychiatric: She has a normal mood and affect.     ED Treatments / Results  Labs (all labs ordered are listed, but only abnormal results are displayed) Labs Reviewed  COMPREHENSIVE METABOLIC PANEL - Abnormal; Notable for the following components:      Result Value   Glucose, Bld 155 (*)    BUN 25 (*)    Creatinine, Ser 1.86 (*)    Albumin 3.3 (*)    GFR calc non Af Amer 23 (*)    GFR calc Af Amer 27 (*)    All other components within normal limits  CBC - Abnormal; Notable for the  following components:   WBC 16.9 (*)    Hemoglobin 11.2 (*)    HCT 35.6 (*)    MCV 77.7 (*)    MCH 24.5 (*)    RDW 18.2 (*)    All other components within normal limits  URINALYSIS, ROUTINE W REFLEX MICROSCOPIC - Abnormal; Notable for the following components:  Color, Urine RED (*)    Hgb urine dipstick LARGE (*)    Protein, ur 100 (*)    Leukocytes, UA LARGE (*)    RBC / HPF >50 (*)    All other components within normal limits  URINE CULTURE  LIPASE, BLOOD    EKG None  Radiology No results found.  Procedures Procedures (including critical care time)  Medications Ordered in ED Medications  HYDROmorphone (DILAUDID) injection 0.5 mg (has no administration in time range)  sodium chloride 0.9 % bolus 500 mL (0 mLs Intravenous Stopped 01/30/2018 0354)  promethazine (PHENERGAN) injection 6.25 mg (6.25 mg Intravenous Given 02/08/2018 0241)  0.9 %  sodium chloride infusion (150 mL/hr Intravenous New Bag/Given 01/16/2018 0252)     Initial Impression / Assessment and Plan / ED Course  I have reviewed the triage vital signs and the nursing notes.  Pertinent labs & imaging results that were available during my care of the patient were reviewed by me and considered in my medical decision making (see chart for details).     Patient presents with persistent abdominal pain, nausea, vomiting, diarrhea, weight loss x 6 weeks.   Chart reviewed. IV started. Labs show she has recurrent AKI with Cr 1.86. She has a leukocytosis, on chronic prednisone (polymyalgia rheumatica) but has not had significant elevation recently so this can be considered an acute change. No recurrent UTI. There is hematuria which can be expected given recent stent placement. She does not have urinary symptoms.   Nausea is improved with phenergan. She is requiring IV pain medications for comfort.   Feel the patient would benefit from re-admission to the hospital for rehydration, symptom control and to obtain the already  scheduled HIDA scan while inpatient where action can be taken of any positive results. Will discuss with [facility] for admission.  Patient accepted to [facility] service for observation admission.   Final Clinical Impressions(s) / ED Diagnoses   Final diagnoses:  None   1. Abdominal pain 2. N, V, D 3. AKI  ED Discharge Orders    None       Charlann Lange, PA-C 01/17/2018 0502    Merryl Hacker, MD 02/14/2018 0600

## 2018-02-05 DIAGNOSIS — K811 Chronic cholecystitis: Secondary | ICD-10-CM | POA: Diagnosis not present

## 2018-02-05 DIAGNOSIS — R6521 Severe sepsis with septic shock: Secondary | ICD-10-CM | POA: Diagnosis not present

## 2018-02-05 DIAGNOSIS — D649 Anemia, unspecified: Secondary | ICD-10-CM | POA: Diagnosis present

## 2018-02-05 DIAGNOSIS — K222 Esophageal obstruction: Secondary | ICD-10-CM | POA: Diagnosis present

## 2018-02-05 DIAGNOSIS — E1122 Type 2 diabetes mellitus with diabetic chronic kidney disease: Secondary | ICD-10-CM | POA: Diagnosis present

## 2018-02-05 DIAGNOSIS — K828 Other specified diseases of gallbladder: Secondary | ICD-10-CM | POA: Diagnosis not present

## 2018-02-05 DIAGNOSIS — E669 Obesity, unspecified: Secondary | ICD-10-CM | POA: Diagnosis present

## 2018-02-05 DIAGNOSIS — R1031 Right lower quadrant pain: Secondary | ICD-10-CM | POA: Diagnosis not present

## 2018-02-05 DIAGNOSIS — K219 Gastro-esophageal reflux disease without esophagitis: Secondary | ICD-10-CM | POA: Diagnosis present

## 2018-02-05 DIAGNOSIS — R109 Unspecified abdominal pain: Secondary | ICD-10-CM | POA: Diagnosis not present

## 2018-02-05 DIAGNOSIS — R1011 Right upper quadrant pain: Secondary | ICD-10-CM | POA: Diagnosis not present

## 2018-02-05 DIAGNOSIS — J9601 Acute respiratory failure with hypoxia: Secondary | ICD-10-CM | POA: Diagnosis not present

## 2018-02-05 DIAGNOSIS — A419 Sepsis, unspecified organism: Secondary | ICD-10-CM | POA: Diagnosis not present

## 2018-02-05 DIAGNOSIS — I130 Hypertensive heart and chronic kidney disease with heart failure and stage 1 through stage 4 chronic kidney disease, or unspecified chronic kidney disease: Secondary | ICD-10-CM | POA: Diagnosis not present

## 2018-02-05 DIAGNOSIS — E039 Hypothyroidism, unspecified: Secondary | ICD-10-CM | POA: Diagnosis present

## 2018-02-05 DIAGNOSIS — R197 Diarrhea, unspecified: Secondary | ICD-10-CM | POA: Diagnosis not present

## 2018-02-05 DIAGNOSIS — N179 Acute kidney failure, unspecified: Secondary | ICD-10-CM | POA: Diagnosis not present

## 2018-02-05 DIAGNOSIS — K449 Diaphragmatic hernia without obstruction or gangrene: Secondary | ICD-10-CM | POA: Diagnosis present

## 2018-02-05 DIAGNOSIS — R112 Nausea with vomiting, unspecified: Secondary | ICD-10-CM | POA: Diagnosis not present

## 2018-02-05 DIAGNOSIS — N136 Pyonephrosis: Secondary | ICD-10-CM | POA: Diagnosis not present

## 2018-02-05 DIAGNOSIS — K660 Peritoneal adhesions (postprocedural) (postinfection): Secondary | ICD-10-CM | POA: Diagnosis not present

## 2018-02-05 DIAGNOSIS — N182 Chronic kidney disease, stage 2 (mild): Secondary | ICD-10-CM | POA: Diagnosis not present

## 2018-02-05 DIAGNOSIS — I5032 Chronic diastolic (congestive) heart failure: Secondary | ICD-10-CM | POA: Diagnosis not present

## 2018-02-05 DIAGNOSIS — E872 Acidosis: Secondary | ICD-10-CM | POA: Diagnosis not present

## 2018-02-05 DIAGNOSIS — N281 Cyst of kidney, acquired: Secondary | ICD-10-CM | POA: Diagnosis not present

## 2018-02-05 DIAGNOSIS — Z79890 Hormone replacement therapy (postmenopausal): Secondary | ICD-10-CM | POA: Diagnosis not present

## 2018-02-05 DIAGNOSIS — G9340 Encephalopathy, unspecified: Secondary | ICD-10-CM | POA: Diagnosis not present

## 2018-02-05 DIAGNOSIS — K2900 Acute gastritis without bleeding: Secondary | ICD-10-CM | POA: Diagnosis present

## 2018-02-05 DIAGNOSIS — Z515 Encounter for palliative care: Secondary | ICD-10-CM | POA: Diagnosis not present

## 2018-02-05 DIAGNOSIS — G92 Toxic encephalopathy: Secondary | ICD-10-CM | POA: Diagnosis not present

## 2018-02-05 DIAGNOSIS — Z66 Do not resuscitate: Secondary | ICD-10-CM | POA: Diagnosis not present

## 2018-02-05 DIAGNOSIS — N1330 Unspecified hydronephrosis: Secondary | ICD-10-CM | POA: Diagnosis not present

## 2018-02-05 DIAGNOSIS — J690 Pneumonitis due to inhalation of food and vomit: Secondary | ICD-10-CM | POA: Diagnosis not present

## 2018-02-05 DIAGNOSIS — E785 Hyperlipidemia, unspecified: Secondary | ICD-10-CM | POA: Diagnosis present

## 2018-02-05 DIAGNOSIS — I2510 Atherosclerotic heart disease of native coronary artery without angina pectoris: Secondary | ICD-10-CM | POA: Diagnosis present

## 2018-02-05 DIAGNOSIS — B951 Streptococcus, group B, as the cause of diseases classified elsewhere: Secondary | ICD-10-CM | POA: Diagnosis present

## 2018-02-05 DIAGNOSIS — R1084 Generalized abdominal pain: Secondary | ICD-10-CM | POA: Diagnosis not present

## 2018-02-05 DIAGNOSIS — J9600 Acute respiratory failure, unspecified whether with hypoxia or hypercapnia: Secondary | ICD-10-CM | POA: Diagnosis not present

## 2018-02-05 DIAGNOSIS — I10 Essential (primary) hypertension: Secondary | ICD-10-CM | POA: Diagnosis not present

## 2018-02-05 LAB — CBC
HCT: 31.1 % — ABNORMAL LOW (ref 36.0–46.0)
Hemoglobin: 9.5 g/dL — ABNORMAL LOW (ref 12.0–15.0)
MCH: 24.1 pg — ABNORMAL LOW (ref 26.0–34.0)
MCHC: 30.5 g/dL (ref 30.0–36.0)
MCV: 78.9 fL (ref 78.0–100.0)
PLATELETS: 309 10*3/uL (ref 150–400)
RBC: 3.94 MIL/uL (ref 3.87–5.11)
RDW: 17.9 % — AB (ref 11.5–15.5)
WBC: 13 10*3/uL — AB (ref 4.0–10.5)

## 2018-02-05 LAB — GASTROINTESTINAL PANEL BY PCR, STOOL (REPLACES STOOL CULTURE)
ADENOVIRUS F40/41: NOT DETECTED
Astrovirus: NOT DETECTED
CAMPYLOBACTER SPECIES: NOT DETECTED
CRYPTOSPORIDIUM: NOT DETECTED
CYCLOSPORA CAYETANENSIS: NOT DETECTED
ENTEROPATHOGENIC E COLI (EPEC): NOT DETECTED
Entamoeba histolytica: NOT DETECTED
Enteroaggregative E coli (EAEC): NOT DETECTED
Enterotoxigenic E coli (ETEC): NOT DETECTED
Giardia lamblia: NOT DETECTED
Norovirus GI/GII: NOT DETECTED
Plesimonas shigelloides: NOT DETECTED
Rotavirus A: NOT DETECTED
SAPOVIRUS (I, II, IV, AND V): NOT DETECTED
Salmonella species: NOT DETECTED
Shiga like toxin producing E coli (STEC): NOT DETECTED
Shigella/Enteroinvasive E coli (EIEC): NOT DETECTED
VIBRIO SPECIES: NOT DETECTED
Vibrio cholerae: NOT DETECTED
YERSINIA ENTEROCOLITICA: NOT DETECTED

## 2018-02-05 LAB — COMPREHENSIVE METABOLIC PANEL
ALK PHOS: 67 U/L (ref 38–126)
ALT: 14 U/L (ref 0–44)
AST: 16 U/L (ref 15–41)
Albumin: 2.4 g/dL — ABNORMAL LOW (ref 3.5–5.0)
Anion gap: 8 (ref 5–15)
BILIRUBIN TOTAL: 0.8 mg/dL (ref 0.3–1.2)
BUN: 18 mg/dL (ref 8–23)
CALCIUM: 8.3 mg/dL — AB (ref 8.9–10.3)
CO2: 27 mmol/L (ref 22–32)
CREATININE: 1.45 mg/dL — AB (ref 0.44–1.00)
Chloride: 105 mmol/L (ref 98–111)
GFR calc Af Amer: 36 mL/min — ABNORMAL LOW (ref >60)
GFR, EST NON AFRICAN AMERICAN: 31 mL/min — AB (ref >60)
Glucose, Bld: 125 mg/dL — ABNORMAL HIGH (ref 70–99)
Potassium: 4.9 mmol/L (ref 3.5–5.1)
Sodium: 140 mmol/L (ref 135–145)
TOTAL PROTEIN: 5.5 g/dL — AB (ref 6.5–8.1)

## 2018-02-05 LAB — URINE CULTURE: Culture: >=100000 — AB

## 2018-02-05 MED ORDER — ENOXAPARIN SODIUM 30 MG/0.3ML ~~LOC~~ SOLN
30.0000 mg | SUBCUTANEOUS | Status: DC
  Administered 2018-02-07 – 2018-02-08 (×2): 30 mg via SUBCUTANEOUS
  Filled 2018-02-05 (×2): qty 0.3

## 2018-02-05 MED ORDER — CEPHALEXIN 500 MG PO CAPS
500.0000 mg | ORAL_CAPSULE | Freq: Two times a day (BID) | ORAL | Status: DC
  Administered 2018-02-05 – 2018-02-08 (×6): 500 mg via ORAL
  Filled 2018-02-05 (×6): qty 1

## 2018-02-05 MED ORDER — CEFAZOLIN SODIUM-DEXTROSE 2-4 GM/100ML-% IV SOLN
2.0000 g | INTRAVENOUS | Status: AC
  Administered 2018-02-06: 2 g via INTRAVENOUS
  Filled 2018-02-05: qty 100

## 2018-02-05 NOTE — Care Management Note (Signed)
Case Management Note  Patient Details  Name: [name] MRN: [id number] Date of Birth: [birth year]  Subjective/Objective: Pt admitted with abd pain & UTI                   Action/Plan: plan to discharge home with no needs. Pt is not home bound will continue to follow.   Expected Discharge Date:                  Expected Discharge Plan:  Home/Self Care  In-House Referral:     Discharge planning Services  CM Consult  Post Acute Care Choice:    Choice offered to:     DME Arranged:    DME Agency:     HH Arranged:    HH Agency:     Status of Service:  In process, will continue to follow  If discussed at Long Length of Stay Meetings, dates discussed:    Additional CommentsPurcell Mouton, RN 02/05/2018, 11:06 AM

## 2018-02-05 NOTE — Consult Note (Signed)
[facility] Consult Note  [name] 11/06/1928  [id number].    Requesting MD: Emokpae Chief Complaint/Reason for Consult: RUQ pain  HPI:  Patient is a 84 year old female who presented to [facility] ED with abdominal pain, nausea, diarrhea. Patient describes postprandial RUQ pain for the last 6-8 weeks that is burning. Nothing noted to make it specifically better or worse. Patient is unable to tell me whether pain begins immediately after eating vs 30+ min after eating. Reports associated nausea. Reports diarrhea after eating as well, no blood or tarry stools. Patient does report stools have been very dark. She reports cramping pain with diarrhea. Has not had a BM today. Patient reports some fear of eating because she knows it will cause her to be in pain. This is the 3rd time she has been to the hospital for this in the last 2 months. HIDA scan showed EF of 27% (normal is 33%). GI panel negative. Patient was found to have a UTI in the ED and has been started on PO abx for this.  PMH significant for CAD s/p CABG in 2002, polymyalgia rheumatica on prednisone, hx of meningioma, HTN, T2DM, celiac disease and recent R ureteral stent placement. Not on any blood thinning medications and no past abdominal surgeries. Patient reports partial vaginal hysterectomy.   ROS: Review of Systems  Constitutional: Negative for chills and fever.  Respiratory: Negative for shortness of breath.   Cardiovascular: Negative for chest pain, palpitations and leg swelling.  Gastrointestinal: Positive for abdominal pain, diarrhea and nausea. Negative for blood in stool, constipation, melena and vomiting.  Genitourinary: Negative for dysuria, frequency and urgency.  All other systems reviewed and are negative.   Family History  Problem Relation Age of Onset  . Diabetes Mother   . Heart disease Brother   . Diabetes Sister   . Diabetes Sister     Past Medical History:  Diagnosis Date  . Carotid artery  occlusion    1-39% bilateral carotid stenosis by dopplers 05/2016  . Celiac sprue   . Coronary artery disease    s/p CABG with 50% stenosis at ostium of the SVG to dirst diagonal, patent LIMA to LAD and patent left circ with nonobstructive ASCAD of the RCA  . Diabetes mellitus    diet controlled  . Diverticulitis   . Gait disorder   . GERD (gastroesophageal reflux disease)   . Hyperchloremia   . Hyperlipidemia    statin intolerant  . Hypertension   . Meningioma (HCC)   . Obesity   . Rosacea     Past Surgical History:  Procedure Laterality Date  . ABDOMINAL HYSTERECTOMY    . BRAIN TUMOR EXCISION  1993   meningioma, left frontal  . BUNIONECTOMY    . CARDIAC CATHETERIZATION    . COLONOSCOPY WITH PROPOFOL N/A 01/24/2018   Procedure: COLONOSCOPY WITH PROPOFOL;  Surgeon: Schooler, Vincent, MD;  Location: [facility];  Service: Endoscopy;  Laterality: N/A;  . CORONARY ARTERY BYPASS GRAFT  2002    3 vessel  . CYSTOSCOPY WITH RETROGRADE PYELOGRAM, URETEROSCOPY AND STENT PLACEMENT Right 12/14/2017   Procedure: CYSTOSCOPY WITH RIGHT RETROGRADE PYELOGRAM, RIGHT URETEROSCOPY AND RIGHT URETERAL STENT PLACEMENT, WASHINGS;  Surgeon: Dahlstedt, Stephen, MD;  Location: [facility];  Service: Urology;  Laterality: Right;  . ESOPHAGOGASTRODUODENOSCOPY (EGD) WITH PROPOFOL N/A 01/24/2018   Procedure: ESOPHAGOGASTRODUODENOSCOPY (EGD) WITH PROPOFOL;  Surgeon: Schooler, Vincent, MD;  Location: [facility];  Service: Endoscopy;  Laterality: N/A;  . JOINT REPLACEMENT  2009  .   REPLACEMENT TOTAL KNEE    . ROTATOR CUFF REPAIR  2010   Dr. Gioffre  . TONSILLECTOMY      Social History:  reports that she has never smoked. She has never used smokeless tobacco. She reports that she does not drink alcohol or use drugs.  Allergies:  Allergies  Allergen Reactions  . Accutane [Isotretinoin] Other (See Comments)    Affected liver  . Ciprofloxacin Rash and Other (See Comments)    Kidney failure  . Erythromycin  Diarrhea and Rash  . Gluten Diarrhea and Other (See Comments)    Celiac sprue  . Gluten Meal Diarrhea and Rash    Celiac sprue intolerable  . Isotretinoin Other (See Comments)    Affects liver  . Lipitor [Atorvastatin] Other (See Comments)    Affects legs  . Lisinopril Other (See Comments)    Pt doesn't remember reaction   . Lovastatin Other (See Comments)    Affects legs  . Penicillins Other (See Comments)    From childhood Has patient had a PCN reaction causing immediate rash, facial/tongue/throat swelling, SOB or lightheadedness with hypotension: Unknown Has patient had a PCN reaction causing severe rash involving mucus membranes or skin necrosis: Unknown Has patient had a PCN reaction that required hospitalization: Unknown Has patient had a PCN reaction occurring within the last 10 years: No If all of the above answers are "NO", then may proceed with Cephalosporin use.  . Phenytoin Sodium Extended Other (See Comments)    Affects liver  . Pravastatin Other (See Comments)    Affected legs  . Crestor [Rosuvastatin]     Muscle aches even on 5mg once weekly  . Gabapentin Other (See Comments)    Shaky, swollen legs  . Hydrocodone Other (See Comments)    Restless leg  . Metoprolol Itching and Swelling  . Prolia [Denosumab] Other (See Comments)    Joints and muscle aches, loss of hair  . Boniva [Ibandronic Acid] Other (See Comments)    headaches  . Clarithromycin Rash  . Hydralazine Rash    Pt does not know what the reactions were.  . Lescol [Fluvastatin Sodium] Other (See Comments)    Affects legs  . Sulfa Antibiotics Rash  . Sulfasalazine Rash  . Welchol [Colesevelam Hcl] Other (See Comments)    No energy  . Zetia [Ezetimibe] Other (See Comments)    myalgias  . Zithromax [Azithromycin] Rash    Medications Prior to Admission  Medication Sig Dispense Refill  . amLODipine (NORVASC) 10 MG tablet Take 1 tablet (10 mg total) by mouth daily. 90 tablet 3  . Ascorbic Acid  (VITAMIN C PO) Take 1 tablet by mouth daily at 3 pm.    . aspirin EC 81 MG tablet Take 81 mg by mouth daily at 3 pm.    . azelastine (ASTELIN) 0.1 % nasal spray Place 1 spray into both nostrils 2 (two) times daily. Use in each nostril as directed    . Biotin 5000 MCG TABS Take 5,000 mcg by mouth once a week. On Tuesday    . Calcium Citrate (CITRACAL PO) Take 1 tablet by mouth daily at 3 pm.     . carvedilol (COREG) 3.125 MG tablet Take 1.5625 mg by mouth 2 (two) times daily.     . Cholecalciferol 4000 UNITS CAPS Take 4,000 Units by mouth daily at 3 pm.    . cloNIDine (CATAPRES) 0.1 MG tablet Take 0.1 mg by mouth 2 (two) times daily.    . ferrous sulfate 325 (  65 FE) MG tablet Take 1 tablet (325 mg total) by mouth 2 (two) times daily with a meal. 60 tablet 0  . furosemide (LASIX) 40 MG tablet Take 1 tablet (40 mg total) by mouth daily. Morning and mid evening (Patient taking differently: Take 40 mg by mouth 2 (two) times daily. Morning and mid evening) 30 tablet 11  . irbesartan (AVAPRO) 300 MG tablet Take 300 mg by mouth daily.    . Levothyroxine Sodium 50 MCG CAPS Take 1 capsule by mouth daily.     . METRONIDAZOLE, TOPICAL, 0.75 % LOTN Apply 1 application topically at bedtime as needed (rosacea).    . Multiple Vitamin (MULITIVITAMIN WITH MINERALS) TABS Take 1 tablet by mouth daily at 3 pm.     . ondansetron (ZOFRAN) 4 MG tablet Take 4 mg by mouth every 8 (eight) hours as needed for nausea. for nausea  1  . pantoprazole (PROTONIX) 40 MG tablet Take 40 mg by mouth daily before breakfast.    . potassium chloride (KLOR-CON) 8 MEQ tablet Take 8 mEq by mouth daily.   11  . predniSONE (DELTASONE) 1 MG tablet Take 3 mg by mouth daily. Take with 5 mg tablet    . predniSONE (DELTASONE) 5 MG tablet Take 5 mg by mouth daily. Take with 1 mg tablets    . pyridOXINE (VITAMIN B-6) 100 MG tablet Take 100 mg by mouth once a week. On Thursday    . traMADol (ULTRAM) 50 MG tablet Take 50 mg by mouth every 4 (four)  hours as needed for moderate pain.   0  . vitamin B-12 (CYANOCOBALAMIN) 500 MCG tablet Take 500 mcg by mouth once a week. On Thursdays      Blood pressure (!) 179/82, pulse 82, temperature 97.9 F (36.6 C), temperature source Oral, resp. rate 20, height 5' 2.5" (1.588 m), weight 77.7 kg, SpO2 98 %. Physical Exam: Physical Exam  Constitutional: She is oriented to person, place, and time. She appears well-developed and well-nourished. She is cooperative.  Non-toxic appearance. No distress.  HENT:  Head: Normocephalic and atraumatic.  Right Ear: External ear normal.  Left Ear: External ear normal.  Nose: Nose normal.  Mouth/Throat: Oropharynx is clear and moist and mucous membranes are normal.  Eyes: Conjunctivae, EOM and lids are normal. No scleral icterus.  Neck: Normal range of motion and phonation normal. Neck supple.  Cardiovascular: Normal rate and regular rhythm.  Pulses:      Radial pulses are 2+ on the right side, and 2+ on the left side.       Dorsalis pedis pulses are 2+ on the right side, and 2+ on the left side.  No LE edema  Pulmonary/Chest: Effort normal and breath sounds normal.  Abdominal: Soft. Bowel sounds are normal. She exhibits no distension. There is tenderness in the right upper quadrant. There is no rigidity, no rebound, no guarding and negative Murphy's sign.  Musculoskeletal:  ROM grossly intact in bilateral upper and lower extremities.   Neurological: She is alert and oriented to person, place, and time.  Skin: Skin is warm, dry and intact. She is not diaphoretic. No pallor.  Psychiatric: She has a normal mood and affect. Her speech is normal and behavior is normal.    Results for orders placed or performed during the hospital encounter of 01/17/2018 (from the past 48 hour(s))  Lipase, blood     Status: None   Collection Time: 02/03/18  9:39 PM  Result Value Ref Range     Lipase 35 11 - 51 U/L    Comment: Performed at [facility], [address] [place], [place] [address]  Comprehensive metabolic panel     Status: Abnormal   Collection Time: 02/03/18  9:39 PM  Result Value Ref Range   Sodium 139 135 - 145 mmol/L   Potassium 3.8 3.5 - 5.1 mmol/L   Chloride 98 98 - 111 mmol/L   CO2 26 22 - 32 mmol/L   Glucose, Bld 155 (H) 70 - 99 mg/dL   BUN 25 (H) 8 - 23 mg/dL   Creatinine, Ser 1.86 (H) 0.44 - 1.00 mg/dL   Calcium 8.9 8.9 - 10.3 mg/dL   Total Protein 7.2 6.5 - 8.1 g/dL   Albumin 3.3 (L) 3.5 - 5.0 g/dL   AST 22 15 - 41 U/L   ALT 17 0 - 44 U/L   Alkaline Phosphatase 89 38 - 126 U/L   Total Bilirubin 0.8 0.3 - 1.2 mg/dL   GFR calc non Af Amer 23 (L) >60 mL/min   GFR calc Af Amer 27 (L) >60 mL/min    Comment: (NOTE) The eGFR has been calculated using the CKD EPI equation. This calculation has not been validated in all clinical situations. eGFR's persistently <60 mL/min signify possible Chronic Kidney Disease.    Anion gap 15 5 - 15    Comment: Performed at [facility], [address] [place], [place] [address]  CBC     Status: Abnormal   Collection Time: 02/03/18  9:39 PM  Result Value Ref Range   WBC 16.9 (H) 4.0 - 10.5 K/uL   RBC 4.58 3.87 - 5.11 MIL/uL   Hemoglobin 11.2 (L) 12.0 - 15.0 g/dL   HCT 35.6 (L) 36.0 - 46.0 %   MCV 77.7 (L) 78.0 - 100.0 fL   MCH 24.5 (L) 26.0 - 34.0 pg   MCHC 31.5 30.0 - 36.0 g/dL   RDW 18.2 (H) 11.5 - 15.5 %   Platelets 392 150 - 400 K/uL    Comment: Performed at [facility], [address] [place], [place] [address]  Urinalysis, Routine w reflex microscopic     Status: Abnormal   Collection Time: 01/31/2018  1:20 AM  Result Value Ref Range   Color, Urine RED (A) YELLOW    Comment: BIOCHEMICALS MAY BE AFFECTED BY COLOR   APPearance CLEAR CLEAR   Specific Gravity, Urine 1.014 1.005 - 1.030   pH 6.0 5.0 - 8.0   Glucose, UA NEGATIVE NEGATIVE mg/dL   Hgb urine dipstick LARGE (A) NEGATIVE   Bilirubin Urine NEGATIVE NEGATIVE   Ketones, ur  NEGATIVE NEGATIVE mg/dL   Protein, ur 100 (A) NEGATIVE mg/dL   Nitrite NEGATIVE NEGATIVE   Leukocytes, UA LARGE (A) NEGATIVE   RBC / HPF >50 (H) 0 - 5 RBC/hpf   WBC, UA 11-20 0 - 5 WBC/hpf   Bacteria, UA NONE SEEN NONE SEEN   Ca Oxalate Crys, UA PRESENT     Comment: Performed at [facility], [address] [place], [place] [address]  Urine culture     Status: Abnormal   Collection Time: 02/03/2018  1:20 AM  Result Value Ref Range   Specimen Description      URINE, RANDOM Performed at [facility], [address] [place], [place] [address]    Special Requests      NONE [facility] at  [facility], [address] ,  [address]      Culture (A)     >=100,000 COLONIES/mL GROUP B STREP(S.AGALACTIAE)ISOLATED TESTING AGAINST S. AGALACTIAE NOT ROUTINELY PERFORMED DUE TO PREDICTABILITY OF AMP/PEN/VAN SUSCEPTIBILITY. Performed at [facility], [address] [place], [place] [address]    Report Status 02/05/2018 FINAL   Gastrointestinal Panel by PCR , Stool     Status: None   Collection Time: 02/14/2018  8:00 PM  Result Value Ref Range   Campylobacter species NOT DETECTED NOT DETECTED   Plesimonas shigelloides NOT DETECTED NOT DETECTED   Salmonella species NOT DETECTED NOT DETECTED   Yersinia enterocolitica NOT DETECTED NOT DETECTED   Vibrio species NOT DETECTED NOT DETECTED   Vibrio cholerae NOT DETECTED NOT DETECTED   Enteroaggregative E coli (EAEC) NOT DETECTED NOT DETECTED   Enteropathogenic E coli (EPEC) NOT DETECTED NOT DETECTED   Enterotoxigenic E coli (ETEC) NOT DETECTED NOT DETECTED   Shiga like toxin producing E coli (STEC) NOT DETECTED NOT DETECTED   Shigella/Enteroinvasive E coli (EIEC) NOT DETECTED NOT DETECTED   Cryptosporidium NOT DETECTED NOT DETECTED   Cyclospora cayetanensis NOT DETECTED NOT DETECTED   Entamoeba histolytica NOT DETECTED NOT DETECTED   Giardia lamblia NOT DETECTED NOT DETECTED    Adenovirus F40/41 NOT DETECTED NOT DETECTED   Astrovirus NOT DETECTED NOT DETECTED   Norovirus GI/GII NOT DETECTED NOT DETECTED   Rotavirus A NOT DETECTED NOT DETECTED   Sapovirus (I, II, IV, and V) NOT DETECTED NOT DETECTED    Comment: Performed at [facility], [address] [place], [place] [address]  Comprehensive metabolic panel     Status: Abnormal   Collection Time: 02/05/18  4:23 AM  Result Value Ref Range   Sodium 140 135 - 145 mmol/L   Potassium 4.9 3.5 - 5.1 mmol/L    Comment: DELTA CHECK NOTED NO VISIBLE HEMOLYSIS    Chloride 105 98 - 111 mmol/L   CO2 27 22 - 32 mmol/L   Glucose, Bld 125 (H) 70 - 99 mg/dL   BUN 18 8 - 23 mg/dL   Creatinine, Ser 1.45 (H) 0.44 - 1.00 mg/dL   Calcium 8.3 (L) 8.9 - 10.3 mg/dL   Total Protein 5.5 (L) 6.5 - 8.1 g/dL   Albumin 2.4 (L) 3.5 - 5.0 g/dL   AST 16 15 - 41 U/L   ALT 14 0 - 44 U/L   Alkaline Phosphatase 67 38 - 126 U/L   Total Bilirubin 0.8 0.3 - 1.2 mg/dL   GFR calc non Af Amer 31 (L) >60 mL/min   GFR calc Af Amer 36 (L) >60 mL/min    Comment: (NOTE) The eGFR has been calculated using the CKD EPI equation. This calculation has not been validated in all clinical situations. eGFR's persistently <60 mL/min signify possible Chronic Kidney Disease.    Anion gap 8 5 - 15    Comment: Performed at [facility], [address] [place],  [address]  CBC     Status: Abnormal   Collection Time: 02/05/18  4:23 AM  Result Value Ref Range   WBC 13.0 (H) 4.0 - 10.5 K/uL   RBC 3.94 3.87 - 5.11 MIL/uL   Hemoglobin 9.5 (L) 12.0 - 15.0 g/dL   HCT 31.1 (L) 36.0 - 46.0 %   MCV 78.9 78.0 - 100.0 fL   MCH 24.1 (L) 26.0 - 34.0 pg   MCHC 30.5 30.0 - 36.0 g/dL   RDW 17.9 (H) 11.5 - 15.5 %   Platelets 309 150 - 400 K/uL      Comment: Performed at [facility], [address] [place], [place] [address]   Nm Hepato W/eject Fract  Result Date: 01/27/2018 CLINICAL DATA:  Upper abdominal  pain with nausea and vomiting EXAM: NUCLEAR MEDICINE HEPATOBILIARY IMAGING WITH GALLBLADDER EF TECHNIQUE: Sequential images of the abdomen were obtained out to 60 minutes following intravenous administration of radiopharmaceutical. After oral ingestion of Ensure, gallbladder ejection fraction was determined. At 60 min, normal ejection fraction is greater than 33%. RADIOPHARMACEUTICALS:  5.1 mCi Tc-99m  Choletec IV COMPARISON:  None. FINDINGS: Prompt uptake and biliary excretion of activity by the liver is seen. Gallbladder activity is visualized, consistent with patency of cystic duct. Biliary activity passes into small bowel, consistent with patent common bile duct. Calculated gallbladder ejection fraction is 27%. (Normal gallbladder ejection fraction with Ensure is greater than 33%.) IMPRESSION: Normal uptake and excretion of biliary tracer. Decreased gallbladder ejection fraction of 27%. Electronically Signed   By: Mark  Lukens M.D.   On: 01/19/2018 13:55      Assessment/Plan HTN CAD s/p CABGx3 2002 T2DM - diet controlled, although A1c 5.9 Hx of meningioma s/p resection 1993 Obesity - BMI 30.8 GERD Hx of diverticulitis Celiac disease Group B strep UTI - per primary team  Polymyalgia rheumatica - on prednisone currently, tapering down  RUQ pain - HIDA with 27% EF, not suggestive of cholecystitis - last CT 8/6 without gallstones, gallbladder Munsch thickening, or biliary dilatation; CT 7/10 without any hepatobiliary abnormality as well - EGD 8/9 showed some gastritis - discussed with pt that removing gallbladder may or may not alleviate symptoms - plan for laparoscopic cholecystectomy potentially tomorrow pending cardiac clearance  Diarrhea - GI panel negative - colonoscopy 8/9 showed diverticulosis and internal hemorrhoids - GI following   FEN: FLD VTE: SCDs, lovenox ID: PO keflex 8/21>>  Ji Feldner Rayburn, PA-C [facility] 02/05/2018, 1:06 PM Pager: [phone] Consults:  [phone] Mon-Fri 7:00 am-4:30 pm Sat-Sun 7:00 am-11:30 am 

## 2018-02-05 NOTE — H&P (View-Only) (Signed)
[facility] Consult Note  [name] September 14, 1928  [id number].    Requesting MD: Denton Brick Chief Complaint/Reason for Consult: RUQ pain  HPI:  Patient is a 84 year old female who presented to [facility] ED with abdominal pain, nausea, diarrhea. Patient describes postprandial RUQ pain for the last 6-8 weeks that is burning. Nothing noted to make it specifically better or worse. Patient is unable to tell me whether pain begins immediately after eating vs 30+ min after eating. Reports associated nausea. Reports diarrhea after eating as well, no blood or tarry stools. Patient does report stools have been very dark. She reports cramping pain with diarrhea. Has not had a BM today. Patient reports some fear of eating because she knows it will cause her to be in pain. This is the 3rd time she has been to the hospital for this in the last 2 months. HIDA scan showed EF of 27% (normal is 33%). GI panel negative. Patient was found to have a UTI in the ED and has been started on PO abx for this.  PMH significant for CAD s/p CABG in 2002, polymyalgia rheumatica on prednisone, hx of meningioma, HTN, T2DM, celiac disease and recent R ureteral stent placement. Not on any blood thinning medications and no past abdominal surgeries. Patient reports partial vaginal hysterectomy.   ROS: Review of Systems  Constitutional: Negative for chills and fever.  Respiratory: Negative for shortness of breath.   Cardiovascular: Negative for chest pain, palpitations and leg swelling.  Gastrointestinal: Positive for abdominal pain, diarrhea and nausea. Negative for blood in stool, constipation, melena and vomiting.  Genitourinary: Negative for dysuria, frequency and urgency.  All other systems reviewed and are negative.   Family History  Problem Relation Age of Onset  . Diabetes Mother   . Heart disease Brother   . Diabetes Sister   . Diabetes Sister     Past Medical History:  Diagnosis Date  . Carotid artery  occlusion    1-39% bilateral carotid stenosis by dopplers 05/2016  . Celiac sprue   . Coronary artery disease    s/p CABG with 50% stenosis at ostium of the SVG to dirst diagonal, patent LIMA to LAD and patent left circ with nonobstructive ASCAD of the RCA  . Diabetes mellitus    diet controlled  . Diverticulitis   . Gait disorder   . GERD (gastroesophageal reflux disease)   . Hyperchloremia   . Hyperlipidemia    statin intolerant  . Hypertension   . Meningioma ([place])   . Obesity   . Rosacea     Past Surgical History:  Procedure Laterality Date  . ABDOMINAL HYSTERECTOMY    . BRAIN TUMOR EXCISION  1993   meningioma, left frontal  . BUNIONECTOMY    . CARDIAC CATHETERIZATION    . COLONOSCOPY WITH PROPOFOL N/A 01/24/2018   Procedure: COLONOSCOPY WITH PROPOFOL;  Surgeon: Wilford Corner, MD;  Location: [facility];  Service: Endoscopy;  Laterality: N/A;  . CORONARY ARTERY BYPASS GRAFT  2002    3 vessel  . CYSTOSCOPY WITH RETROGRADE PYELOGRAM, URETEROSCOPY AND STENT PLACEMENT Right 12/14/2017   Procedure: CYSTOSCOPY WITH RIGHT RETROGRADE PYELOGRAM, RIGHT URETEROSCOPY AND RIGHT URETERAL STENT PLACEMENT, WASHINGS;  Surgeon: Franchot Gallo, MD;  Location: [facility] OR[facility];  Service: Urology;  Laterality: Right;  . ESOPHAGOGASTRODUODENOSCOPY (EGD) WITH PROPOFOL N/A 01/24/2018   Procedure: ESOPHAGOGASTRODUODENOSCOPY (EGD) WITH PROPOFOL;  Surgeon: Wilford Corner, MD;  Location: [facility];  Service: Endoscopy;  Laterality: N/A;  . JOINT REPLACEMENT  2009  .  REPLACEMENT TOTAL KNEE    . ROTATOR CUFF REPAIR  2010   Dr. Gladstone Lighter  . TONSILLECTOMY      Social History:  reports that she has never smoked. She has never used smokeless tobacco. She reports that she does not drink alcohol or use drugs.  Allergies:  Allergies  Allergen Reactions  . Accutane [Isotretinoin] Other (See Comments)    Affected liver  . Ciprofloxacin Rash and Other (See Comments)    Kidney failure  . Erythromycin  Diarrhea and Rash  . Gluten Diarrhea and Other (See Comments)    Celiac sprue  . Gluten Meal Diarrhea and Rash    Celiac sprue intolerable  . Isotretinoin Other (See Comments)    Affects liver  . Lipitor [Atorvastatin] Other (See Comments)    Affects legs  . Lisinopril Other (See Comments)    Pt doesn't remember reaction   . Lovastatin Other (See Comments)    Affects legs  . Penicillins Other (See Comments)    From childhood Has patient had a PCN reaction causing immediate rash, facial/tongue/throat swelling, SOB or lightheadedness with hypotension: Unknown Has patient had a PCN reaction causing severe rash involving mucus membranes or skin necrosis: Unknown Has patient had a PCN reaction that required hospitalization: Unknown Has patient had a PCN reaction occurring within the last 10 years: No If all of the above answers are "NO", then may proceed with Cephalosporin use.  Marland Kitchen Phenytoin Sodium Extended Other (See Comments)    Affects liver  . Pravastatin Other (See Comments)    Affected legs  . Crestor [Rosuvastatin]     Muscle aches even on 66m once weekly  . Gabapentin Other (See Comments)    Shaky, swollen legs  . Hydrocodone Other (See Comments)    Restless leg  . Metoprolol Itching and Swelling  . Prolia [Denosumab] Other (See Comments)    Joints and muscle aches, loss of hair  . Boniva [Ibandronic Acid] Other (See Comments)    headaches  . Clarithromycin Rash  . Hydralazine Rash    Pt does not know what the reactions were.  .Marland KitchenLescol [Fluvastatin Sodium] Other (See Comments)    Affects legs  . Sulfa Antibiotics Rash  . Sulfasalazine Rash  . Welchol [Colesevelam Hcl] Other (See Comments)    No energy  . Zetia [Ezetimibe] Other (See Comments)    myalgias  . Zithromax [Azithromycin] Rash    Medications Prior to Admission  Medication Sig Dispense Refill  . amLODipine (NORVASC) 10 MG tablet Take 1 tablet (10 mg total) by mouth daily. 90 tablet 3  . Ascorbic Acid  (VITAMIN C PO) Take 1 tablet by mouth daily at 3 pm.    . aspirin EC 81 MG tablet Take 81 mg by mouth daily at 3 pm.    . azelastine (ASTELIN) 0.1 % nasal spray Place 1 spray into both nostrils 2 (two) times daily. Use in each nostril as directed    . Biotin 5000 MCG TABS Take 5,000 mcg by mouth once a week. On Tuesday    . Calcium Citrate (CITRACAL PO) Take 1 tablet by mouth daily at 3 pm.     . carvedilol (COREG) 3.125 MG tablet Take 1.5625 mg by mouth 2 (two) times daily.     . Cholecalciferol 4000 UNITS CAPS Take 4,000 Units by mouth daily at 3 pm.    . cloNIDine (CATAPRES) 0.1 MG tablet Take 0.1 mg by mouth 2 (two) times daily.    . ferrous sulfate 325 (  65 FE) MG tablet Take 1 tablet (325 mg total) by mouth 2 (two) times daily with a meal. 60 tablet 0  . furosemide (LASIX) 40 MG tablet Take 1 tablet (40 mg total) by mouth daily. Morning and mid evening (Patient taking differently: Take 40 mg by mouth 2 (two) times daily. Morning and mid evening) 30 tablet 11  . irbesartan (AVAPRO) 300 MG tablet Take 300 mg by mouth daily.    . Levothyroxine Sodium 50 MCG CAPS Take 1 capsule by mouth daily.     Marland Kitchen METRONIDAZOLE, TOPICAL, 0.75 % LOTN Apply 1 application topically at bedtime as needed (rosacea).    . Multiple Vitamin (MULITIVITAMIN WITH MINERALS) TABS Take 1 tablet by mouth daily at 3 pm.     . ondansetron (ZOFRAN) 4 MG tablet Take 4 mg by mouth every 8 (eight) hours as needed for nausea. for nausea  1  . pantoprazole (PROTONIX) 40 MG tablet Take 40 mg by mouth daily before breakfast.    . potassium chloride (KLOR-CON) 8 MEQ tablet Take 8 mEq by mouth daily.   11  . predniSONE (DELTASONE) 1 MG tablet Take 3 mg by mouth daily. Take with 5 mg tablet    . predniSONE (DELTASONE) 5 MG tablet Take 5 mg by mouth daily. Take with 1 mg tablets    . pyridOXINE (VITAMIN B-6) 100 MG tablet Take 100 mg by mouth once a week. On Thursday    . traMADol (ULTRAM) 50 MG tablet Take 50 mg by mouth every 4 (four)  hours as needed for moderate pain.   0  . vitamin B-12 (CYANOCOBALAMIN) 500 MCG tablet Take 500 mcg by mouth once a week. On Thursdays      Blood pressure (!) 179/82, pulse 82, temperature 97.9 F (36.6 C), temperature source Oral, resp. rate 20, height 5' 2.5" (1.588 m), weight 77.7 kg, SpO2 98 %. Physical Exam: Physical Exam  Constitutional: She is oriented to person, place, and time. She appears well-developed and well-nourished. She is cooperative.  Non-toxic appearance. No distress.  HENT:  Head: Normocephalic and atraumatic.  Right Ear: External ear normal.  Left Ear: External ear normal.  Nose: Nose normal.  Mouth/Throat: Oropharynx is clear and moist and mucous membranes are normal.  Eyes: Conjunctivae, EOM and lids are normal. No scleral icterus.  Neck: Normal range of motion and phonation normal. Neck supple.  Cardiovascular: Normal rate and regular rhythm.  Pulses:      Radial pulses are 2+ on the right side, and 2+ on the left side.       Dorsalis pedis pulses are 2+ on the right side, and 2+ on the left side.  No LE edema  Pulmonary/Chest: Effort normal and breath sounds normal.  Abdominal: Soft. Bowel sounds are normal. She exhibits no distension. There is tenderness in the right upper quadrant. There is no rigidity, no rebound, no guarding and negative Murphy's sign.  Musculoskeletal:  ROM grossly intact in bilateral upper and lower extremities.   Neurological: She is alert and oriented to person, place, and time.  Skin: Skin is warm, dry and intact. She is not diaphoretic. No pallor.  Psychiatric: She has a normal mood and affect. Her speech is normal and behavior is normal.    Results for orders placed or performed during the hospital encounter of 01/27/2018 (from the past 48 hour(s))  Lipase, blood     Status: None   Collection Time: 02/03/18  9:39 PM  Result Value Ref Range  Lipase 35 11 - 51 U/L    Comment: Performed at [facility], [place]  [address], [place], [place] [address]  Comprehensive metabolic panel     Status: Abnormal   Collection Time: 02/03/18  9:39 PM  Result Value Ref Range   Sodium 139 135 - 145 mmol/L   Potassium 3.8 3.5 - 5.1 mmol/L   Chloride 98 98 - 111 mmol/L   CO2 26 22 - 32 mmol/L   Glucose, Bld 155 (H) 70 - 99 mg/dL   BUN 25 (H) 8 - 23 mg/dL   Creatinine, Ser 1.86 (H) 0.44 - 1.00 mg/dL   Calcium 8.9 8.9 - 10.3 mg/dL   Total Protein 7.2 6.5 - 8.1 g/dL   Albumin 3.3 (L) 3.5 - 5.0 g/dL   AST 22 15 - 41 U/L   ALT 17 0 - 44 U/L   Alkaline Phosphatase 89 38 - 126 U/L   Total Bilirubin 0.8 0.3 - 1.2 mg/dL   GFR calc non Af Amer 23 (L) >60 mL/min   GFR calc Af Amer 27 (L) >60 mL/min    Comment: (NOTE) The eGFR has been calculated using the CKD EPI equation. This calculation has not been validated in all clinical situations. eGFR's persistently <60 mL/min signify possible Chronic Kidney Disease.    Anion gap 15 5 - 15    Comment: Performed at [facility], [place] [address] [place], [place] [address]  CBC     Status: Abnormal   Collection Time: 02/03/18  9:39 PM  Result Value Ref Range   WBC 16.9 (H) 4.0 - 10.5 K/uL   RBC 4.58 3.87 - 5.11 MIL/uL   Hemoglobin 11.2 (L) 12.0 - 15.0 g/dL   HCT 35.6 (L) 36.0 - 46.0 %   MCV 77.7 (L) 78.0 - 100.0 fL   MCH 24.5 (L) 26.0 - 34.0 pg   MCHC 31.5 30.0 - 36.0 g/dL   RDW 18.2 (H) 11.5 - 15.5 %   Platelets 392 150 - 400 K/uL    Comment: Performed at [facility], [place] [address] [place], [place] [address]  Urinalysis, Routine w reflex microscopic     Status: Abnormal   Collection Time: 01/19/2018  1:20 AM  Result Value Ref Range   Color, Urine RED (A) YELLOW    Comment: BIOCHEMICALS MAY BE AFFECTED BY COLOR   APPearance CLEAR CLEAR   Specific Gravity, Urine 1.014 1.005 - 1.030   pH 6.0 5.0 - 8.0   Glucose, UA NEGATIVE NEGATIVE mg/dL   Hgb urine dipstick LARGE (A) NEGATIVE   Bilirubin Urine NEGATIVE NEGATIVE   Ketones, ur  NEGATIVE NEGATIVE mg/dL   Protein, ur 100 (A) NEGATIVE mg/dL   Nitrite NEGATIVE NEGATIVE   Leukocytes, UA LARGE (A) NEGATIVE   RBC / HPF >50 (H) 0 - 5 RBC/hpf   WBC, UA 11-20 0 - 5 WBC/hpf   Bacteria, UA NONE SEEN NONE SEEN   Ca Oxalate Crys, UA PRESENT     Comment: Performed at [facility], [place] [address] [place], [place] [address]  Urine culture     Status: Abnormal   Collection Time: 02/01/2018  1:20 AM  Result Value Ref Range   Specimen Description      URINE, RANDOM Performed at [facility] [address], [place], [place] [address]    Special Requests      NONE Performed at [facility], [place] [address], [place], [place] [address]  Culture (A)     >=100,000 COLONIES/mL GROUP B STREP(S.AGALACTIAE)ISOLATED TESTING AGAINST S. AGALACTIAE NOT ROUTINELY PERFORMED DUE TO PREDICTABILITY OF AMP/PEN/VAN SUSCEPTIBILITY. Performed at [facility], [place] [address], [place], [place] [address]    Report Status 02/05/2018 FINAL   Gastrointestinal Panel by PCR , Stool     Status: None   Collection Time: 01/20/2018  8:00 PM  Result Value Ref Range   Campylobacter species NOT DETECTED NOT DETECTED   Plesimonas shigelloides NOT DETECTED NOT DETECTED   Salmonella species NOT DETECTED NOT DETECTED   Yersinia enterocolitica NOT DETECTED NOT DETECTED   Vibrio species NOT DETECTED NOT DETECTED   Vibrio cholerae NOT DETECTED NOT DETECTED   Enteroaggregative E coli (EAEC) NOT DETECTED NOT DETECTED   Enteropathogenic E coli (EPEC) NOT DETECTED NOT DETECTED   Enterotoxigenic E coli (ETEC) NOT DETECTED NOT DETECTED   Shiga like toxin producing E coli (STEC) NOT DETECTED NOT DETECTED   Shigella/Enteroinvasive E coli (EIEC) NOT DETECTED NOT DETECTED   Cryptosporidium NOT DETECTED NOT DETECTED   Cyclospora cayetanensis NOT DETECTED NOT DETECTED   Entamoeba histolytica NOT DETECTED NOT DETECTED   Giardia lamblia NOT DETECTED NOT DETECTED    Adenovirus F40/41 NOT DETECTED NOT DETECTED   Astrovirus NOT DETECTED NOT DETECTED   Norovirus GI/GII NOT DETECTED NOT DETECTED   Rotavirus A NOT DETECTED NOT DETECTED   Sapovirus (I, II, IV, and V) NOT DETECTED NOT DETECTED    Comment: Performed at [facility], [place] [place], [place] [address]  Comprehensive metabolic panel     Status: Abnormal   Collection Time: 02/05/18  4:23 AM  Result Value Ref Range   Sodium 140 135 - 145 mmol/L   Potassium 4.9 3.5 - 5.1 mmol/L    Comment: DELTA CHECK NOTED NO VISIBLE HEMOLYSIS    Chloride 105 98 - 111 mmol/L   CO2 27 22 - 32 mmol/L   Glucose, Bld 125 (H) 70 - 99 mg/dL   BUN 18 8 - 23 mg/dL   Creatinine, Ser 1.45 (H) 0.44 - 1.00 mg/dL   Calcium 8.3 (L) 8.9 - 10.3 mg/dL   Total Protein 5.5 (L) 6.5 - 8.1 g/dL   Albumin 2.4 (L) 3.5 - 5.0 g/dL   AST 16 15 - 41 U/L   ALT 14 0 - 44 U/L   Alkaline Phosphatase 67 38 - 126 U/L   Total Bilirubin 0.8 0.3 - 1.2 mg/dL   GFR calc non Af Amer 31 (L) >60 mL/min   GFR calc Af Amer 36 (L) >60 mL/min    Comment: (NOTE) The eGFR has been calculated using the CKD EPI equation. This calculation has not been validated in all clinical situations. eGFR's persistently <60 mL/min signify possible Chronic Kidney Disease.    Anion gap 8 5 - 15    Comment: Performed at [facility], [place] [address] [place],  [address]  CBC     Status: Abnormal   Collection Time: 02/05/18  4:23 AM  Result Value Ref Range   WBC 13.0 (H) 4.0 - 10.5 K/uL   RBC 3.94 3.87 - 5.11 MIL/uL   Hemoglobin 9.5 (L) 12.0 - 15.0 g/dL   HCT 31.1 (L) 36.0 - 46.0 %   MCV 78.9 78.0 - 100.0 fL   MCH 24.1 (L) 26.0 - 34.0 pg   MCHC 30.5 30.0 - 36.0 g/dL   RDW 17.9 (H) 11.5 - 15.5 %   Platelets 309 150 - 400 K/uL  Comment: Performed at [facility], [place] [address], [place], [place] [address]   Nm Hepato W/eject Fract  Result Date: 01/26/2018 CLINICAL DATA:  Upper abdominal  pain with nausea and vomiting EXAM: NUCLEAR MEDICINE HEPATOBILIARY IMAGING WITH GALLBLADDER EF TECHNIQUE: Sequential images of the abdomen were obtained out to 60 minutes following intravenous administration of radiopharmaceutical. After oral ingestion of Ensure, gallbladder ejection fraction was determined. At 60 min, normal ejection fraction is greater than 33%. RADIOPHARMACEUTICALS:  5.1 mCi Tc-38m Choletec IV COMPARISON:  None. FINDINGS: Prompt uptake and biliary excretion of activity by the liver is seen. Gallbladder activity is visualized, consistent with patency of cystic duct. Biliary activity passes into small bowel, consistent with patent common bile duct. Calculated gallbladder ejection fraction is 27%. (Normal gallbladder ejection fraction with Ensure is greater than 33%.) IMPRESSION: Normal uptake and excretion of biliary tracer. Decreased gallbladder ejection fraction of 27%. Electronically Signed   By: MInez CatalinaM.D.   On: 02/02/2018 13:55      Assessment/Plan HTN CAD s/p CABGx3 2002 T2DM - diet controlled, although A1c 5.9 Hx of meningioma s/p resection 1993 Obesity - BMI 30.8 GERD Hx of diverticulitis Celiac disease Group B strep UTI - per primary team  Polymyalgia rheumatica - on prednisone currently, tapering down  RUQ pain - HIDA with 27% EF, not suggestive of cholecystitis - last CT 8/6 without gallstones, gallbladder Morgenstern thickening, or biliary dilatation; CT 7/10 without any hepatobiliary abnormality as well - EGD 8/9 showed some gastritis - discussed with pt that removing gallbladder may or may not alleviate symptoms - plan for laparoscopic cholecystectomy potentially tomorrow pending cardiac clearance  Diarrhea - GI panel negative - colonoscopy 8/9 showed diverticulosis and internal hemorrhoids - GI following   FEN: FLD VTE: SCDs, lovenox ID: PO keflex 8/21>>  [facility] 02/05/2018, 1:06 PM Pager: [phone] Consults:  [phone]-Fri 7:00 am-4:30 pm Sat-Sun 7:00 am-11:30 am

## 2018-02-05 NOTE — Care Management Obs Status (Signed)
[place] NOTIFICATION   Patient Details  Name: [name] MRN: [id number] Date of Birth: May 04, 1929   Medicare Observation Status Notification Given:       Purcell Mouton, RN 02/05/2018, 11:13 AM

## 2018-02-05 NOTE — Consult Note (Signed)
Subjective:   HPI  The patient is an 84 year old female who we were asked to see in consultation be cut his of recurring right upper quadrant abdominal pain and 3 admissions to the hospital for the same over the past 2 months. The patient states that she gets right upper quadrant abdominal pain postprandially and this pain has gotten bad enough that she has had 3 admissions to the hospital. She had an EGD on August 9 showing some gastritis. She had a colonoscopy on August 9 showing diverticulosis and internal hemorrhoids. A HIDA scan done yesterday shows a biliary ejection fraction of 27%. There is a remote history of celiac disease based on an endoscopy from 2002 showing villous blunting. There is a history of a right ureteral stent without hydronephrosis.  Review of Systems No chest pain or shortness of breath  Past Medical History:  Diagnosis Date  . Carotid artery occlusion    1-39% bilateral carotid stenosis by dopplers 05/2016  . Celiac sprue   . Coronary artery disease    s/p CABG with 50% stenosis at ostium of the SVG to dirst diagonal, patent LIMA to LAD and patent left circ with nonobstructive ASCAD of the RCA  . Diabetes mellitus    diet controlled  . Diverticulitis   . Gait disorder   . GERD (gastroesophageal reflux disease)   . Hyperchloremia   . Hyperlipidemia    statin intolerant  . Hypertension   . Meningioma ([place])   . Obesity   . Rosacea    Past Surgical History:  Procedure Laterality Date  . ABDOMINAL HYSTERECTOMY    . BRAIN TUMOR EXCISION  1993   meningioma, left frontal  . BUNIONECTOMY    . CARDIAC CATHETERIZATION    . COLONOSCOPY WITH PROPOFOL N/A 01/24/2018   Procedure: COLONOSCOPY WITH PROPOFOL;  Surgeon: Wilford Corner, MD;  Location: [facility];  Service: Endoscopy;  Laterality: N/A;  . CORONARY ARTERY BYPASS GRAFT  2002    3 vessel  . CYSTOSCOPY WITH RETROGRADE PYELOGRAM, URETEROSCOPY AND STENT PLACEMENT Right 12/14/2017   Procedure: CYSTOSCOPY WITH  RIGHT RETROGRADE PYELOGRAM, RIGHT URETEROSCOPY AND RIGHT URETERAL STENT PLACEMENT, WASHINGS;  Surgeon: Franchot Gallo, MD;  Location: [facility];  Service: Urology;  Laterality: Right;  . ESOPHAGOGASTRODUODENOSCOPY (EGD) WITH PROPOFOL N/A 01/24/2018   Procedure: ESOPHAGOGASTRODUODENOSCOPY (EGD) WITH PROPOFOL;  Surgeon: Wilford Corner, MD;  Location: [facility];  Service: Endoscopy;  Laterality: N/A;  . JOINT REPLACEMENT  2009  . REPLACEMENT TOTAL KNEE    . ROTATOR CUFF REPAIR  2010   Dr. Gladstone Lighter  . TONSILLECTOMY     Social History   Socioeconomic History  . Marital status: Widowed    Spouse name: Not on file  . Number of children: 4  . Years of education: 75  . Highest education level: Not on file  Occupational History  . Occupation: Retired    Fish farm manager: RETIRED  Social Needs  . Financial resource strain: Not on file  . Food insecurity:    Worry: Not on file    Inability: Not on file  . Transportation needs:    Medical: Not on file    Non-medical: Not on file  Tobacco Use  . Smoking status: Never Smoker  . Smokeless tobacco: Never Used  Substance and Sexual Activity  . Alcohol use: No  . Drug use: No  . Sexual activity: Not on file  Lifestyle  . Physical activity:    Days per week: Not on file    Minutes per session:  Not on file  . Stress: Not on file  Relationships  . Social connections:    Talks on phone: Not on file    Gets together: Not on file    Attends religious service: Not on file    Active member of club or organization: Not on file    Attends meetings of clubs or organizations: Not on file    Relationship status: Not on file  . Intimate partner violence:    Fear of current or ex partner: Not on file    Emotionally abused: Not on file    Physically abused: Not on file    Forced sexual activity: Not on file  Other Topics Concern  . Not on file  Social History Narrative  . Not on file   family history includes Diabetes in her mother, sister, and  sister; Heart disease in her brother.  Current Facility-Administered Medications:  .  acetaminophen (TYLENOL) tablet 650 mg, 650 mg, Oral, Q6H PRN **OR** acetaminophen (TYLENOL) suppository 650 mg, 650 mg, Rectal, Q6H PRN, Jani Gravel, MD .  amLODipine (NORVASC) tablet 10 mg, 10 mg, Oral, Daily, Jani Gravel, MD, 10 mg at 01/26/2018 1324 .  aspirin EC tablet 81 mg, 81 mg, Oral, Q1500, Jani Gravel, MD, 81 mg at 01/19/2018 1359 .  azelastine (ASTELIN) 0.1 % nasal spray 1 spray, 1 spray, Each Nare, BID, Jani Gravel, MD, 1 spray at 01/18/2018 2332 .  carvedilol (COREG) tablet 1.5625 mg, 1.5625 mg, Oral, BID WC, Jani Gravel, MD, 1.5625 mg at 01/27/2018 1907 .  cephALEXin (KEFLEX) capsule 500 mg, 500 mg, Oral, Q12H, Emokpae, Courage, MD .  cholecalciferol (VITAMIN D) tablet 4,000 Units, 4,000 Units, Oral, Q1500, Jani Gravel, MD, 4,000 Units at 01/19/2018 1357 .  cloNIDine (CATAPRES) tablet 0.1 mg, 0.1 mg, Oral, BID, Jani Gravel, MD, 0.1 mg at 02/03/2018 2331 .  colestipol (COLESTID) tablet 2 g, 2 g, Oral, Daily, Emokpae, Courage, MD, 2 g at 02/08/2018 1906 .  enoxaparin (LOVENOX) injection 30 mg, 30 mg, Subcutaneous, Q24H, Jani Gravel, MD, 30 mg at 01/28/2018 1345 .  ferrous sulfate tablet 325 mg, 325 mg, Oral, BID PC, Jani Gravel, MD, 325 mg at 01/18/2018 1908 .  HYDROmorphone (DILAUDID) injection 0.5 mg, 0.5 mg, Intravenous, Q4H PRN, Jani Gravel, MD .  labetalol (NORMODYNE,TRANDATE) injection 10 mg, 10 mg, Intravenous, Q4H PRN, Emokpae, Courage, MD .  levothyroxine (SYNTHROID, LEVOTHROID) tablet 50 mcg, 50 mcg, Oral, QAC breakfast, Jani Gravel, MD, 50 mcg at 01/16/2018 1344 .  multivitamin with minerals tablet 1 tablet, 1 tablet, Oral, Q1500, Jani Gravel, MD, 1 tablet at 02/03/2018 1355 .  ondansetron (ZOFRAN) injection 4 mg, 4 mg, Intravenous, Q6H PRN, Jani Gravel, MD .  ondansetron [facility]) tablet 4 mg, 4 mg, Oral, Q8H PRN, Jani Gravel, MD .  pantoprazole (PROTONIX) EC tablet 40 mg, 40 mg, Oral, QAC breakfast, Jani Gravel, MD, 40 mg at  02/03/2018 1323 .  potassium chloride (K-DUR,KLOR-CON) CR tablet 10 mEq, 10 mEq, Oral, Daily, Jani Gravel, MD, 10 mEq at 02/08/2018 1312 .  predniSONE (DELTASONE) tablet 8 mg, 8 mg, Oral, Daily, Jani Gravel, MD, 8 mg at 02/03/2018 1906 .  [START ON 01/29/2018] pyridOXINE (VITAMIN B-6) tablet 100 mg, 100 mg, Oral, Weekly, Jani Gravel, MD .  traMADol Veatrice Bourbon) tablet 50 mg, 50 mg, Oral, Q4H PRN, Jani Gravel, MD, 50 mg at 02/05/18 1103 .  [START ON 02/08/2018] vitamin B-12 (CYANOCOBALAMIN) tablet 500 mcg, 500 mcg, Oral, Weekly, Jani Gravel, MD .  vitamin C (ASCORBIC ACID) tablet  500 mg, 500 mg, Oral, Daily, Jani Gravel, MD, 500 mg at 02/01/2018 1324 Allergies  Allergen Reactions  . Accutane [Isotretinoin] Other (See Comments)    Affected liver  . Ciprofloxacin Rash and Other (See Comments)    Kidney failure  . Erythromycin Diarrhea and Rash  . Gluten Diarrhea and Other (See Comments)    Celiac sprue  . Gluten Meal Diarrhea and Rash    Celiac sprue intolerable  . Isotretinoin Other (See Comments)    Affects liver  . Lipitor [Atorvastatin] Other (See Comments)    Affects legs  . Lisinopril Other (See Comments)    Pt doesn't remember reaction   . Lovastatin Other (See Comments)    Affects legs  . Penicillins Other (See Comments)    From childhood Has patient had a PCN reaction causing immediate rash, facial/tongue/throat swelling, SOB or lightheadedness with hypotension: Unknown Has patient had a PCN reaction causing severe rash involving mucus membranes or skin necrosis: Unknown Has patient had a PCN reaction that required hospitalization: Unknown Has patient had a PCN reaction occurring within the last 10 years: No If all of the above answers are "NO", then may proceed with Cephalosporin use.  Marland Kitchen Phenytoin Sodium Extended Other (See Comments)    Affects liver  . Pravastatin Other (See Comments)    Affected legs  . Crestor [Rosuvastatin]     Muscle aches even on 5mg  once weekly  . Gabapentin Other  (See Comments)    Shaky, swollen legs  . Hydrocodone Other (See Comments)    Restless leg  . Metoprolol Itching and Swelling  . Prolia [Denosumab] Other (See Comments)    Joints and muscle aches, loss of hair  . Boniva [Ibandronic Acid] Other (See Comments)    headaches  . Clarithromycin Rash  . Hydralazine Rash    Pt does not know what the reactions were.  Marland Kitchen Lescol [Fluvastatin Sodium] Other (See Comments)    Affects legs  . Sulfa Antibiotics Rash  . Sulfasalazine Rash  . Welchol [Colesevelam Hcl] Other (See Comments)    No energy  . Zetia [Ezetimibe] Other (See Comments)    myalgias  . Zithromax [Azithromycin] Rash     Objective:     BP (!) 179/82 (BP Location: Left Arm)   Pulse 82   Temp 97.9 F (36.6 C) (Oral)   Resp 20   Ht 5' 2.5" (1.588 m)   Wt 77.7 kg   SpO2 98%   BMI 30.83 kg/m   No distress  Alert and oriented  Nonicteric  Heart regular rhythm no murmurs  Lungs clear  Abdomen: Soft, there is some tenderness in the right upper quadrant  Laboratory No components found for: D1    Assessment:     Right upper quadrant pain with abnormal biliary ejection fraction of 27%. Patient has been admitted to the hospital 3 times according to her with this same problem in the last 2 months. Clinical suspicion is that of biliary dyskinesia.  Diarrhea of unclear etiology. Recent colonoscopy shows diverticulosis. Diarrhea could also be due to celiac disease        Plan:     Would recommend a surgical consultation for the suspected biliary dyskinesia. I see nothing else on her recent evaluation to explain the right upper quadrant pain other than biliary dyskinesia. Would obtain a TTG IgGA to check on celiac disease status

## 2018-02-05 NOTE — Anesthesia Preprocedure Evaluation (Addendum)
Anesthesia Evaluation  Patient identified by MRN, date of birth, ID band Patient awake    Reviewed: Allergy & Precautions, NPO status , Patient's Chart, lab work & pertinent test results  Airway Mallampati: II  TM Distance: <3 FB Neck ROM: Full    Dental no notable dental hx. (+) Teeth Intact, Dental Advisory Given   Pulmonary neg pulmonary ROS,    Pulmonary exam normal breath sounds clear to auscultation       Cardiovascular hypertension, Pt. on medications and Pt. on home beta blockers + CAD and + CABG  Normal cardiovascular exam Rhythm:Regular Rate:Normal  05/29/2013 ECHO Left ventricle: The cavity size was normal. There was mild focal basal hypertrophy of the septum. Systolic function was normal. The estimated ejection fraction was in the range of 60% to 65%. [place] motion was normal; there were no regional Gucciardo motion abnormalities. There was an increased relative contribution of atrial contraction to ventricular filling. Features are consistent with a pseudonormal left ventricular filling pattern, with concomitant abnormal relaxation and increased filling pressure (grade 2 diastolic dysfunction   Neuro/Psych negative neurological ROS  negative psych ROS   GI/Hepatic Neg liver ROS, GERD  ,  Endo/Other  diabetes  Renal/GU Renal disease     Musculoskeletal   Abdominal (+) + obese,   Peds  Hematology  (+) anemia ,   Anesthesia Other Findings   Reproductive/Obstetrics                            Lab Results  Component Value Date   WBC 13.0 (H) 02/05/2018   HGB 9.5 (L) 02/05/2018   HCT 31.1 (L) 02/05/2018   MCV 78.9 02/05/2018   PLT 309 02/05/2018    Anesthesia Physical Anesthesia Plan  ASA: III  Anesthesia Plan: General   Post-op Pain Management:    Induction: Intravenous  PONV Risk Score and Plan: 3 and Treatment may vary due to age or medical condition,  Dexamethasone and Ondansetron  Airway Management Planned: Oral ETT  Additional Equipment:   Intra-op Plan:   Post-operative Plan: Extubation in OR  Informed Consent: I have reviewed the patients History and Physical, chart, labs and discussed the procedure including the risks, benefits and alternatives for the proposed anesthesia with the patient or authorized representative who has indicated his/her understanding and acceptance.   Dental advisory given  Plan Discussed with: CRNA  Anesthesia Plan Comments:        Anesthesia Quick Evaluation

## 2018-02-05 NOTE — Progress Notes (Signed)
Patient Demographics:    [name], is a 84 y.o. female, DOB - 05-Sep-1928, RUE:[id number]  Admit date - 01/16/2018   Admitting Physician Jani Gravel, MD  Outpatient Primary MD for the patient is Hulan Fess, MD  LOS - 0   Chief Complaint  Patient presents with  . Abdominal Pain        Subjective:    [name] today has no fevers,    No chest pain,  Daughter at bedside, had soft BM, nausea but no emesis, RUQ abdominal pain persist,  Assessment  & Plan :    Active Problems:   Type 2 diabetes mellitus with complication (HCC)   Abdominal pain   UTI (urinary tract infection)   Nausea & vomiting   Diarrhea   Nausea and vomiting  Brief Summary 84 y.o.female,w/ hypertension, CAD s/p CABG, polymyalgia rheumatica , celiac disease, recent right ureteral stent placement 11/2017, with recurrent right upper quadrant abdominal pain associated with nausea, diarrhea since early June 2019, patient has had GI work-up including s/p EGD 01/24/2018=>widely patent and non-obstructing schatzki ring, acute gastritis, and small hiatal hernia, Colonoscopy=>diverticulosis and internal hemorrhoids admitted on 01/29/2018 with intractable emesis and diarrhea with abdominal pain   Plan:- 1)Abd Pain----- recurrent abd pain/dairrhea, abdominal pain is mostly in the right upper quadrant, recent gallbladder ultrasound shows sludge, HIDA scan with EF of 27%, discussed with [facility] , Dr Penelope Coop, [facility] consult appreciated, recommend a surgical consultation, discussed with surgical service clonidine 0.1 mg twice daily, for lap chole on 01/19/2018  2)HTN--stable, continue clonidine 0.1 mg twice daily, continue carvedilol, amlodipine 10 mg daily, may use PRN labetalol if BP remains elevated  3) hypothyroidism--continue levothyroxine  Code Status : Full   Disposition Plan  : Home   Consults  :  Gi/Surgery   DVT Prophylaxis  :  Lovenox     Lab Results  Component Value Date   PLT 309 02/05/2018    Inpatient Medications  Scheduled Meds: . amLODipine  10 mg Oral Daily  . aspirin EC  81 mg Oral Q1500  . azelastine  1 spray Each Nare BID  . carvedilol  1.5625 mg Oral BID WC  . cephALEXin  500 mg Oral Q12H  . cholecalciferol  4,000 Units Oral Q1500  . cloNIDine  0.1 mg Oral BID  . colestipol  2 g Oral Daily  . [START ON 02/07/2018] enoxaparin (LOVENOX) injection  30 mg Subcutaneous Q24H  . ferrous sulfate  325 mg Oral BID PC  . levothyroxine  50 mcg Oral QAC breakfast  . multivitamin with minerals  1 tablet Oral Q1500  . pantoprazole  40 mg Oral QAC breakfast  . potassium chloride  10 mEq Oral Daily  . predniSONE  8 mg Oral Daily  . [START ON 01/28/2018] pyridOXINE  100 mg Oral Weekly  . [START ON 01/21/2018] vitamin B-12  500 mcg Oral Weekly  . vitamin C  500 mg Oral Daily   Continuous Infusions: . [START ON 01/18/2018]  ceFAZolin (ANCEF) IV     PRN Meds:.acetaminophen **OR** acetaminophen, HYDROmorphone (DILAUDID) injection, labetalol, ondansetron (ZOFRAN) IV, ondansetron, traMADol    Anti-infectives (From admission, onward)   Start     Dose/Rate Route Frequency Ordered Stop   02/06/18 0600  ceFAZolin (ANCEF) IVPB 2g/100 mL premix     2 g 200 mL/hr over 30 Minutes Intravenous 30 min pre-op 02/05/18 1524     02/05/18 1200  cephALEXin (KEFLEX) capsule 500 mg     500 mg Oral Every 12 hours 02/05/18 1055 01/23/2018 0959        Objective:   Vitals:   02/06/2018 1352 01/26/2018 2331 02/05/18 0611 02/05/18 1442  BP: (!) 152/53 (!) 153/68 (!) 179/82 (!) 159/53  Pulse: 75 80 82 73  Resp: 17 18 20 18   Temp: 98.2 F (36.8 C) 98 F (36.7 C) 97.9 F (36.6 C) 98.4 F (36.9 C)  TempSrc: Oral Oral Oral Oral  SpO2: 92% 94% 98% 96%  Weight:   77.7 kg   Height:        Wt Readings from Last 3 Encounters:  02/05/18 77.7 kg  01/25/18 80.5 kg  01/08/18 80 kg     Intake/Output Summary (Last 24 hours) at 02/05/2018  1809 Last data filed at 02/05/2018 1100 Gross per 24 hour  Intake 680 ml  Output -  Net 680 ml     Physical Exam  Gen:- Awake Alert,  In no apparent distress  HEENT:- [place].AT, No sclera icterus Neck-Supple Neck,No JVD,.  Lungs-  CTAB , fairly symmetrical air movement CV- S1, S2 normal Abd-  +ve B.Sounds, Abd Soft, right upper quadrant and right-sided abdominal tenderness without rebound or guarding  extremity/Skin:- No  edema,   good pulses Psych-affect is appropriate, oriented x3 Neuro-no new focal deficits, no tremors   Data Review:   Micro Results Recent Results (from the past 240 hour(s))  Urine culture     Status: Abnormal   Collection Time: 01/28/2018  1:20 AM  Result Value Ref Range Status   Specimen Description   Final    URINE, RANDOM Performed at [facility] [address] [place], [place] [address]    Special Requests   Final    NONE Performed at [facility], [place] [address] [place], [place] [address]    Culture (A)  Final    >=100,000 COLONIES/mL GROUP B STREP(S.AGALACTIAE)ISOLATED TESTING AGAINST S. AGALACTIAE NOT ROUTINELY PERFORMED DUE TO PREDICTABILITY OF AMP/PEN/VAN SUSCEPTIBILITY. Performed at [facility], [place] [address] [place], [place] [address]    Report Status 02/05/2018 FINAL  Final  Gastrointestinal Panel by PCR , Stool     Status: None   Collection Time: 01/28/2018  8:00 PM  Result Value Ref Range Status   Campylobacter species NOT DETECTED NOT DETECTED Final   Plesimonas shigelloides NOT DETECTED NOT DETECTED Final   Salmonella species NOT DETECTED NOT DETECTED Final   Yersinia enterocolitica NOT DETECTED NOT DETECTED Final   Vibrio species NOT DETECTED NOT DETECTED Final   Vibrio cholerae NOT DETECTED NOT DETECTED Final   Enteroaggregative E coli (EAEC) NOT DETECTED NOT DETECTED Final   Enteropathogenic E coli (EPEC) NOT DETECTED NOT DETECTED Final   Enterotoxigenic E coli (ETEC) NOT DETECTED  NOT DETECTED Final   Shiga like toxin producing E coli (STEC) NOT DETECTED NOT DETECTED Final   Shigella/Enteroinvasive E coli (EIEC) NOT DETECTED NOT DETECTED Final   Cryptosporidium NOT DETECTED NOT DETECTED Final   Cyclospora cayetanensis NOT DETECTED NOT DETECTED Final   Entamoeba histolytica NOT DETECTED NOT DETECTED Final   Giardia lamblia NOT DETECTED NOT DETECTED Final   Adenovirus F40/41 NOT DETECTED NOT DETECTED Final   Astrovirus NOT DETECTED NOT DETECTED Final   Norovirus GI/GII NOT DETECTED  NOT DETECTED Final   Rotavirus A NOT DETECTED NOT DETECTED Final   Sapovirus (I, II, IV, and V) NOT DETECTED NOT DETECTED Final    Comment: Performed at [facility], [address], [place], [place] [address]    Radiology Reports Ct Abdomen Pelvis Wo Contrast  Result Date: 01/21/2018 CLINICAL DATA:  Abdominal pain with nausea and vomiting, history of recent stent placement EXAM: CT ABDOMEN AND PELVIS WITHOUT CONTRAST TECHNIQUE: Multidetector CT imaging of the abdomen and pelvis was performed following the standard protocol without IV contrast. COMPARISON:  12/25/2017 FINDINGS: Lower chest: No acute abnormality. Hepatobiliary: No focal liver abnormality is seen. No gallstones, gallbladder Ander thickening, or biliary dilatation. Pancreas: Unremarkable. No pancreatic ductal dilatation or surrounding inflammatory changes. Spleen: Normal in size without focal abnormality. Adrenals/Urinary Tract: Kidneys are well visualized bilaterally. Diffuse cortical thinning is noted on the left similar to that seen on the prior exam. Scattered renal cystic changes are noted. No definitive renal calculi are seen. Fullness of the right renal collecting system and proximal right ureter are again identified. A right ureteral stent is noted in satisfactory position. The bladder is well distended. Stomach/Bowel: Diverticular change of the colon is noted without evidence of diverticulitis. The appendix is within  normal limits. No obstructive or inflammatory changes are seen. Vascular/Lymphatic: Aortic atherosclerosis. No enlarged abdominal or pelvic lymph nodes. A few small retroperitoneal lymph nodes are identified not significant by size criteria. These are stable from the prior exam. Reproductive: Status post hysterectomy. Other: No abdominal Atienza hernia or abnormality. No abdominopelvic ascites. Musculoskeletal: Degenerative changes of the lumbar spine are seen. No acute bony abnormality is noted. IMPRESSION: Right ureteral stent in satisfactory position. There remains some fullness of the right renal collecting system and proximal right ureter likely related to reflux from the bladder. The known ureteral lesion is not well appreciated. Chronic changes as described above. Electronically Signed   By: Inez Catalina M.D.   On: 01/21/2018 07:51   Dg Chest 2 View  Result Date: 01/21/2018 CLINICAL DATA:  Pain and nausea.  Hypertension. EXAM: CHEST - 2 VIEW COMPARISON:  January 25, 2015 FINDINGS: There is no edema or consolidation. Heart is upper normal in size with pulmonary vascularity normal. No adenopathy. Patient is status post coronary artery bypass grafting. There is aortic atherosclerosis. There is thoracic dextroscoliosis. There is degenerative change throughout the thoracic spine. IMPRESSION: No edema or consolidation. Heart size within normal limits. Status post coronary artery bypass grafting. There is aortic atherosclerosis. Aortic Atherosclerosis (ICD10-I70.0). Electronically Signed   By: Lowella Grip III M.D.   On: 01/21/2018 09:14   Nm Hepato W/eject Fract  Result Date: 02/02/2018 CLINICAL DATA:  Upper abdominal pain with nausea and vomiting EXAM: NUCLEAR MEDICINE HEPATOBILIARY IMAGING WITH GALLBLADDER EF TECHNIQUE: Sequential images of the abdomen were obtained out to 60 minutes following intravenous administration of radiopharmaceutical. After oral ingestion of Ensure, gallbladder ejection fraction  was determined. At 60 min, normal ejection fraction is greater than 33%. RADIOPHARMACEUTICALS:  5.1 mCi Tc-58m  Choletec IV COMPARISON:  None. FINDINGS: Prompt uptake and biliary excretion of activity by the liver is seen. Gallbladder activity is visualized, consistent with patency of cystic duct. Biliary activity passes into small bowel, consistent with patent common bile duct. Calculated gallbladder ejection fraction is 27%. (Normal gallbladder ejection fraction with Ensure is greater than 33%.) IMPRESSION: Normal uptake and excretion of biliary tracer. Decreased gallbladder ejection fraction of 27%. Electronically Signed   By: Linus Mako.D.  On: 02/07/2018 13:55     CBC Recent Labs  Lab 02/03/18 2139 02/05/18 0423  WBC 16.9* 13.0*  HGB 11.2* 9.5*  HCT 35.6* 31.1*  PLT 392 309  MCV 77.7* 78.9  MCH 24.5* 24.1*  MCHC 31.5 30.5  RDW 18.2* 17.9*    Chemistries  Recent Labs  Lab 02/03/18 2139 02/05/18 0423  NA 139 140  K 3.8 4.9  CL 98 105  CO2 26 27  GLUCOSE 155* 125*  BUN 25* 18  CREATININE 1.86* 1.45*  CALCIUM 8.9 8.3*  AST 22 16  ALT 17 14  ALKPHOS 89 67  BILITOT 0.8 0.8   ------------------------------------------------------------------------------------------------------------------ No results for input(s): CHOL, HDL, LDLCALC, TRIG, CHOLHDL, LDLDIRECT in the last 72 hours.  Lab Results  Component Value Date   HGBA1C 5.9 (H) 01/21/2018   ------------------------------------------------------------------------------------------------------------------ No results for input(s): TSH, T4TOTAL, T3FREE, THYROIDAB in the last 72 hours.  Invalid input(s): FREET3 ------------------------------------------------------------------------------------------------------------------ No results for input(s): VITAMINB12, FOLATE, FERRITIN, TIBC, IRON, RETICCTPCT in the last 72 hours.  Coagulation profile No results for input(s): INR, PROTIME in the last 168 hours.  No results  for input(s): DDIMER in the last 72 hours.  Cardiac Enzymes No results for input(s): CKMB, TROPONINI, MYOGLOBIN in the last 168 hours.  Invalid input(s): CK ------------------------------------------------------------------------------------------------------------------ No results found for: BNP   Roxan Hockey M.D on 02/05/2018 at 6:09 PM   Go to [email or web address] - password [email or web address] for contact info  [facility] - Office  [phone]

## 2018-02-05 NOTE — Discharge Instructions (Signed)

## 2018-02-06 ENCOUNTER — Inpatient Hospital Stay (HOSPITAL_COMMUNITY): Payer: Medicare Other | Admitting: Anesthesiology

## 2018-02-06 ENCOUNTER — Encounter (HOSPITAL_COMMUNITY): Payer: Self-pay | Admitting: Anesthesiology

## 2018-02-06 ENCOUNTER — Encounter (HOSPITAL_COMMUNITY): Admission: EM | Disposition: E | Payer: Self-pay | Source: Home / Self Care | Attending: Family Medicine

## 2018-02-06 HISTORY — PX: CHOLECYSTECTOMY: SHX55

## 2018-02-06 LAB — GLUCOSE, CAPILLARY: Glucose-Capillary: 80 mg/dL (ref 70–99)

## 2018-02-06 LAB — COMPREHENSIVE METABOLIC PANEL
ALT: 14 U/L (ref 0–44)
ANION GAP: 12 (ref 5–15)
AST: 19 U/L (ref 15–41)
Albumin: 3.1 g/dL — ABNORMAL LOW (ref 3.5–5.0)
Alkaline Phosphatase: 86 U/L (ref 38–126)
BUN: 15 mg/dL (ref 8–23)
CHLORIDE: 102 mmol/L (ref 98–111)
CO2: 25 mmol/L (ref 22–32)
Calcium: 8.9 mg/dL (ref 8.9–10.3)
Creatinine, Ser: 1.42 mg/dL — ABNORMAL HIGH (ref 0.44–1.00)
GFR, EST AFRICAN AMERICAN: 37 mL/min — AB (ref >60)
GFR, EST NON AFRICAN AMERICAN: 32 mL/min — AB (ref >60)
Glucose, Bld: 94 mg/dL (ref 70–99)
POTASSIUM: 4.4 mmol/L (ref 3.5–5.1)
SODIUM: 139 mmol/L (ref 135–145)
Total Bilirubin: 1 mg/dL (ref 0.3–1.2)
Total Protein: 6.8 g/dL (ref 6.5–8.1)

## 2018-02-06 LAB — CBC
HEMATOCRIT: 36.5 % (ref 36.0–46.0)
HEMOGLOBIN: 11.1 g/dL — AB (ref 12.0–15.0)
MCH: 24.2 pg — AB (ref 26.0–34.0)
MCHC: 30.4 g/dL (ref 30.0–36.0)
MCV: 79.7 fL (ref 78.0–100.0)
Platelets: 409 10*3/uL — ABNORMAL HIGH (ref 150–400)
RBC: 4.58 MIL/uL (ref 3.87–5.11)
RDW: 18.2 % — ABNORMAL HIGH (ref 11.5–15.5)
WBC: 17.5 10*3/uL — AB (ref 4.0–10.5)

## 2018-02-06 SURGERY — LAPAROSCOPIC CHOLECYSTECTOMY
Anesthesia: General

## 2018-02-06 MED ORDER — FENTANYL CITRATE (PF) 100 MCG/2ML IJ SOLN
INTRAMUSCULAR | Status: AC
  Filled 2018-02-06: qty 2

## 2018-02-06 MED ORDER — LACTATED RINGERS IV SOLN
INTRAVENOUS | Status: DC
  Administered 2018-02-06: 11:00:00 via INTRAVENOUS

## 2018-02-06 MED ORDER — HYDROMORPHONE HCL 1 MG/ML IJ SOLN
0.5000 mg | INTRAMUSCULAR | Status: DC | PRN
  Administered 2018-02-06 – 2018-02-08 (×5): 0.5 mg via INTRAVENOUS
  Filled 2018-02-06 (×6): qty 0.5

## 2018-02-06 MED ORDER — IOPAMIDOL (ISOVUE-300) INJECTION 61%
INTRAVENOUS | Status: AC
  Filled 2018-02-06: qty 50

## 2018-02-06 MED ORDER — SUGAMMADEX SODIUM 200 MG/2ML IV SOLN
INTRAVENOUS | Status: AC
  Filled 2018-02-06: qty 4

## 2018-02-06 MED ORDER — LACTATED RINGERS IV SOLN
INTRAVENOUS | Status: DC | PRN
  Administered 2018-02-06: 08:00:00 via INTRAVENOUS

## 2018-02-06 MED ORDER — LIDOCAINE 2% (20 MG/ML) 5 ML SYRINGE
INTRAMUSCULAR | Status: DC | PRN
  Administered 2018-02-06: 100 mg via INTRAVENOUS

## 2018-02-06 MED ORDER — PROPOFOL 10 MG/ML IV BOLUS
INTRAVENOUS | Status: AC
  Filled 2018-02-06: qty 20

## 2018-02-06 MED ORDER — ROCURONIUM BROMIDE 10 MG/ML (PF) SYRINGE
PREFILLED_SYRINGE | INTRAVENOUS | Status: DC | PRN
  Administered 2018-02-06: 40 mg via INTRAVENOUS

## 2018-02-06 MED ORDER — BUPIVACAINE-EPINEPHRINE 0.25% -1:200000 IJ SOLN
INTRAMUSCULAR | Status: DC | PRN
  Administered 2018-02-06: 30 mL

## 2018-02-06 MED ORDER — SUGAMMADEX SODIUM 200 MG/2ML IV SOLN
INTRAVENOUS | Status: DC | PRN
  Administered 2018-02-06: 200 mg via INTRAVENOUS

## 2018-02-06 MED ORDER — ONDANSETRON HCL 4 MG/2ML IJ SOLN: 4.0000 mg | Freq: Once | INTRAMUSCULAR | Status: DC | PRN

## 2018-02-06 MED ORDER — ONDANSETRON HCL 4 MG/2ML IJ SOLN
INTRAMUSCULAR | Status: AC
  Filled 2018-02-06: qty 2

## 2018-02-06 MED ORDER — 0.9 % SODIUM CHLORIDE (POUR BTL) OPTIME
TOPICAL | Status: DC | PRN
  Administered 2018-02-06: 1000 mL

## 2018-02-06 MED ORDER — DEXAMETHASONE SODIUM PHOSPHATE 10 MG/ML IJ SOLN
INTRAMUSCULAR | Status: AC
  Filled 2018-02-06: qty 1

## 2018-02-06 MED ORDER — PREDNISONE 10 MG PO TABS
10.0000 mg | ORAL_TABLET | Freq: Every day | ORAL | Status: DC
  Administered 2018-02-06 – 2018-02-08 (×3): 10 mg via ORAL
  Filled 2018-02-06 (×3): qty 1

## 2018-02-06 MED ORDER — ACETAMINOPHEN 10 MG/ML IV SOLN
INTRAVENOUS | Status: AC
  Filled 2018-02-06: qty 100

## 2018-02-06 MED ORDER — GLYCOPYRROLATE PF 0.2 MG/ML IJ SOSY
PREFILLED_SYRINGE | INTRAMUSCULAR | Status: AC
  Filled 2018-02-06: qty 1

## 2018-02-06 MED ORDER — FENTANYL CITRATE (PF) 100 MCG/2ML IJ SOLN
25.0000 ug | INTRAMUSCULAR | Status: DC | PRN
  Administered 2018-02-06: 25 ug via INTRAVENOUS

## 2018-02-06 MED ORDER — LIDOCAINE 2% (20 MG/ML) 5 ML SYRINGE
INTRAMUSCULAR | Status: AC
  Filled 2018-02-06: qty 5

## 2018-02-06 MED ORDER — ACETAMINOPHEN 10 MG/ML IV SOLN
1000.0000 mg | Freq: Once | INTRAVENOUS | Status: DC | PRN
  Administered 2018-02-06: 1000 mg via INTRAVENOUS

## 2018-02-06 MED ORDER — DEXAMETHASONE SODIUM PHOSPHATE 10 MG/ML IJ SOLN
INTRAMUSCULAR | Status: DC | PRN
  Administered 2018-02-06: 10 mg via INTRAVENOUS

## 2018-02-06 MED ORDER — DEXMEDETOMIDINE HCL IN NACL 200 MCG/50ML IV SOLN
INTRAVENOUS | Status: DC | PRN
  Administered 2018-02-06 (×3): 8 ug via INTRAVENOUS

## 2018-02-06 MED ORDER — GLYCOPYRROLATE PF 0.2 MG/ML IJ SOSY
PREFILLED_SYRINGE | INTRAMUSCULAR | Status: DC | PRN
  Administered 2018-02-06: .2 mg via INTRAVENOUS

## 2018-02-06 MED ORDER — BUPIVACAINE-EPINEPHRINE (PF) 0.25% -1:200000 IJ SOLN
INTRAMUSCULAR | Status: AC
  Filled 2018-02-06: qty 30

## 2018-02-06 MED ORDER — LACTATED RINGERS IR SOLN
Status: DC | PRN
  Administered 2018-02-06: 1000 mL

## 2018-02-06 MED ORDER — PHENYLEPHRINE 40 MCG/ML (10ML) SYRINGE FOR IV PUSH (FOR BLOOD PRESSURE SUPPORT)
PREFILLED_SYRINGE | INTRAVENOUS | Status: DC | PRN
  Administered 2018-02-06 (×2): 40 ug via INTRAVENOUS

## 2018-02-06 MED ORDER — FENTANYL CITRATE (PF) 100 MCG/2ML IJ SOLN
INTRAMUSCULAR | Status: DC | PRN
  Administered 2018-02-06 (×2): 50 ug via INTRAVENOUS

## 2018-02-06 MED ORDER — PROPOFOL 10 MG/ML IV BOLUS
INTRAVENOUS | Status: DC | PRN
  Administered 2018-02-06: 90 mg via INTRAVENOUS

## 2018-02-06 SURGICAL SUPPLY — 43 items
ADH SKN CLS APL DERMABOND .7 (GAUZE/BANDAGES/DRESSINGS) ×1
APPLIER CLIP ROT 10 11.4 M/L (STAPLE) ×3
APR CLP MED LRG 11.4X10 (STAPLE) ×1
BAG SPEC RTRVL LRG 6X4 10 (ENDOMECHANICALS) ×1
CABLE HIGH FREQUENCY MONO STRZ (ELECTRODE) ×3 IMPLANT
CHLORAPREP W/TINT 26ML (MISCELLANEOUS) ×3 IMPLANT
CLIP APPLIE ROT 10 11.4 M/L (STAPLE) ×1 IMPLANT
COVER MAYO STAND STRL (DRAPES) ×2 IMPLANT
COVER SURGICAL LIGHT HANDLE (MISCELLANEOUS) ×3 IMPLANT
DECANTER SPIKE VIAL GLASS SM (MISCELLANEOUS) ×3 IMPLANT
DERMABOND ADVANCED (GAUZE/BANDAGES/DRESSINGS) ×2
DERMABOND ADVANCED .7 DNX12 (GAUZE/BANDAGES/DRESSINGS) ×1 IMPLANT
DRAPE C-ARM 42X120 X-RAY (DRAPES) IMPLANT
ELECT REM PT RETURN 15FT ADLT (MISCELLANEOUS) ×3 IMPLANT
GLOVE BIO SURGEON STRL SZ 6 (GLOVE) ×3 IMPLANT
GLOVE BIO SURGEON STRL SZ8 (GLOVE) ×2 IMPLANT
GLOVE BIOGEL PI IND STRL 6.5 (GLOVE) IMPLANT
GLOVE BIOGEL PI IND STRL 7.0 (GLOVE) IMPLANT
GLOVE BIOGEL PI IND STRL 7.5 (GLOVE) IMPLANT
GLOVE BIOGEL PI INDICATOR 6.5 (GLOVE) ×2
GLOVE BIOGEL PI INDICATOR 7.0 (GLOVE) ×4
GLOVE BIOGEL PI INDICATOR 7.5 (GLOVE) ×2
GLOVE ECLIPSE 6.5 STRL STRAW (GLOVE) ×2 IMPLANT
GLOVE INDICATOR 6.5 STRL GRN (GLOVE) ×3 IMPLANT
GOWN STRL REUS W/TWL LRG LVL3 (GOWN DISPOSABLE) ×7 IMPLANT
GOWN STRL REUS W/TWL XL LVL3 (GOWN DISPOSABLE) ×4 IMPLANT
GRASPER SUT TROCAR 14GX15 (MISCELLANEOUS) ×3 IMPLANT
HEMOSTAT SNOW SURGICEL 2X4 (HEMOSTASIS) IMPLANT
KIT BASIN OR (CUSTOM PROCEDURE TRAY) ×3 IMPLANT
NDL INSUFFLATION 14GA 120MM (NEEDLE) ×1 IMPLANT
NEEDLE INSUFFLATION 14GA 120MM (NEEDLE) ×3 IMPLANT
POUCH SPECIMEN RETRIEVAL 10MM (ENDOMECHANICALS) ×3 IMPLANT
SCISSORS LAP 5X35 DISP (ENDOMECHANICALS) ×3 IMPLANT
SET CHOLANGIOGRAPH MIX (MISCELLANEOUS) IMPLANT
SET IRRIG TUBING LAPAROSCOPIC (IRRIGATION / IRRIGATOR) ×3 IMPLANT
SLEEVE XCEL OPT CAN 5 100 (ENDOMECHANICALS) ×6 IMPLANT
SUT MNCRL AB 4-0 PS2 18 (SUTURE) ×5 IMPLANT
TOWEL OR 17X26 10 PK STRL BLUE (TOWEL DISPOSABLE) ×3 IMPLANT
TOWEL OR NON WOVEN STRL DISP B (DISPOSABLE) IMPLANT
TRAY LAPAROSCOPIC (CUSTOM PROCEDURE TRAY) ×3 IMPLANT
TROCAR BLADELESS OPT 5 100 (ENDOMECHANICALS) ×3 IMPLANT
TROCAR XCEL 12X100 BLDLESS (ENDOMECHANICALS) ×3 IMPLANT
TUBING INSUF HEATED (TUBING) ×3 IMPLANT

## 2018-02-06 NOTE — Progress Notes (Signed)
4th floor aware pt will be returning in 20  minutes

## 2018-02-06 NOTE — Anesthesia Procedure Notes (Signed)
Procedure Name: Intubation Date/Time: 01/30/2018 9:16 AM Performed by: Lavina Hamman, CRNA Pre-anesthesia Checklist: Patient identified, Emergency Drugs available, Suction available, Patient being monitored and Timeout performed Patient Re-evaluated:Patient Re-evaluated prior to induction Oxygen Delivery Method: Circle system utilized Preoxygenation: Pre-oxygenation with 100% oxygen Induction Type: IV induction Ventilation: Mask ventilation without difficulty Laryngoscope Size: Mac and 3 Grade View: Grade I Tube type: Oral Tube size: 7.0 mm Number of attempts: 1 Airway Equipment and Method: Stylet Placement Confirmation: ETT inserted through vocal cords under direct vision,  positive ETCO2,  CO2 detector and breath sounds checked- equal and bilateral Secured at: 21 cm Tube secured with: Tape Dental Injury: Teeth and Oropharynx as per pre-operative assessment

## 2018-02-06 NOTE — Anesthesia Postprocedure Evaluation (Signed)
Anesthesia Post Note  Patient: [name]  Procedure(s) Performed: LAPAROSCOPIC CHOLECYSTECTOMY (N/A )     Patient location during evaluation: PACU Anesthesia Type: General Level of consciousness: sedated Pain management: pain level controlled Vital Signs Assessment: post-procedure vital signs reviewed and stable Respiratory status: spontaneous breathing and respiratory function stable Cardiovascular status: stable Postop Assessment: no apparent nausea or vomiting Anesthetic complications: no    Last Vitals:  Vitals:   01/22/2018 1100 01/17/2018 1114  BP: (!) 156/75 (!) 166/73  Pulse: 66 71  Resp: 17 13  Temp: (!) 36.4 C 36.4 C  SpO2: 95% 97%    Last Pain:  Vitals:   02/07/2018 1200  TempSrc:   PainSc: Asleep                 Jesenya Bowditch DANIEL

## 2018-02-06 NOTE — Transfer of Care (Signed)
Immediate Anesthesia Transfer of Care Note  Patient: [name]  Procedure(s) Performed: Procedure(s): LAPAROSCOPIC CHOLECYSTECTOMY (N/A)  Patient Location: PACU  Anesthesia Type:General  Level of Consciousness:  sedated, patient cooperative and responds to stimulation  Airway & Oxygen Therapy:Patient Spontanous Breathing and Patient connected to face mask oxgen  Post-op Assessment:  Report given to PACU RN and Post -op Vital signs reviewed and stable  Post vital signs:  Reviewed and stable  Last Vitals:  Vitals:   02/05/18 2047 02/01/2018 0425  BP: (!) 146/48 (!) 157/65  Pulse: 76 69  Resp: 16 16  Temp: 36.7 C 36.8 C  SpO2: 68% 15%    Complications: No apparent anesthesia complications

## 2018-02-06 NOTE — Progress Notes (Signed)
Patient Demographics:    [name], is a 84 y.o. female, DOB - 12/12/1928, XVQ:[id number]  Admit date - 01/27/2018   Admitting Physician Jani Gravel, MD  Outpatient Primary MD for the patient is Hulan Fess, MD  LOS - 1   Chief Complaint  Patient presents with  . Abdominal Pain        Subjective:    [name] today has no fevers,    No chest pain,  Resting post op , RN at bedside  Assessment  & Plan :    Active Problems:   Type 2 diabetes mellitus with complication (HCC)   Abdominal pain   UTI (urinary tract infection)   Nausea & vomiting   Diarrhea   Nausea and vomiting   Brief Summary 84 y.o.female,w/ hypertension, CAD s/p CABG, polymyalgia rheumatica , celiac disease, recent right ureteral stent placement 11/2017, with recurrent right upper quadrant abdominal pain associated with nausea, diarrhea since early June 2019, patient has had GI work-up including s/p EGD 01/24/2018=>widely patent and non-obstructing schatzki ring, acute gastritis, and small hiatal hernia, Colonoscopy=>diverticulosis and internal hemorrhoids admitted on 01/31/2018 with intractable emesis and diarrhea with abdominal pain   Plan:- 1)Abd Pain-----  recurrent abd pain/dairrhea, abdominal pain is mostly in the right upper quadrant, recent gallbladder ultrasound shows sludge, HIDA scan with EF of 27%, discussed with [facility] , Dr Penelope Coop, [facility] consult appreciated, surgical consult appreciated patient is status post lap chole on 02/13/2018, further postop management per surgical team.. Continue Colestid  2)HTN--stable, continue clonidine 0.1 mg twice daily, continue carvedilol, amlodipine 10 mg daily, may use PRN labetalol if BP remains elevated  3) hypothyroidism--continue levothyroxine 50 mcg daily  Code Status : Full   Disposition Plan  : Home   Consults  :  Gi/Surgery   DVT Prophylaxis  :  Lovenox   Lab Results    Component Value Date   PLT 409 (H) 01/19/2018    Inpatient Medications  Scheduled Meds: . amLODipine  10 mg Oral Daily  . aspirin EC  81 mg Oral Q1500  . azelastine  1 spray Each Nare BID  . carvedilol  1.5625 mg Oral BID WC  . cephALEXin  500 mg Oral Q12H  . cholecalciferol  4,000 Units Oral Q1500  . cloNIDine  0.1 mg Oral BID  . colestipol  2 g Oral Daily  . [START ON 02/07/2018] enoxaparin (LOVENOX) injection  30 mg Subcutaneous Q24H  . fentaNYL      . ferrous sulfate  325 mg Oral BID PC  . levothyroxine  50 mcg Oral QAC breakfast  . multivitamin with minerals  1 tablet Oral Q1500  . pantoprazole  40 mg Oral QAC breakfast  . potassium chloride  10 mEq Oral Daily  . predniSONE  10 mg Oral Q breakfast  . pyridOXINE  100 mg Oral Weekly  . vitamin B-12  500 mcg Oral Weekly  . vitamin C  500 mg Oral Daily   Continuous Infusions: . acetaminophen     PRN Meds:.acetaminophen **OR** acetaminophen, HYDROmorphone (DILAUDID) injection, labetalol, ondansetron (ZOFRAN) IV, ondansetron, traMADol    Anti-infectives (From admission, onward)   Start     Dose/Rate Route Frequency Ordered Stop   01/27/2018 0600  ceFAZolin (ANCEF) IVPB 2g/100  mL premix     2 g 200 mL/hr over 30 Minutes Intravenous 30 min pre-op 02/05/18 1524 01/27/2018 0905   02/05/18 1200  cephALEXin (KEFLEX) capsule 500 mg     500 mg Oral Every 12 hours 02/05/18 1055 02/01/2018 0959        Objective:   Vitals:   01/27/2018 1045 02/07/2018 1100 02/11/2018 1114 02/03/2018 1321  BP: (!) 165/86 (!) 156/75 (!) 166/73 (!) 161/69  Pulse: 71 66 71 72  Resp: 15 17 13 16   Temp:  (!) 97.5 F (36.4 C) 97.6 F (36.4 C) 97.7 F (36.5 C)  TempSrc:    Oral  SpO2: 100% 95% 97% 96%  Weight:      Height:        Wt Readings from Last 3 Encounters:  01/18/2018 77 kg  01/25/18 80.5 kg  01/08/18 80 kg     Intake/Output Summary (Last 24 hours) at 01/16/2018 1500 Last data filed at 02/14/2018 1400 Gross per 24 hour  Intake 1327.5 ml   Output 25 ml  Net 1302.5 ml     Physical Exam  Gen:- Awake Alert,  In no apparent distress  HEENT:- [place].AT, No sclera icterus Neck-Supple Neck,No JVD,.  Lungs-  CTAB , fairly symmetrical air movement CV- S1, S2 normal Abd-  +ve B.Sounds, Abd Soft, appropriate postop tenderness, , postop wound/incisions looks clean dry and intact extremity/Skin:- No  edema,   good pulses Psych-affect is appropriate, oriented x3 Neuro-no new focal deficits, no tremors   Data Review:   Micro Results Recent Results (from the past 240 hour(s))  Urine culture     Status: Abnormal   Collection Time: 01/25/2018  1:20 AM  Result Value Ref Range Status   Specimen Description   Final    URINE, RANDOM Performed at [facility] [address] [place], [place] [address]    Special Requests   Final    NONE Performed at [facility], [place] [address] [place], [place] [address]    Culture (A)  Final    >=100,000 COLONIES/mL GROUP B STREP(S.AGALACTIAE)ISOLATED TESTING AGAINST S. AGALACTIAE NOT ROUTINELY PERFORMED DUE TO PREDICTABILITY OF AMP/PEN/VAN SUSCEPTIBILITY. Performed at [facility], [place] [address], [place], [place] [address]    Report Status 02/05/2018 FINAL  Final  Gastrointestinal Panel by PCR , Stool     Status: None   Collection Time: 01/20/2018  8:00 PM  Result Value Ref Range Status   Campylobacter species NOT DETECTED NOT DETECTED Final   Plesimonas shigelloides NOT DETECTED NOT DETECTED Final   Salmonella species NOT DETECTED NOT DETECTED Final   Yersinia enterocolitica NOT DETECTED NOT DETECTED Final   Vibrio species NOT DETECTED NOT DETECTED Final   Vibrio cholerae NOT DETECTED NOT DETECTED Final   Enteroaggregative E coli (EAEC) NOT DETECTED NOT DETECTED Final   Enteropathogenic E coli (EPEC) NOT DETECTED NOT DETECTED Final   Enterotoxigenic E coli (ETEC) NOT DETECTED NOT DETECTED Final   Shiga like toxin producing E coli (STEC) NOT DETECTED  NOT DETECTED Final   Shigella/Enteroinvasive E coli (EIEC) NOT DETECTED NOT DETECTED Final   Cryptosporidium NOT DETECTED NOT DETECTED Final   Cyclospora cayetanensis NOT DETECTED NOT DETECTED Final   Entamoeba histolytica NOT DETECTED NOT DETECTED Final   Giardia lamblia NOT DETECTED NOT DETECTED Final   Adenovirus F40/41 NOT DETECTED NOT DETECTED Final   Astrovirus NOT DETECTED NOT DETECTED Final   Norovirus GI/GII NOT DETECTED NOT DETECTED Final   Rotavirus A  NOT DETECTED NOT DETECTED Final   Sapovirus (I, II, IV, and V) NOT DETECTED NOT DETECTED Final    Comment: Performed at [facility], [address], [place], [place] [address]    Radiology Reports Ct Abdomen Pelvis Wo Contrast  Result Date: 01/21/2018 CLINICAL DATA:  Abdominal pain with nausea and vomiting, history of recent stent placement EXAM: CT ABDOMEN AND PELVIS WITHOUT CONTRAST TECHNIQUE: Multidetector CT imaging of the abdomen and pelvis was performed following the standard protocol without IV contrast. COMPARISON:  12/25/2017 FINDINGS: Lower chest: No acute abnormality. Hepatobiliary: No focal liver abnormality is seen. No gallstones, gallbladder Astacio thickening, or biliary dilatation. Pancreas: Unremarkable. No pancreatic ductal dilatation or surrounding inflammatory changes. Spleen: Normal in size without focal abnormality. Adrenals/Urinary Tract: Kidneys are well visualized bilaterally. Diffuse cortical thinning is noted on the left similar to that seen on the prior exam. Scattered renal cystic changes are noted. No definitive renal calculi are seen. Fullness of the right renal collecting system and proximal right ureter are again identified. A right ureteral stent is noted in satisfactory position. The bladder is well distended. Stomach/Bowel: Diverticular change of the colon is noted without evidence of diverticulitis. The appendix is within normal limits. No obstructive or inflammatory changes are seen.  Vascular/Lymphatic: Aortic atherosclerosis. No enlarged abdominal or pelvic lymph nodes. A few small retroperitoneal lymph nodes are identified not significant by size criteria. These are stable from the prior exam. Reproductive: Status post hysterectomy. Other: No abdominal Bayliss hernia or abnormality. No abdominopelvic ascites. Musculoskeletal: Degenerative changes of the lumbar spine are seen. No acute bony abnormality is noted. IMPRESSION: Right ureteral stent in satisfactory position. There remains some fullness of the right renal collecting system and proximal right ureter likely related to reflux from the bladder. The known ureteral lesion is not well appreciated. Chronic changes as described above. Electronically Signed   By: Inez Catalina M.D.   On: 01/21/2018 07:51   Dg Chest 2 View  Result Date: 01/21/2018 CLINICAL DATA:  Pain and nausea.  Hypertension. EXAM: CHEST - 2 VIEW COMPARISON:  January 25, 2015 FINDINGS: There is no edema or consolidation. Heart is upper normal in size with pulmonary vascularity normal. No adenopathy. Patient is status post coronary artery bypass grafting. There is aortic atherosclerosis. There is thoracic dextroscoliosis. There is degenerative change throughout the thoracic spine. IMPRESSION: No edema or consolidation. Heart size within normal limits. Status post coronary artery bypass grafting. There is aortic atherosclerosis. Aortic Atherosclerosis (ICD10-I70.0). Electronically Signed   By: Lowella Grip III M.D.   On: 01/21/2018 09:14   Nm Hepato W/eject Fract  Result Date: 02/03/2018 CLINICAL DATA:  Upper abdominal pain with nausea and vomiting EXAM: NUCLEAR MEDICINE HEPATOBILIARY IMAGING WITH GALLBLADDER EF TECHNIQUE: Sequential images of the abdomen were obtained out to 60 minutes following intravenous administration of radiopharmaceutical. After oral ingestion of Ensure, gallbladder ejection fraction was determined. At 60 min, normal ejection fraction is greater  than 33%. RADIOPHARMACEUTICALS:  5.1 mCi Tc-45m  Choletec IV COMPARISON:  None. FINDINGS: Prompt uptake and biliary excretion of activity by the liver is seen. Gallbladder activity is visualized, consistent with patency of cystic duct. Biliary activity passes into small bowel, consistent with patent common bile duct. Calculated gallbladder ejection fraction is 27%. (Normal gallbladder ejection fraction with Ensure is greater than 33%.) IMPRESSION: Normal uptake and excretion of biliary tracer. Decreased gallbladder ejection fraction of 27%. Electronically Signed   By: Inez Catalina M.D.   On: 01/19/2018 13:55  CBC Recent Labs  Lab 02/03/18 2139 02/05/18 0423 02/15/2018 0422  WBC 16.9* 13.0* 17.5*  HGB 11.2* 9.5* 11.1*  HCT 35.6* 31.1* 36.5  PLT 392 309 409*  MCV 77.7* 78.9 79.7  MCH 24.5* 24.1* 24.2*  MCHC 31.5 30.5 30.4  RDW 18.2* 17.9* 18.2*    Chemistries  Recent Labs  Lab 02/03/18 2139 02/05/18 0423 02/01/2018 0422  NA 139 140 139  K 3.8 4.9 4.4  CL 98 105 102  CO2 26 27 25   GLUCOSE 155* 125* 94  BUN 25* 18 15  CREATININE 1.86* 1.45* 1.42*  CALCIUM 8.9 8.3* 8.9  AST 22 16 19   ALT 17 14 14   ALKPHOS 89 67 86  BILITOT 0.8 0.8 1.0   ------------------------------------------------------------------------------------------------------------------ No results for input(s): CHOL, HDL, LDLCALC, TRIG, CHOLHDL, LDLDIRECT in the last 72 hours.  Lab Results  Component Value Date   HGBA1C 5.9 (H) 01/21/2018   ------------------------------------------------------------------------------------------------------------------ No results for input(s): TSH, T4TOTAL, T3FREE, THYROIDAB in the last 72 hours.  Invalid input(s): FREET3 ------------------------------------------------------------------------------------------------------------------ No results for input(s): VITAMINB12, FOLATE, FERRITIN, TIBC, IRON, RETICCTPCT in the last 72 hours.  Coagulation profile No results for  input(s): INR, PROTIME in the last 168 hours.  No results for input(s): DDIMER in the last 72 hours.  Cardiac Enzymes No results for input(s): CKMB, TROPONINI, MYOGLOBIN in the last 168 hours.  Invalid input(s): CK ------------------------------------------------------------------------------------------------------------------ No results found for: BNP   Roxan Hockey M.D on 01/22/2018 at 3:00 PM   Go to [email or web address] - password [email or web address] for contact info  [facility] - Office  [phone]

## 2018-02-06 NOTE — Progress Notes (Signed)
Plans for lap chole noted--pt currently in OR.  Will sign off--please call if pt has postoperative sx that you would like [place] to evaluate.  Upon dischg, would recomm pt f/u w/ her primary gastroenterologist, Dr. Michail Sermon, to tie up any remaining loose ends w/ respect to her GI tract.  Cleotis Nipper, M.D. Pager [phone] If no answer or after 5 PM call [phone]

## 2018-02-06 NOTE — Interval H&P Note (Signed)
History and Physical Interval Note:  02/08/2018 8:42 AM  [name]  has presented today for surgery, with the diagnosis of biliary dyskensia  The various methods of treatment have been discussed with the patient and family. After consideration of risks, benefits and other options for treatment, the patient has consented to  Procedure(s): LAPAROSCOPIC CHOLECYSTECTOMY (N/A) as a surgical intervention .  The patient's history has been reviewed, patient examined, no change in status, stable for surgery.  I have reviewed the patient's chart and labs.  Questions were answered to the patient's satisfaction.     Jacek Colson Rich Brave

## 2018-02-06 NOTE — Op Note (Signed)
Operative Note  [name] 84 y.o. female [id number]  02/01/2018  Surgeon: Clovis Riley MD  Assistant: Will Creig Hines, PA-C  Procedure performed: Laparoscopic Cholecystectomy  Preop diagnosis: biliary dyskinesia Post-op diagnosis/intraop findings: same, chronic cholecystitis, solitary inflammatory adhesion of bowel to right lower abdominal [place]  Specimens: gallbladder  EBL: minimal  Complications: none  Description of procedure: After obtaining informed consent the patient was brought to the operating room. Prophylactic antibiotics and subcutaneous heparin were administered. SCD's were applied. General endotracheal anesthesia was initiated and a formal time-out was performed. The abdomen was prepped and draped in the usual sterile fashion and the abdomen was entered using visiport technique in the left upper quadrant after instilling the site with local. Insufflation to 108mmHg was obtained and gross inspection revealed no evidence of injury from our entry. Three 100mm trocars were introduced in the supraumbilical, right midclavicular and right anterior axillary lines under direct visualization and following infiltration with local. An 49mm trocar was placed in the epigastrium. The gallbladder was retracted cephalad and the infundibulum was retracted laterally. A combination of hook electrocautery and blunt dissection was utilized to clear the peritoneum from the neck and cystic duct, circumferentially isolating the cystic artery and cystic duct and lifting the gallbladder from the cystic plate. The critical view of safety was achieved with the cystic artery, cystic duct, and liver bed visualized between them with no other structures. The artery was clipped with a two clips proximally and one distally and divided as was the cystic duct with three clips on the proximal end. The gallbladder was dissected from the liver plate using electrocautery. Once freed the gallbladder was placed in an  endocatch bag and removed through the epigastric trocar site. A small amount of bleeding on the liver bed was controlled with cautery. Some bile had been spilled from the gallbladder during its dissection from the liver bed. This was aspirated and the right upper quadrant was irrigated copiously until the effluent was clear. Hemostasis was once again confirmed, and reinspection of the abdomen revealed no injuries. The clips were well opposed without any bile leak from the duct or the liver bed. There was noted to be a solitary inflammatory adhesion of bowel to the right lower abdominal [place]. This was taken down sharply taking a small amount of peritoneum to ensure bowel was protected. Hemostasis on the abdominal [place] was confirmed with cautery. The bowel was inspected and confirmed free of injury. No other gross abnormalities were visible to explain the patient's pain. The 83mm trocar site in the epigastrium was closed with a 0 vicryl in the fascia under direct visualization using a PMI device. The abdomen was desufflated and all trocars removed. The skin incisions were closed with running subcuticular monocryl and Dermabond. The patient was awakened, extubated and transported to the recovery room in stable condition.   All counts were correct at the completion of the case.

## 2018-02-06 NOTE — Consult Note (Addendum)
   [facility] CM Inpatient Consult   01/16/2018  [name] 01/08/1929 [id number]   [facility] Care Management referral received due hospital readmission.   Went to bedside to speak with patient and son. [name] just recently got back from surgery. Patient's son states he is not sure if patient would need [facility] Care Management services. Son reports that Mrs. Villarreal daughter helps her at home and handles patient's affairs. Made both son and patient aware that they can contact [facility] Management office should they change their minds. Provided [facility] Care Management brochure with contact information and 24-hr nurse advice line magnet.   Will make inpatient RNCM aware that [facility] Management services were declined at this time.    Marthenia Rolling, MSN-Ed, RN,BSN [facility] Liaison [phone]

## 2018-02-07 ENCOUNTER — Encounter (HOSPITAL_COMMUNITY): Payer: Self-pay | Admitting: Surgery

## 2018-02-07 MED ORDER — OXYCODONE HCL 5 MG PO TABS
5.0000 mg | ORAL_TABLET | ORAL | Status: DC | PRN
  Administered 2018-02-07 – 2018-02-09 (×7): 5 mg via ORAL
  Filled 2018-02-07 (×7): qty 1

## 2018-02-07 NOTE — Progress Notes (Signed)
1 Day Post-Op   Subjective/Chief Complaint: She feels better today. No nausea or pain but hasn't eaten yet. Passing flatus. Wants to stay in hospital another day.    Objective: Vital signs in last 24 hours: Temp:  [97.5 F (36.4 C)-98.8 F (37.1 C)] 98.7 F (37.1 C) (08/23 0413) Pulse Rate:  [66-83] 80 (08/23 0413) Resp:  [13-19] 18 (08/23 0413) BP: (149-166)/(59-86) 151/63 (08/23 0413) SpO2:  [95 %-100 %] 96 % (08/23 0413) Last BM Date: 02/05/18  Intake/Output from previous day: 08/22 0701 - 08/23 0700 In: 1327.5 [I.V.:1227.5; IV Piggyback:100] Out: 355 [Urine:330; Blood:25] Intake/Output this shift: No intake/output data recorded.  General appearance: alert and cooperative Resp: unlabored GI: soft, nondistended, not tender. incisions c/d/i with dermabond Skin: Skin color, texture, turgor normal. No rashes or lesions Incision/Wound: no infection  Lab Results:  Recent Labs    02/05/18 0423 01/22/2018 0422  WBC 13.0* 17.5*  HGB 9.5* 11.1*  HCT 31.1* 36.5  PLT 309 409*   BMET Recent Labs    02/05/18 0423 01/24/2018 0422  NA 140 139  K 4.9 4.4  CL 105 102  CO2 27 25  GLUCOSE 125* 94  BUN 18 15  CREATININE 1.45* 1.42*  CALCIUM 8.3* 8.9   PT/INR No results for input(s): LABPROT, INR in the last 72 hours. ABG No results for input(s): PHART, HCO3 in the last 72 hours.  Invalid input(s): PCO2, PO2  Studies/Results: No results found.  Anti-infectives: Anti-infectives (From admission, onward)   Start     Dose/Rate Route Frequency Ordered Stop   01/30/2018 0600  ceFAZolin (ANCEF) IVPB 2g/100 mL premix     2 g 200 mL/hr over 30 Minutes Intravenous 30 min pre-op 02/05/18 1524 01/20/2018 0905   02/05/18 1200  cephALEXin (KEFLEX) capsule 500 mg     500 mg Oral Every 12 hours 02/05/18 1055 02/04/2018 0959      Assessment/Plan: s/p Procedure(s): LAPAROSCOPIC CHOLECYSTECTOMY (N/A) She will try a regular diet this morning. She does not want to go home until tomorrow  as she lives alone. That is fine from surgery standpoint.   LOS: 2 days    Clovis Riley 02/07/2018

## 2018-02-07 NOTE — Care Management Note (Signed)
Case Management Note  Patient Details  Name: [name] MRN: [id number] Date of Birth: 03-12-1929  Subjective/Objective: Pt admitted with UTI                   Action/Plan:  Plan to discharge home with no needs at present time.    Expected Discharge Date:                  Expected Discharge Plan:  Home/Self Care  In-House Referral:     Discharge planning Services  CM Consult  Post Acute Care Choice:    Choice offered to:     DME Arranged:    DME Agency:     HH Arranged:    [place] Agency:     Status of Service:  Completed, signed off  If discussed at [employer] of Stay Meetings, dates discussed:    Additional CommentsPurcell Mouton, RN 02/07/2018, 10:55 AM

## 2018-02-07 NOTE — Progress Notes (Signed)
Patient Demographics:    [name], is a 84 y.o. female, DOB - 05/21/1929, LEX:[id number]  Admit date - 01/16/2018   Admitting Physician Jani Gravel, MD  Outpatient Primary MD for the patient is Hulan Fess, MD  LOS - 2   Chief Complaint  Patient presents with  . Abdominal Pain        Subjective:    [name] today has no fevers,    No chest pain,  C/o increased abd pain, son at bedside, Tramadol is not effective  Assessment  & Plan :    Active Problems:   Type 2 diabetes mellitus with complication (HCC)   Abdominal pain   UTI (urinary tract infection)   Nausea & vomiting   Diarrhea   Nausea and vomiting   Brief Summary 84 y.o.female,w/ hypertension, CAD s/p CABG, polymyalgia rheumatica , celiac disease, recent right ureteral stent placement 11/2017, with recurrent right upper quadrant abdominal pain associated with nausea, diarrhea since early June 2019, patient has had GI work-up including s/p EGD 01/24/2018=>widely patent and non-obstructing schatzki ring, acute gastritis, and small hiatal hernia, Colonoscopy=>diverticulosis and internal hemorrhoids admitted on 02/02/2018 with intractable emesis and diarrhea with abdominal pain s/p lap chole on 01/18/2018   Plan:- 1)Abd Pain-----  recurrent abd pain/dairrhea, abdominal pain is mostly in the right upper quadrant, recent gallbladder ultrasound shows sludge, HIDA scan with EF of 27%, discussed with [facility] , Dr Penelope Coop, [facility] consult appreciated, surgical consult appreciated patient is status post lap chole on 02/11/2018, further postop management per surgical team.. Continue Colestid, patient complains of increased abdominal pain, stop tramadol, give oxycodone as needed, IV Dilaudid as ordered  2)HTN--stable, continue clonidine 0.1 mg twice daily, continue carvedilol, amlodipine 10 mg daily, may use PRN labetalol if BP remains elevated  3)  hypothyroidism--continue levothyroxine 50 mcg daily  Code Status : Full   Disposition Plan  : Home possibly on 02/08/2018, if pain control is better and if oral intake is better  Consults  :  Gi/Surgery  DVT Prophylaxis  :  Lovenox   Lab Results  Component Value Date   PLT 409 (H) 02/15/2018    Inpatient Medications  Scheduled Meds: . amLODipine  10 mg Oral Daily  . aspirin EC  81 mg Oral Q1500  . azelastine  1 spray Each Nare BID  . carvedilol  1.5625 mg Oral BID WC  . cephALEXin  500 mg Oral Q12H  . cholecalciferol  4,000 Units Oral Q1500  . cloNIDine  0.1 mg Oral BID  . colestipol  2 g Oral Daily  . enoxaparin (LOVENOX) injection  30 mg Subcutaneous Q24H  . ferrous sulfate  325 mg Oral BID PC  . levothyroxine  50 mcg Oral QAC breakfast  . multivitamin with minerals  1 tablet Oral Q1500  . pantoprazole  40 mg Oral QAC breakfast  . potassium chloride  10 mEq Oral Daily  . predniSONE  10 mg Oral Q breakfast  . pyridOXINE  100 mg Oral Weekly  . vitamin B-12  500 mcg Oral Weekly  . vitamin C  500 mg Oral Daily   Continuous Infusions:  PRN Meds:.acetaminophen **OR** acetaminophen, HYDROmorphone (DILAUDID) injection, labetalol, ondansetron (ZOFRAN) IV, ondansetron, oxyCODONE    Anti-infectives (From admission, onward)  Start     Dose/Rate Route Frequency Ordered Stop   01/24/2018 0600  ceFAZolin (ANCEF) IVPB 2g/100 mL premix     2 g 200 mL/hr over 30 Minutes Intravenous 30 min pre-op 02/05/18 1524 01/31/2018 0905   02/05/18 1200  cephALEXin (KEFLEX) capsule 500 mg     500 mg Oral Every 12 hours 02/05/18 1055 02/11/2018 0959        Objective:   Vitals:   02/05/2018 1321 01/17/2018 2033 02/07/18 0413 02/07/18 1319  BP: (!) 161/69 (!) 149/68 (!) 151/63 (!) 147/58  Pulse: 72 83 80 82  Resp: 16 18 18 14   Temp: 97.7 F (36.5 C) 98.8 F (37.1 C) 98.7 F (37.1 C) 98.5 F (36.9 C)  TempSrc: Oral Oral  Oral  SpO2: 96% 97% 96% 95%  Weight:      Height:        Wt Readings  from Last 3 Encounters:  02/08/2018 77 kg  01/25/18 80.5 kg  01/08/18 80 kg     Intake/Output Summary (Last 24 hours) at 02/07/2018 1707 Last data filed at 02/07/2018 1609 Gross per 24 hour  Intake 240 ml  Output 1330 ml  Net -1090 ml     Physical Exam  Gen:- Awake Alert,  In no apparent distress  HEENT:- [place].AT, No sclera icterus Neck-Supple Neck,No JVD,.  Lungs-  CTAB , fairly symmetrical air movement CV- S1, S2 normal Abd-  +ve B.Sounds, Abd Soft, appropriate postop tenderness, , postop wound/incisions looks clean dry and intact extremity/Skin:- No  edema,   good pulses Psych-affect is appropriate, oriented x3 Neuro-no new focal deficits, no tremors   Data Review:   Micro Results Recent Results (from the past 240 hour(s))  Urine culture     Status: Abnormal   Collection Time: 02/05/2018  1:20 AM  Result Value Ref Range Status   Specimen Description   Final    URINE, RANDOM Performed at [facility] [address], [place], [place] [address]    Special Requests   Final    NONE Performed at [facility], [place] [address] [place], [place] [address]    Culture (A)  Final    >=100,000 COLONIES/mL GROUP B STREP(S.AGALACTIAE)ISOLATED TESTING AGAINST S. AGALACTIAE NOT ROUTINELY PERFORMED DUE TO PREDICTABILITY OF AMP/PEN/VAN SUSCEPTIBILITY. Performed at [facility], [place] [address], [place], [place] [address]    Report Status 02/05/2018 FINAL  Final  Gastrointestinal Panel by PCR , Stool     Status: None   Collection Time: 01/22/2018  8:00 PM  Result Value Ref Range Status   Campylobacter species NOT DETECTED NOT DETECTED Final   Plesimonas shigelloides NOT DETECTED NOT DETECTED Final   Salmonella species NOT DETECTED NOT DETECTED Final   Yersinia enterocolitica NOT DETECTED NOT DETECTED Final   Vibrio species NOT DETECTED NOT DETECTED Final   Vibrio cholerae NOT DETECTED NOT DETECTED Final   Enteroaggregative E coli (EAEC) NOT  DETECTED NOT DETECTED Final   Enteropathogenic E coli (EPEC) NOT DETECTED NOT DETECTED Final   Enterotoxigenic E coli (ETEC) NOT DETECTED NOT DETECTED Final   Shiga like toxin producing E coli (STEC) NOT DETECTED NOT DETECTED Final   Shigella/Enteroinvasive E coli (EIEC) NOT DETECTED NOT DETECTED Final   Cryptosporidium NOT DETECTED NOT DETECTED Final   Cyclospora cayetanensis NOT DETECTED NOT DETECTED Final   Entamoeba histolytica NOT DETECTED NOT DETECTED Final   Giardia lamblia NOT DETECTED NOT DETECTED Final   Adenovirus F40/41 NOT DETECTED NOT DETECTED Final  Astrovirus NOT DETECTED NOT DETECTED Final   Norovirus GI/GII NOT DETECTED NOT DETECTED Final   Rotavirus A NOT DETECTED NOT DETECTED Final   Sapovirus (I, II, IV, and V) NOT DETECTED NOT DETECTED Final    Comment: Performed at [facility], [address], [place], [place] [address]    Radiology Reports Ct Abdomen Pelvis Wo Contrast  Result Date: 01/21/2018 CLINICAL DATA:  Abdominal pain with nausea and vomiting, history of recent stent placement EXAM: CT ABDOMEN AND PELVIS WITHOUT CONTRAST TECHNIQUE: Multidetector CT imaging of the abdomen and pelvis was performed following the standard protocol without IV contrast. COMPARISON:  12/25/2017 FINDINGS: Lower chest: No acute abnormality. Hepatobiliary: No focal liver abnormality is seen. No gallstones, gallbladder Qu thickening, or biliary dilatation. Pancreas: Unremarkable. No pancreatic ductal dilatation or surrounding inflammatory changes. Spleen: Normal in size without focal abnormality. Adrenals/Urinary Tract: Kidneys are well visualized bilaterally. Diffuse cortical thinning is noted on the left similar to that seen on the prior exam. Scattered renal cystic changes are noted. No definitive renal calculi are seen. Fullness of the right renal collecting system and proximal right ureter are again identified. A right ureteral stent is noted in satisfactory position. The  bladder is well distended. Stomach/Bowel: Diverticular change of the colon is noted without evidence of diverticulitis. The appendix is within normal limits. No obstructive or inflammatory changes are seen. Vascular/Lymphatic: Aortic atherosclerosis. No enlarged abdominal or pelvic lymph nodes. A few small retroperitoneal lymph nodes are identified not significant by size criteria. These are stable from the prior exam. Reproductive: Status post hysterectomy. Other: No abdominal Standre hernia or abnormality. No abdominopelvic ascites. Musculoskeletal: Degenerative changes of the lumbar spine are seen. No acute bony abnormality is noted. IMPRESSION: Right ureteral stent in satisfactory position. There remains some fullness of the right renal collecting system and proximal right ureter likely related to reflux from the bladder. The known ureteral lesion is not well appreciated. Chronic changes as described above. Electronically Signed   By: Inez Catalina M.D.   On: 01/21/2018 07:51   Dg Chest 2 View  Result Date: 01/21/2018 CLINICAL DATA:  Pain and nausea.  Hypertension. EXAM: CHEST - 2 VIEW COMPARISON:  January 25, 2015 FINDINGS: There is no edema or consolidation. Heart is upper normal in size with pulmonary vascularity normal. No adenopathy. Patient is status post coronary artery bypass grafting. There is aortic atherosclerosis. There is thoracic dextroscoliosis. There is degenerative change throughout the thoracic spine. IMPRESSION: No edema or consolidation. Heart size within normal limits. Status post coronary artery bypass grafting. There is aortic atherosclerosis. Aortic Atherosclerosis (ICD10-I70.0). Electronically Signed   By: Lowella Grip III M.D.   On: 01/21/2018 09:14   Nm Hepato W/eject Fract  Result Date: 02/11/2018 CLINICAL DATA:  Upper abdominal pain with nausea and vomiting EXAM: NUCLEAR MEDICINE HEPATOBILIARY IMAGING WITH GALLBLADDER EF TECHNIQUE: Sequential images of the abdomen were obtained  out to 60 minutes following intravenous administration of radiopharmaceutical. After oral ingestion of Ensure, gallbladder ejection fraction was determined. At 60 min, normal ejection fraction is greater than 33%. RADIOPHARMACEUTICALS:  5.1 mCi Tc-74m  Choletec IV COMPARISON:  None. FINDINGS: Prompt uptake and biliary excretion of activity by the liver is seen. Gallbladder activity is visualized, consistent with patency of cystic duct. Biliary activity passes into small bowel, consistent with patent common bile duct. Calculated gallbladder ejection fraction is 27%. (Normal gallbladder ejection fraction with Ensure is greater than 33%.) IMPRESSION: Normal uptake and excretion of biliary tracer. Decreased gallbladder ejection fraction of  27%. Electronically Signed   By: Inez Catalina M.D.   On: 02/08/2018 13:55     CBC Recent Labs  Lab 02/03/18 2139 02/05/18 0423 01/28/2018 0422  WBC 16.9* 13.0* 17.5*  HGB 11.2* 9.5* 11.1*  HCT 35.6* 31.1* 36.5  PLT 392 309 409*  MCV 77.7* 78.9 79.7  MCH 24.5* 24.1* 24.2*  MCHC 31.5 30.5 30.4  RDW 18.2* 17.9* 18.2*    Chemistries  Recent Labs  Lab 02/03/18 2139 02/05/18 0423 02/08/2018 0422  NA 139 140 139  K 3.8 4.9 4.4  CL 98 105 102  CO2 26 27 25   GLUCOSE 155* 125* 94  BUN 25* 18 15  CREATININE 1.86* 1.45* 1.42*  CALCIUM 8.9 8.3* 8.9  AST 22 16 19   ALT 17 14 14   ALKPHOS 89 67 86  BILITOT 0.8 0.8 1.0   ------------------------------------------------------------------------------------------------------------------ No results for input(s): CHOL, HDL, LDLCALC, TRIG, CHOLHDL, LDLDIRECT in the last 72 hours.  Lab Results  Component Value Date   HGBA1C 5.9 (H) 01/21/2018   ------------------------------------------------------------------------------------------------------------------ No results for input(s): TSH, T4TOTAL, T3FREE, THYROIDAB in the last 72 hours.  Invalid input(s):  FREET3 ------------------------------------------------------------------------------------------------------------------ No results for input(s): VITAMINB12, FOLATE, FERRITIN, TIBC, IRON, RETICCTPCT in the last 72 hours.  Coagulation profile No results for input(s): INR, PROTIME in the last 168 hours.  No results for input(s): DDIMER in the last 72 hours.  Cardiac Enzymes No results for input(s): CKMB, TROPONINI, MYOGLOBIN in the last 168 hours.  Invalid input(s): CK ------------------------------------------------------------------------------------------------------------------ No results found for: BNP   Roxan Hockey M.D on 02/07/2018 at 5:07 PM   Go to [email or web address] - password [email or web address] for contact info  [facility] - Office  [phone]

## 2018-02-08 ENCOUNTER — Inpatient Hospital Stay (HOSPITAL_COMMUNITY): Payer: Medicare Other

## 2018-02-08 LAB — BASIC METABOLIC PANEL
ANION GAP: 7 (ref 5–15)
BUN: 17 mg/dL (ref 8–23)
CALCIUM: 8.5 mg/dL — AB (ref 8.9–10.3)
CO2: 26 mmol/L (ref 22–32)
Chloride: 104 mmol/L (ref 98–111)
Creatinine, Ser: 1.34 mg/dL — ABNORMAL HIGH (ref 0.44–1.00)
GFR, EST AFRICAN AMERICAN: 40 mL/min — AB (ref >60)
GFR, EST NON AFRICAN AMERICAN: 35 mL/min — AB (ref >60)
Glucose, Bld: 113 mg/dL — ABNORMAL HIGH (ref 70–99)
Potassium: 4.4 mmol/L (ref 3.5–5.1)
Sodium: 137 mmol/L (ref 135–145)

## 2018-02-08 LAB — CBC WITH DIFFERENTIAL/PLATELET
BASOS ABS: 0.1 10*3/uL (ref 0.0–0.1)
BASOS PCT: 1 %
Eosinophils Absolute: 0 10*3/uL (ref 0.0–0.7)
Eosinophils Relative: 0 %
HCT: 37.2 % (ref 36.0–46.0)
Hemoglobin: 11.2 g/dL — ABNORMAL LOW (ref 12.0–15.0)
Lymphocytes Relative: 44 %
Lymphs Abs: 8 10*3/uL — ABNORMAL HIGH (ref 0.7–4.0)
MCH: 24 pg — ABNORMAL LOW (ref 26.0–34.0)
MCHC: 30.1 g/dL (ref 30.0–36.0)
MCV: 79.8 fL (ref 78.0–100.0)
Monocytes Absolute: 0.6 10*3/uL (ref 0.1–1.0)
Monocytes Relative: 3 %
NEUTROS ABS: 9.5 10*3/uL — AB (ref 1.7–7.7)
NEUTROS PCT: 52 %
PLATELETS: 462 10*3/uL — AB (ref 150–400)
RBC: 4.66 MIL/uL (ref 3.87–5.11)
RDW: 18.3 % — ABNORMAL HIGH (ref 11.5–15.5)
WBC: 18.1 10*3/uL — ABNORMAL HIGH (ref 4.0–10.5)

## 2018-02-08 LAB — CBC
HEMATOCRIT: 31.3 % — AB (ref 36.0–46.0)
Hemoglobin: 9.5 g/dL — ABNORMAL LOW (ref 12.0–15.0)
MCH: 24.5 pg — ABNORMAL LOW (ref 26.0–34.0)
MCHC: 30.4 g/dL (ref 30.0–36.0)
MCV: 80.7 fL (ref 78.0–100.0)
PLATELETS: 341 10*3/uL (ref 150–400)
RBC: 3.88 MIL/uL (ref 3.87–5.11)
RDW: 18.1 % — AB (ref 11.5–15.5)
WBC: 14.9 10*3/uL — ABNORMAL HIGH (ref 4.0–10.5)

## 2018-02-08 LAB — URINALYSIS, ROUTINE W REFLEX MICROSCOPIC
Bilirubin Urine: NEGATIVE
Glucose, UA: NEGATIVE mg/dL
KETONES UR: NEGATIVE mg/dL
Nitrite: NEGATIVE
PH: 6.5 (ref 5.0–8.0)
Protein, ur: 100 mg/dL — AB
Specific Gravity, Urine: 1.02 (ref 1.005–1.030)

## 2018-02-08 LAB — URINALYSIS, MICROSCOPIC (REFLEX): RBC / HPF: >50 RBC/hpf (ref 0–5)

## 2018-02-08 MED ORDER — IOPAMIDOL (ISOVUE-300) INJECTION 61%
INTRAVENOUS | Status: AC
  Administered 2018-02-08: 80 mL
  Filled 2018-02-08: qty 100

## 2018-02-08 MED ORDER — KETOROLAC TROMETHAMINE 15 MG/ML IJ SOLN
15.0000 mg | Freq: Once | INTRAMUSCULAR | Status: AC
  Administered 2018-02-08: 15 mg via INTRAVENOUS

## 2018-02-08 MED ORDER — MAGNESIUM HYDROXIDE 400 MG/5ML PO SUSP
30.0000 mL | Freq: Once | ORAL | Status: AC
  Administered 2018-02-08: 30 mL via ORAL
  Filled 2018-02-08: qty 30

## 2018-02-08 MED ORDER — HYDROMORPHONE HCL 1 MG/ML IJ SOLN
1.0000 mg | Freq: Once | INTRAMUSCULAR | Status: AC
  Administered 2018-02-08: 1 mg via INTRAVENOUS
  Filled 2018-02-08: qty 1

## 2018-02-08 MED ORDER — BISACODYL 10 MG RE SUPP
10.0000 mg | Freq: Once | RECTAL | Status: AC
  Administered 2018-02-08: 10 mg via RECTAL
  Filled 2018-02-08: qty 1

## 2018-02-08 MED ORDER — ONDANSETRON HCL 4 MG/2ML IJ SOLN
4.0000 mg | Freq: Once | INTRAMUSCULAR | Status: AC
  Administered 2018-02-08: 4 mg via INTRAVENOUS
  Filled 2018-02-08: qty 2

## 2018-02-08 MED ORDER — DEXTROSE-NACL 5-0.45 % IV SOLN
INTRAVENOUS | Status: DC
  Administered 2018-02-08: 18:00:00 via INTRAVENOUS

## 2018-02-08 MED ORDER — FLEET ENEMA 7-19 GM/118ML RE ENEM
1.0000 | ENEMA | Freq: Once | RECTAL | Status: AC
  Administered 2018-02-08: 1 via RECTAL

## 2018-02-08 MED ORDER — LACTULOSE 10 GM/15ML PO SOLN
30.0000 g | Freq: Once | ORAL | Status: AC
  Administered 2018-02-08: 30 g via ORAL
  Filled 2018-02-08: qty 60

## 2018-02-08 MED ORDER — SODIUM CHLORIDE 0.9 % IV SOLN
1.0000 g | INTRAVENOUS | Status: DC
  Administered 2018-02-08: 1 g via INTRAVENOUS
  Filled 2018-02-08: qty 1

## 2018-02-08 MED ORDER — KETOROLAC TROMETHAMINE 30 MG/ML IJ SOLN
INTRAMUSCULAR | Status: AC
  Filled 2018-02-08: qty 1

## 2018-02-08 MED ORDER — SODIUM CHLORIDE 0.9 % IV SOLN
INTRAVENOUS | Status: DC
  Administered 2018-02-08: 12:00:00 via INTRAVENOUS

## 2018-02-08 MED ORDER — HYDROMORPHONE HCL 1 MG/ML IJ SOLN
1.0000 mg | INTRAMUSCULAR | Status: DC | PRN
  Administered 2018-02-08 – 2018-02-09 (×3): 1 mg via INTRAVENOUS
  Filled 2018-02-08 (×3): qty 1

## 2018-02-08 MED ORDER — HYDROMORPHONE HCL 1 MG/ML IJ SOLN
1.0000 mg | INTRAMUSCULAR | Status: DC | PRN
  Administered 2018-02-08: 1 mg via INTRAVENOUS
  Filled 2018-02-08: qty 1

## 2018-02-08 NOTE — Progress Notes (Addendum)
  Called to reevaluate patient due to concerns about worsening abdominal pain, chills but no fevers, patient with hematuria, labs and CT abdomen and pelvis from earlier today reviewed again with family members specifically son and daughter at bedside  Abdominal exam patient has tenderness in the right upper and right lower quadrants, no significant guarding, no rebound , no CVA area tenderness, I do not appreciate further significant distention at this time on my reexamination around 7 PM  UA suspicious for UTI in the setting of hematuria in the patient with prior history of nephrolithiasis and Rt ureteral stent---???? stone, given hematuria right sided abdominal pain in the setting of negative Contrast CT abdomen and pelvis today ,  WBC up to 18 k from 14.9-------- d/w Dr Lucia Gaskins , will stop keflex, start IV Rocephin , send blood and urine cultures.  Due to age and Renal  limitations give Toradol 15 mg IV x1 only  Given hematuria in the patient with right ureteral stent , I called and Discussed with Dr Junious Silk the on-call urologist, he plans to see pt in am    Please see full progress note from earlier today  Roxan Hockey, MD

## 2018-02-08 NOTE — Progress Notes (Signed)
Pt called RN to room complaining of severe abdominal pain. MD was made aware and suppository and milk of magnesium ordered. Pt continued to complain of 9/10 pain with no relief from diluadid or oxycodone. Surgery was paged and informed of pt condition. CT abdomen ordered and currently awaiting for pt to drink contrast.

## 2018-02-08 NOTE — Progress Notes (Addendum)
[facility] Office:  [phone] General Surgery Progress Note   LOS: 3 days  POD -  2 Days Post-Op  Chief Complaint: Abdominal pain  Assessment and Plan: 1.  LAPAROSCOPIC CHOLECYSTECTOMY - 02/02/2018 [name]  For biliary dyskinesia  WBC - 14,900 - 02/08/2018  On Keflex  Looks good.  Okay to go home.  No need for antibiotics from GB standpoint.  Follow up with Dr. Kae Bryant in 2 to 3 weeks.   2. Anemia  Hgb - 9.5 - 02/08/2018 3.  DVT prophylaxis - On lovenox 4.  On prednisone for polymyalgia rheumatica - followed by Dr. Trudie Bryant 5.  HTN 7.  Right ureteral double J stent placed by Dr. Diona Bryant on 12/14/2017 for distal ureteral tumor with hydronephrosis 6.  She developed severe right sided abdominal pain after rounds  It came on fairly suddenly.  No peritoneal sxes  Daughter, Sarajane Jews, and son Shanon Brow, at bedside.  Labs pending.  For CT scan of abdomen - pending.   Active Problems:   Type 2 diabetes mellitus with complication (HCC)   Abdominal pain   UTI (urinary tract infection)   Nausea & vomiting   Diarrhea   Nausea and vomiting   Subjective:  Doing okay.  She was not ready to go home yesterday, but ready to go home today.  Objective:   Vitals:   02/08/18 0434 02/08/18 0503  BP: (!) 161/69 (!) 158/64  Pulse: 69   Resp: 12   Temp: 97.9 F (36.6 C)   SpO2: 93%      Intake/Output from previous day:  08/23 0701 - 08/24 0700 In: 360 [P.O.:360] Out: 1850 [Urine:1850]  Intake/Output this shift:  No intake/output data recorded.   Physical Exam:   General: Older WF who is alert and oriented.    HEENT: Normal. Pupils equal. .   Lungs: Clear   Abdomen: Soft   Wound: Clean   Lab Results:    Recent Labs    02/15/2018 0422 02/08/18 0417  WBC 17.5* 14.9*  HGB 11.1* 9.5*  HCT 36.5 31.3*  PLT 409* 341    BMET   Recent Labs    02/15/2018 0422 02/08/18 0417  NA 139 137  K 4.4 4.4  CL 102 104  CO2 25 26  GLUCOSE 94 113*  BUN 15 17   CREATININE 1.42* 1.34*  CALCIUM 8.9 8.5*    PT/INR  No results for input(s): LABPROT, INR in the last 72 hours.  ABG  No results for input(s): PHART, HCO3 in the last 72 hours.  Invalid input(s): PCO2, PO2   Studies/Results:  No results found.   Anti-infectives:   Anti-infectives (From admission, onward)   Start     Dose/Rate Route Frequency Ordered Stop   02/05/2018 0600  ceFAZolin (ANCEF) IVPB 2g/100 mL premix     2 g 200 mL/hr over 30 Minutes Intravenous 30 min pre-op 02/05/18 1524 02/03/2018 0905   02/05/18 1200  cephALEXin (KEFLEX) capsule 500 mg     500 mg Oral Every 12 hours 02/05/18 1055 02/06/2018 0959      Alphonsa Overall, MD, FACS Pager: [facility] Office: [phone] 02/08/2018

## 2018-02-08 NOTE — Progress Notes (Addendum)
Patient Demographics:    [name], is a 84 y.o. female, DOB - 02-08-1929, JIR:[id number]  Admit date - 01/19/2018   Admitting Physician Jani Gravel, MD  Outpatient Primary MD for the patient is Hulan Fess, MD  LOS - 3   Chief Complaint  Patient presents with  . Abdominal Pain        Subjective:    [name] today has no fevers,    No chest pain,  C/o RLQ abd pain,  Requiring more iv dilaudid, d./w pt's son by phone  Assessment  & Plan :    Active Problems:   Type 2 diabetes mellitus with complication (HCC)   Abdominal pain   UTI (urinary tract infection)   Nausea & vomiting   Diarrhea   Nausea and vomiting   Brief Summary 84 y.o.female,w/ hypertension, CAD s/p CABG, polymyalgia rheumatica , celiac disease, recent right ureteral stent placement 11/2017, with recurrent right upper quadrant abdominal pain associated with nausea, diarrhea since early June 2019, patient has had GI work-up including s/p EGD 01/24/2018=>widely patent and non-obstructing schatzki ring, acute gastritis, and small hiatal hernia, Colonoscopy=>diverticulosis and internal hemorrhoids admitted on 02/12/2018 with intractable emesis and diarrhea with abdominal pain s/p lap chole on 02/05/2018, on 02/08/2018 patient developed severe abdominal pain in the right lower quadrant, repeat CT abdomen and pelvis on 02/08/2018 with contrast without acute findings   Plan:- 1)Abd Pain-----  recurrent abd pain/dairrhea, recent gallbladder ultrasound shows sludge, HIDA scan with EF of 27%, discussed with [facility] , Dr Penelope Coop, [facility] consult appreciated, surgical consult appreciated, patient is status post lap chole on 02/14/2018, further postop management per surgical team..  patient complains of increased abdominal pain, on 02/08/2018 patient developed severe abdominal pain in the right lower quadrant, repeat CT abdomen and pelvis on 02/08/2018 with  contrast without acute findings give oxycodone as needed, IV Dilaudid as ordered, No BM, Discussed with on-call general surgeon Dr. Lucia Gaskins, white count is down to 14,000 from 17,000, [patient is on steroids chronically]  2)HTN--stable, continue clonidine 0.1 mg twice daily, continue carvedilol, amlodipine 10 mg daily, may use PRN labetalol if BP remains elevated  3)Hypothyroidism--continue levothyroxine 50 mcg daily  4)AKI--- acute kidney injury, creatinine improved to 1.34 from 1.42, maintain adequate hydration, especially given contrast study on 02/08/2018, repeat BMP in a.m.  5) acute anemia-hemoglobin is down to 9.5 from 11.1, suspect some postop blood loss and hemodilution, no evidence of ongoing bleeding  Code Status : Full   Disposition Plan  : Hold discharge home until pain control is better and if oral intake is better  Consults  :  Gi/Surgery  DVT Prophylaxis  :  SCD (hematuria 02/08/18)  Lab Results  Component Value Date   PLT 341 02/08/2018    Inpatient Medications  Scheduled Meds: . amLODipine  10 mg Oral Daily  . aspirin EC  81 mg Oral Q1500  . azelastine  1 spray Each Nare BID  . carvedilol  1.5625 mg Oral BID WC  . cephALEXin  500 mg Oral Q12H  . cholecalciferol  4,000 Units Oral Q1500  . cloNIDine  0.1 mg Oral BID  . colestipol  2 g Oral Daily  . ferrous sulfate  325 mg Oral BID PC  .  levothyroxine  50 mcg Oral QAC breakfast  . multivitamin with minerals  1 tablet Oral Q1500  . pantoprazole  40 mg Oral QAC breakfast  . potassium chloride  10 mEq Oral Daily  . predniSONE  10 mg Oral Q breakfast  . pyridOXINE  100 mg Oral Weekly  . sodium phosphate  1 enema Rectal Once  . vitamin B-12  500 mcg Oral Weekly  . vitamin C  500 mg Oral Daily   Continuous Infusions: . sodium chloride 50 mL/hr at 02/08/18 1510   PRN Meds:.acetaminophen **OR** acetaminophen, HYDROmorphone (DILAUDID) injection, labetalol, ondansetron (ZOFRAN) IV, ondansetron,  oxyCODONE    Anti-infectives (From admission, onward)   Start     Dose/Rate Route Frequency Ordered Stop   01/26/2018 0600  ceFAZolin (ANCEF) IVPB 2g/100 mL premix     2 g 200 mL/hr over 30 Minutes Intravenous 30 min pre-op 02/05/18 1524 02/14/2018 0905   02/05/18 1200  cephALEXin (KEFLEX) capsule 500 mg     500 mg Oral Every 12 hours 02/05/18 1055 02/15/2018 0959        Objective:   Vitals:   02/08/18 0434 02/08/18 0434 02/08/18 0503 02/08/18 1417  BP:  (!) 161/69 (!) 158/64 (!) 161/66  Pulse:  69  88  Resp:  12  16  Temp:  97.9 F (36.6 C)  98.8 F (37.1 C)  TempSrc:  Oral  Oral  SpO2:  93%  94%  Weight: 79.2 kg     Height:        Wt Readings from Last 3 Encounters:  02/08/18 79.2 kg  01/25/18 80.5 kg  01/08/18 80 kg     Intake/Output Summary (Last 24 hours) at 02/08/2018 1700 Last data filed at 02/08/2018 1510 Gross per 24 hour  Intake 360.7 ml  Output 1300 ml  Net -939.3 ml     Physical Exam Gen:- Awake Alert, very uncomfortable with pain  HEENT:- [place].AT, No sclera icterus Neck-Supple Neck,No JVD,.  Lungs-  CTAB , fairly symmetrical air movement CV- S1, S2 normal Abd-  +ve B.Sounds, abdomen becoming more distended with significant right lower quadrant tenderness with voluntary guarding, postop wound/incisions looks clean dry and intact   Extremity/Skin:- No  edema,   good pulses Psych-affect is appropriate, oriented x3 Neuro-no new focal deficits, no tremors   Data Review:   Micro Results Recent Results (from the past 240 hour(s))  Urine culture     Status: Abnormal   Collection Time: 02/06/2018  1:20 AM  Result Value Ref Range Status   Specimen Description   Final    URINE, RANDOM Performed at [facility] [address], [place], [place] [address]    Special Requests   Final    NONE Performed at [facility], [place] [address], [place], [place] [address]    Culture (A)  Final    >=100,000 COLONIES/mL GROUP B  STREP(S.AGALACTIAE)ISOLATED TESTING AGAINST S. AGALACTIAE NOT ROUTINELY PERFORMED DUE TO PREDICTABILITY OF AMP/PEN/VAN SUSCEPTIBILITY. Performed at [facility], [place] [address], [place], [place] [address]    Report Status 02/05/2018 FINAL  Final  Gastrointestinal Panel by PCR , Stool     Status: None   Collection Time: 01/19/2018  8:00 PM  Result Value Ref Range Status   Campylobacter species NOT DETECTED NOT DETECTED Final   Plesimonas shigelloides NOT DETECTED NOT DETECTED Final   Salmonella species NOT DETECTED NOT DETECTED Final   Yersinia enterocolitica NOT DETECTED NOT DETECTED Final   Vibrio species  NOT DETECTED NOT DETECTED Final   Vibrio cholerae NOT DETECTED NOT DETECTED Final   Enteroaggregative E coli (EAEC) NOT DETECTED NOT DETECTED Final   Enteropathogenic E coli (EPEC) NOT DETECTED NOT DETECTED Final   Enterotoxigenic E coli (ETEC) NOT DETECTED NOT DETECTED Final   Shiga like toxin producing E coli (STEC) NOT DETECTED NOT DETECTED Final   Shigella/Enteroinvasive E coli (EIEC) NOT DETECTED NOT DETECTED Final   Cryptosporidium NOT DETECTED NOT DETECTED Final   Cyclospora cayetanensis NOT DETECTED NOT DETECTED Final   Entamoeba histolytica NOT DETECTED NOT DETECTED Final   Giardia lamblia NOT DETECTED NOT DETECTED Final   Adenovirus F40/41 NOT DETECTED NOT DETECTED Final   Astrovirus NOT DETECTED NOT DETECTED Final   Norovirus GI/GII NOT DETECTED NOT DETECTED Final   Rotavirus A NOT DETECTED NOT DETECTED Final   Sapovirus (I, II, IV, and V) NOT DETECTED NOT DETECTED Final    Comment: Performed at [facility], [address] [place], [place] [address]    Radiology Reports Ct Abdomen Pelvis Wo Contrast  Result Date: 01/21/2018 CLINICAL DATA:  Abdominal pain with nausea and vomiting, history of recent stent placement EXAM: CT ABDOMEN AND PELVIS WITHOUT CONTRAST TECHNIQUE: Multidetector CT imaging of the abdomen and pelvis was performed following the standard  protocol without IV contrast. COMPARISON:  12/25/2017 FINDINGS: Lower chest: No acute abnormality. Hepatobiliary: No focal liver abnormality is seen. No gallstones, gallbladder [place] thickening, or biliary dilatation. Pancreas: Unremarkable. No pancreatic ductal dilatation or surrounding inflammatory changes. Spleen: Normal in size without focal abnormality. Adrenals/Urinary Tract: Kidneys are well visualized bilaterally. Diffuse cortical thinning is noted on the left similar to that seen on the prior exam. Scattered renal cystic changes are noted. No definitive renal calculi are seen. Fullness of the right renal collecting system and proximal right ureter are again identified. A right ureteral stent is noted in satisfactory position. The bladder is well distended. Stomach/Bowel: Diverticular change of the colon is noted without evidence of diverticulitis. The appendix is within normal limits. No obstructive or inflammatory changes are seen. Vascular/Lymphatic: Aortic atherosclerosis. No enlarged abdominal or pelvic lymph nodes. A few small retroperitoneal lymph nodes are identified not significant by size criteria. These are stable from the prior exam. Reproductive: Status post hysterectomy. Other: No abdominal Rodino hernia or abnormality. No abdominopelvic ascites. Musculoskeletal: Degenerative changes of the lumbar spine are seen. No acute bony abnormality is noted. IMPRESSION: Right ureteral stent in satisfactory position. There remains some fullness of the right renal collecting system and proximal right ureter likely related to reflux from the bladder. The known ureteral lesion is not well appreciated. Chronic changes as described above. Electronically Signed   By: Inez Catalina M.D.   On: 01/21/2018 07:51   Dg Chest 2 View  Result Date: 01/21/2018 CLINICAL DATA:  Pain and nausea.  Hypertension. EXAM: CHEST - 2 VIEW COMPARISON:  January 25, 2015 FINDINGS: There is no edema or consolidation. Heart is upper normal  in size with pulmonary vascularity normal. No adenopathy. Patient is status post coronary artery bypass grafting. There is aortic atherosclerosis. There is thoracic dextroscoliosis. There is degenerative change throughout the thoracic spine. IMPRESSION: No edema or consolidation. Heart size within normal limits. Status post coronary artery bypass grafting. There is aortic atherosclerosis. Aortic Atherosclerosis (ICD10-I70.0). Electronically Signed   By: Lowella Grip III M.D.   On: 01/21/2018 09:14   Ct Abdomen Pelvis W Contrast  Result Date: 02/08/2018 CLINICAL DATA:  Right lower quadrant abdominal pain EXAM: CT ABDOMEN  AND PELVIS WITH CONTRAST TECHNIQUE: Multidetector CT imaging of the abdomen and pelvis was performed using the standard protocol following bolus administration of intravenous contrast. CONTRAST:  60mL ISOVUE-300 IOPAMIDOL (ISOVUE-300) INJECTION 61% COMPARISON:  01/21/2018 FINDINGS: Lower chest: Trace bilateral pleural effusions. Associated lower lobe atelectasis. Hepatobiliary: Mildly nodular hepatic contour. Status post cholecystectomy. No intrahepatic or extrahepatic ductal dilatation. Pancreas: Within normal limits. Spleen: Within normal limits. Adrenals/Urinary Tract: Adrenal glands are within normal limits. Left renal cortical scarring/atrophy. Left renal cysts, measuring up to 12 mm. Right renal cysts, measuring up to 16 mm in the interpolar region. Prominent right renal collecting system with indwelling double pigtail ureteral stent. Bladder is within normal limits. Stomach/Bowel: Stomach is within normal limits. No evidence of bowel obstruction. Normal appendix (series 2/image 51). Sigmoid diverticulosis, without evidence of diverticulitis. Vascular/Lymphatic: No evidence of abdominal aortic aneurysm. Atherosclerotic calcifications of the abdominal aorta and branch vessels. Small retroperitoneal lymph nodes, including 11 mm short axis aortocaval node (series 2/image 32), unchanged.  No suspicious abdominopelvic lymphadenopathy. Reproductive: Status post hysterectomy. No adnexal masses. Other: Trace pelvic fluid. Musculoskeletal: Degenerative changes of the visualized thoracolumbar spine. Mild lumbar levoscoliosis. Stable mild superior endplate changes at L1. IMPRESSION: Right double-pigtail ureteral stent. Stable mild prominence of the right renal collecting system. Left renal scarring/atrophy. Bilateral renal cysts, measuring up to 16 mm on the right. No evidence of bowel obstruction. Normal appendix. Sigmoid diverticulosis, without evidence of diverticulitis. Additional stable ancillary findings as above. Electronically Signed   By: Julian Hy M.D.   On: 02/08/2018 15:52   Nm Hepato W/eject Fract  Result Date: 02/01/2018 CLINICAL DATA:  Upper abdominal pain with nausea and vomiting EXAM: NUCLEAR MEDICINE HEPATOBILIARY IMAGING WITH GALLBLADDER EF TECHNIQUE: Sequential images of the abdomen were obtained out to 60 minutes following intravenous administration of radiopharmaceutical. After oral ingestion of Ensure, gallbladder ejection fraction was determined. At 60 min, normal ejection fraction is greater than 33%. RADIOPHARMACEUTICALS:  5.1 mCi Tc-84m  Choletec IV COMPARISON:  None. FINDINGS: Prompt uptake and biliary excretion of activity by the liver is seen. Gallbladder activity is visualized, consistent with patency of cystic duct. Biliary activity passes into small bowel, consistent with patent common bile duct. Calculated gallbladder ejection fraction is 27%. (Normal gallbladder ejection fraction with Ensure is greater than 33%.) IMPRESSION: Normal uptake and excretion of biliary tracer. Decreased gallbladder ejection fraction of 27%. Electronically Signed   By: Inez Catalina M.D.   On: 02/11/2018 13:55     CBC Recent Labs  Lab 02/03/18 2139 02/05/18 0423 01/30/2018 0422 02/08/18 0417  WBC 16.9* 13.0* 17.5* 14.9*  HGB 11.2* 9.5* 11.1* 9.5*  HCT 35.6* 31.1* 36.5 31.3*    PLT 392 309 409* 341  MCV 77.7* 78.9 79.7 80.7  MCH 24.5* 24.1* 24.2* 24.5*  MCHC 31.5 30.5 30.4 30.4  RDW 18.2* 17.9* 18.2* 18.1*    Chemistries  Recent Labs  Lab 02/03/18 2139 02/05/18 0423 01/21/2018 0422 02/08/18 0417  NA 139 140 139 137  K 3.8 4.9 4.4 4.4  CL 98 105 102 104  CO2 26 27 25 26   GLUCOSE 155* 125* 94 113*  BUN 25* 18 15 17   CREATININE 1.86* 1.45* 1.42* 1.34*  CALCIUM 8.9 8.3* 8.9 8.5*  AST 22 16 19   --   ALT 17 14 14   --   ALKPHOS 89 67 86  --   BILITOT 0.8 0.8 1.0  --    ------------------------------------------------------------------------------------------------------------------ No results for input(s): CHOL, HDL, LDLCALC, TRIG, CHOLHDL, LDLDIRECT in the last  72 hours.  Lab Results  Component Value Date   HGBA1C 5.9 (H) 01/21/2018   ------------------------------------------------------------------------------------------------------------------ No results for input(s): TSH, T4TOTAL, T3FREE, THYROIDAB in the last 72 hours.  Invalid input(s): FREET3 ------------------------------------------------------------------------------------------------------------------ No results for input(s): VITAMINB12, FOLATE, FERRITIN, TIBC, IRON, RETICCTPCT in the last 72 hours.  Coagulation profile No results for input(s): INR, PROTIME in the last 168 hours.  No results for input(s): DDIMER in the last 72 hours.  Cardiac Enzymes No results for input(s): CKMB, TROPONINI, MYOGLOBIN in the last 168 hours.  Invalid input(s): CK ------------------------------------------------------------------------------------------------------------------ No results found for: BNP   Roxan Hockey M.D on 02/08/2018 at 5:00 PM   Go to [email or web address] - password [email or web address] for contact info  [facility] - Office  [phone]

## 2018-02-08 NOTE — Progress Notes (Signed)
Pt continues to have 10/10 pain, doubled over in pain from the abdominal pain. CT results were negative. MD was made aware of the continuing abdominal pain. Enema, lactulose and suppository were all given without any relief. Pt starting to have hematuria, urine culture and u/a were sent for analysis. MD was paged and came to room to see pt. Pt currently is resting in bed.

## 2018-02-09 ENCOUNTER — Inpatient Hospital Stay (HOSPITAL_COMMUNITY): Payer: Medicare Other

## 2018-02-09 DIAGNOSIS — R6521 Severe sepsis with septic shock: Secondary | ICD-10-CM

## 2018-02-09 DIAGNOSIS — J9601 Acute respiratory failure with hypoxia: Secondary | ICD-10-CM

## 2018-02-09 DIAGNOSIS — N182 Chronic kidney disease, stage 2 (mild): Secondary | ICD-10-CM

## 2018-02-09 DIAGNOSIS — E872 Acidosis: Secondary | ICD-10-CM

## 2018-02-09 DIAGNOSIS — R1011 Right upper quadrant pain: Secondary | ICD-10-CM

## 2018-02-09 DIAGNOSIS — A419 Sepsis, unspecified organism: Principal | ICD-10-CM

## 2018-02-09 DIAGNOSIS — R1084 Generalized abdominal pain: Secondary | ICD-10-CM

## 2018-02-09 DIAGNOSIS — N179 Acute kidney failure, unspecified: Secondary | ICD-10-CM

## 2018-02-09 DIAGNOSIS — G9340 Encephalopathy, unspecified: Secondary | ICD-10-CM

## 2018-02-09 LAB — BLOOD GAS, ARTERIAL
ACID-BASE DEFICIT: 10.6 mmol/L — AB (ref 0.0–2.0)
Bicarbonate: 14.4 mmol/L — ABNORMAL LOW (ref 20.0–28.0)
DELIVERY SYSTEMS: POSITIVE
Drawn by: 51425
EXPIRATORY PAP: 8
FIO2: 60
INSPIRATORY PAP: 16
LHR: 12 {breaths}/min
O2 Saturation: 96.1 %
PH ART: 7.275 — AB (ref 7.350–7.450)
Patient temperature: 101.4
pCO2 arterial: 32.9 mmHg (ref 32.0–48.0)
pO2, Arterial: 101 mmHg (ref 83.0–108.0)

## 2018-02-09 LAB — COMPREHENSIVE METABOLIC PANEL
ALBUMIN: 2.1 g/dL — AB (ref 3.5–5.0)
ALK PHOS: 76 U/L (ref 38–126)
ALT: 24 U/L (ref 0–44)
ANION GAP: 17 — AB (ref 5–15)
AST: 58 U/L — ABNORMAL HIGH (ref 15–41)
BUN: 23 mg/dL (ref 8–23)
CALCIUM: 7.9 mg/dL — AB (ref 8.9–10.3)
CHLORIDE: 105 mmol/L (ref 98–111)
CO2: 16 mmol/L — AB (ref 22–32)
Creatinine, Ser: 2.39 mg/dL — ABNORMAL HIGH (ref 0.44–1.00)
GFR calc Af Amer: 20 mL/min — ABNORMAL LOW (ref >60)
GFR calc non Af Amer: 17 mL/min — ABNORMAL LOW (ref >60)
GLUCOSE: 143 mg/dL — AB (ref 70–99)
POTASSIUM: 4.8 mmol/L (ref 3.5–5.1)
SODIUM: 138 mmol/L (ref 135–145)
Total Bilirubin: 0.6 mg/dL (ref 0.3–1.2)
Total Protein: 5.1 g/dL — ABNORMAL LOW (ref 6.5–8.1)

## 2018-02-09 LAB — GLUCOSE, CAPILLARY
GLUCOSE-CAPILLARY: 124 mg/dL — AB (ref 70–99)
GLUCOSE-CAPILLARY: 90 mg/dL (ref 70–99)
Glucose-Capillary: 67 mg/dL — ABNORMAL LOW (ref 70–99)
Glucose-Capillary: 105 mg/dL — ABNORMAL HIGH (ref 70–99)

## 2018-02-09 LAB — CBC
HCT: 40.9 % (ref 36.0–46.0)
Hemoglobin: 11.8 g/dL — ABNORMAL LOW (ref 12.0–15.0)
MCH: 23.9 pg — ABNORMAL LOW (ref 26.0–34.0)
MCHC: 28.9 g/dL — ABNORMAL LOW (ref 30.0–36.0)
MCV: 82.8 fL (ref 78.0–100.0)
PLATELETS: 366 10*3/uL (ref 150–400)
RBC: 4.94 MIL/uL (ref 3.87–5.11)
RDW: 18.7 % — ABNORMAL HIGH (ref 11.5–15.5)
WBC: 11.2 10*3/uL — AB (ref 4.0–10.5)

## 2018-02-09 LAB — LACTIC ACID, PLASMA
LACTIC ACID, VENOUS: 10.8 mmol/L — AB (ref 0.5–1.9)
LACTIC ACID, VENOUS: 11.9 mmol/L — AB (ref 0.5–1.9)
Lactic Acid, Venous: 10.2 mmol/L (ref 0.5–1.9)

## 2018-02-09 LAB — URINE CULTURE: Special Requests: NORMAL

## 2018-02-09 LAB — PROCALCITONIN: Procalcitonin: 40.27 ng/mL

## 2018-02-09 LAB — TROPONIN I: Troponin I: 0.05 ng/mL (ref <0.03)

## 2018-02-09 LAB — LIPASE, BLOOD: Lipase: 311 U/L — ABNORMAL HIGH (ref 11–51)

## 2018-02-09 LAB — MRSA PCR SCREENING: MRSA by PCR: NEGATIVE

## 2018-02-09 LAB — CORTISOL: CORTISOL PLASMA: 30.1 ug/dL

## 2018-02-09 MED ORDER — NOREPINEPHRINE 4 MG/250ML-% IV SOLN
0.0000 ug/min | INTRAVENOUS | Status: DC
  Administered 2018-02-09: 2 ug/min via INTRAVENOUS
  Filled 2018-02-09: qty 250

## 2018-02-09 MED ORDER — MORPHINE SULFATE (PF) 2 MG/ML IV SOLN
1.0000 mg | INTRAVENOUS | Status: DC | PRN
  Administered 2018-02-09: 1 mg via INTRAVENOUS

## 2018-02-09 MED ORDER — VANCOMYCIN VARIABLE DOSE PER UNSTABLE RENAL FUNCTION (PHARMACIST DOSING): Status: DC

## 2018-02-09 MED ORDER — SODIUM BICARBONATE 8.4 % IV SOLN
100.0000 meq | Freq: Once | INTRAVENOUS | Status: AC
  Administered 2018-02-09: 100 meq via INTRAVENOUS

## 2018-02-09 MED ORDER — VANCOMYCIN HCL 10 G IV SOLR
1750.0000 mg | Freq: Once | INTRAVENOUS | Status: AC
  Administered 2018-02-09: 1750 mg via INTRAVENOUS
  Filled 2018-02-09: qty 750

## 2018-02-09 MED ORDER — SODIUM BICARBONATE 8.4 % IV SOLN
INTRAVENOUS | Status: AC
  Filled 2018-02-09: qty 100

## 2018-02-09 MED ORDER — HYDROMORPHONE HCL 1 MG/ML IJ SOLN
0.5000 mg | Freq: Once | INTRAMUSCULAR | Status: AC
  Administered 2018-02-09: 0.5 mg via INTRAVENOUS
  Filled 2018-02-09: qty 0.5

## 2018-02-09 MED ORDER — FENTANYL CITRATE (PF) 100 MCG/2ML IJ SOLN
12.5000 ug | Freq: Once | INTRAMUSCULAR | Status: AC
  Administered 2018-02-09: 12.5 ug via INTRAVENOUS
  Filled 2018-02-09: qty 2

## 2018-02-09 MED ORDER — ACETAMINOPHEN 10 MG/ML IV SOLN
1000.0000 mg | Freq: Four times a day (QID) | INTRAVENOUS | Status: DC | PRN
  Administered 2018-02-09: 1000 mg via INTRAVENOUS
  Filled 2018-02-09: qty 100

## 2018-02-09 MED ORDER — SODIUM CHLORIDE 0.9 % IV BOLUS: 1000.0000 mL | Freq: Once | INTRAVENOUS | Status: DC

## 2018-02-09 MED ORDER — SODIUM CHLORIDE 0.9 % IV BOLUS
500.0000 mL | Freq: Once | INTRAVENOUS | Status: AC
  Administered 2018-02-09: 500 mL via INTRAVENOUS

## 2018-02-09 MED ORDER — FAMOTIDINE IN NACL 20-0.9 MG/50ML-% IV SOLN
20.0000 mg | Freq: Two times a day (BID) | INTRAVENOUS | Status: DC
  Administered 2018-02-09: 20 mg via INTRAVENOUS
  Filled 2018-02-09: qty 50

## 2018-02-09 MED ORDER — INSULIN ASPART 100 UNIT/ML ~~LOC~~ SOLN: 1.0000 [IU] | SUBCUTANEOUS | Status: DC

## 2018-02-09 MED ORDER — DEXMEDETOMIDINE HCL IN NACL 200 MCG/50ML IV SOLN
0.3000 ug/kg/h | INTRAVENOUS | Status: DC
  Administered 2018-02-09: 0.3 ug/kg/h via INTRAVENOUS
  Filled 2018-02-09: qty 50

## 2018-02-09 MED ORDER — METHYLPREDNISOLONE SODIUM SUCC 40 MG IJ SOLR
40.0000 mg | Freq: Once | INTRAMUSCULAR | Status: AC
  Administered 2018-02-09: 40 mg via INTRAVENOUS
  Filled 2018-02-09: qty 1

## 2018-02-09 MED ORDER — KETOROLAC TROMETHAMINE 15 MG/ML IJ SOLN: 15.0000 mg | Freq: Once | INTRAMUSCULAR | Status: DC

## 2018-02-09 MED ORDER — MORPHINE SULFATE (PF) 2 MG/ML IV SOLN
INTRAVENOUS | Status: AC
  Filled 2018-02-09: qty 1

## 2018-02-09 MED ORDER — STERILE WATER FOR INJECTION IV SOLN
INTRAVENOUS | Status: DC
  Administered 2018-02-09: 07:00:00 via INTRAVENOUS
  Filled 2018-02-09: qty 850

## 2018-02-09 MED ORDER — NALOXONE HCL 2 MG/2ML IJ SOSY
2.0000 mg | PREFILLED_SYRINGE | Freq: Once | INTRAMUSCULAR | Status: DC
  Filled 2018-02-09: qty 2

## 2018-02-09 MED ORDER — SODIUM CHLORIDE 0.9 % IV SOLN
1.0000 g | Freq: Two times a day (BID) | INTRAVENOUS | Status: DC
  Administered 2018-02-09: 1 g via INTRAVENOUS
  Filled 2018-02-09 (×2): qty 1

## 2018-02-09 MED ORDER — SODIUM BICARBONATE 8.4 % IV SOLN
100.0000 meq | Freq: Once | INTRAVENOUS | Status: AC
  Administered 2018-02-09: 100 meq via INTRAVENOUS
  Filled 2018-02-09: qty 100

## 2018-02-09 MED ORDER — LORAZEPAM 2 MG/ML IJ SOLN: 0.5000 mg | Freq: Once | INTRAMUSCULAR | Status: DC

## 2018-02-09 MED ORDER — NALOXONE HCL 0.4 MG/ML IJ SOLN
INTRAMUSCULAR | Status: AC
  Filled 2018-02-09: qty 1

## 2018-02-09 MED ORDER — DEXTROSE 50 % IV SOLN
25.0000 mL | Freq: Once | INTRAVENOUS | Status: AC
  Administered 2018-02-09: 25 mL via INTRAVENOUS

## 2018-02-09 MED ORDER — DEXTROSE 50 % IV SOLN
INTRAVENOUS | Status: AC
  Filled 2018-02-09: qty 50

## 2018-02-09 MED ORDER — LIDOCAINE 5 % EX PTCH
2.0000 | MEDICATED_PATCH | CUTANEOUS | Status: DC
  Administered 2018-02-09: 2 via TRANSDERMAL
  Filled 2018-02-09: qty 2

## 2018-02-09 MED ORDER — SODIUM CHLORIDE 0.9 % IV BOLUS
1000.0000 mL | Freq: Once | INTRAVENOUS | Status: AC
  Administered 2018-02-09: 1000 mL via INTRAVENOUS

## 2018-02-09 MED ORDER — HYOSCYAMINE SULFATE 0.5 MG/ML IJ SOLN
0.5000 mg | Freq: Once | INTRAMUSCULAR | Status: AC
  Administered 2018-02-09: 0.5 mg via INTRAVENOUS
  Filled 2018-02-09: qty 1

## 2018-02-09 MED ORDER — NALOXONE HCL 0.4 MG/ML IJ SOLN
INTRAMUSCULAR | Status: AC
  Administered 2018-02-09: 2 mg
  Filled 2018-02-09: qty 1

## 2018-02-09 MED ORDER — HYDROMORPHONE HCL 1 MG/ML IJ SOLN
0.5000 mg | INTRAMUSCULAR | Status: DC | PRN
  Administered 2018-02-09: 0.5 mg via INTRAVENOUS
  Filled 2018-02-09: qty 1

## 2018-02-11 ENCOUNTER — Inpatient Hospital Stay (HOSPITAL_COMMUNITY): Admission: RE | Admit: 2018-02-11 | Payer: Medicare Other | Source: Ambulatory Visit

## 2018-02-13 LAB — CULTURE, BLOOD (ROUTINE X 2)
CULTURE: NO GROWTH
Culture: NO GROWTH
SPECIAL REQUESTS: ADEQUATE
Special Requests: ADEQUATE

## 2018-02-16 NOTE — Progress Notes (Addendum)
Discussed patient's condition with her daughter (who is the POA) and with her son at bedside. They are currently leaning towards partial code vs DNR but want to discuss with other family members first. Patient remains FULL CODE for now.   Update:  Patient's daughter and son now both have decided to make patient a partial code. No CPR or Intubation. Okay with BIPAP and Vasopressors as needed. They are tearful and are expressing that they think she may not survive this admission. Have paged chaplain to come pray with them. They are also requesting we give her more dilaudid now. They understand that it could further sedate her and cause worsening of her hypoxia and hypotension. They express that her comfort is more important at this time.

## 2018-02-16 NOTE — Death Summary Note (Signed)
DEATH SUMMARY   Patient Details  Name: [name] MRN: [id number] DOB: September 26, 1928  Admission/Discharge Information   Admit Date:  03/03/2018  Date of Death:    Time of Death:    Length of Stay: 4  Referring Physician: Hulan Fess, MD   Reason(s) for Hospitalization   - sepsis - urinary obstruction s/p ureteral stent - biliary dyskinesia with gallbladder sludge s/p cholecystectomy on 01/27/2018  Diagnoses  Preliminary cause of death:  sepsis Secondary Diagnoses (including complications and co-morbidities):  - urinary obstruction - biliary dyskinesis - anemia  Brief Hospital Course (including significant findings, care, treatment, and services provided and events leading to death)  84 y.o. woman with CAD s/p CABG, diverticulitis, HFpEF, celiac, recent lap cholecystectomy on 01/20/2018 who presents with abdominal pain and nausea likely related to complications/obstruction of previously placed R ureteral stent (11/2017) and L renal atrophy, complicated by UTI and gastritis. She underwent a cholecystectomy for gallbladder sludge due to suspected biliary dyskinesis. She continue to have abdominal pain and overnight/early morning of 2018/03/08 developed hypotension and respiratory distress requiring non-rebreather oxygen and levophed. Suspect that clinical deterioration was due to ongoing urinary obstruction (despite R ureteral stent) was causing urinary obstruction and urosepsis. Urine cultures grew group B strep. Both urology and general surgery teams were consulted and provided care for the patient. On 08-Mar-2018, this provider discussed with family members including daughter Dorian Pod, patient's POA) who decided to pursue palliative care given her overall decline. Family had noted that she has had multiple hospitalizations since 2 months ago after she first developed urinary obstruction. Code status was changed to DNR/DNI and IV opioids were administered for respiratory comfort and analgesia. Patient  expired at 10:40AM on 03-08-18 with family at bedside.   Pertinent Labs and Studies  Significant Diagnostic Studies Ct Abdomen Pelvis Wo Contrast  Result Date: 01/21/2018 CLINICAL DATA:  Abdominal pain with nausea and vomiting, history of recent stent placement EXAM: CT ABDOMEN AND PELVIS WITHOUT CONTRAST TECHNIQUE: Multidetector CT imaging of the abdomen and pelvis was performed following the standard protocol without IV contrast. COMPARISON:  12/25/2017 FINDINGS: Lower chest: No acute abnormality. Hepatobiliary: No focal liver abnormality is seen. No gallstones, gallbladder Dobrowski thickening, or biliary dilatation. Pancreas: Unremarkable. No pancreatic ductal dilatation or surrounding inflammatory changes. Spleen: Normal in size without focal abnormality. Adrenals/Urinary Tract: Kidneys are well visualized bilaterally. Diffuse cortical thinning is noted on the left similar to that seen on the prior exam. Scattered renal cystic changes are noted. No definitive renal calculi are seen. Fullness of the right renal collecting system and proximal right ureter are again identified. A right ureteral stent is noted in satisfactory position. The bladder is well distended. Stomach/Bowel: Diverticular change of the colon is noted without evidence of diverticulitis. The appendix is within normal limits. No obstructive or inflammatory changes are seen. Vascular/Lymphatic: Aortic atherosclerosis. No enlarged abdominal or pelvic lymph nodes. A few small retroperitoneal lymph nodes are identified not significant by size criteria. These are stable from the prior exam. Reproductive: Status post hysterectomy. Other: No abdominal Banning hernia or abnormality. No abdominopelvic ascites. Musculoskeletal: Degenerative changes of the lumbar spine are seen. No acute bony abnormality is noted. IMPRESSION: Right ureteral stent in satisfactory position. There remains some fullness of the right renal collecting system and proximal right  ureter likely related to reflux from the bladder. The known ureteral lesion is not well appreciated. Chronic changes as described above. Electronically Signed   By: Inez Catalina M.D.   On: 01/21/2018 07:51  Dg Chest 2 View  Result Date: 01/21/2018 CLINICAL DATA:  Pain and nausea.  Hypertension. EXAM: CHEST - 2 VIEW COMPARISON:  January 25, 2015 FINDINGS: There is no edema or consolidation. Heart is upper normal in size with pulmonary vascularity normal. No adenopathy. Patient is status post coronary artery bypass grafting. There is aortic atherosclerosis. There is thoracic dextroscoliosis. There is degenerative change throughout the thoracic spine. IMPRESSION: No edema or consolidation. Heart size within normal limits. Status post coronary artery bypass grafting. There is aortic atherosclerosis. Aortic Atherosclerosis (ICD10-I70.0). Electronically Signed   By: Lowella Grip III M.D.   On: 01/21/2018 09:14   Dg Abd 1 View  Result Date: 02-18-2018 CLINICAL DATA:  Hydronephrosis. EXAM: ABDOMEN - 1 VIEW COMPARISON:  Radiograph of same day.  CT scan of February 08, 2018. FINDINGS: The bowel gas pattern is normal. Status post cholecystectomy. Stable position of right ureteral stent is noted. Persistent right nephrogram is noted with stable amount of contrast present within the dilated right renal pelvis. IMPRESSION: Stable right nephrogram is noted with stable amount of contrast seen in right renal pelvis. Right ureteral stent is unchanged in position. This is consistent with reduced renal function. Clinical correlation is recommended. Electronically Signed   By: Marijo Conception, M.D.   On: 2018/02/18 10:03   Dg Abd 1 View  Result Date: 2018/02/18 CLINICAL DATA:  Abdominal pain EXAM: ABDOMEN - 1 VIEW COMPARISON:  02/08/2017 FINDINGS: Nonobstructed bowel-gas pattern. Right ureteral stent in place. Dilated right renal pelvis. Delayed nephrogram on the right. IMPRESSION: 1. Nonobstructed gas pattern 2. Right  ureteral stent with moderate renal pelvis enlargement. Delayed nephrogram on the right consistent with decreased renal function. Electronically Signed   By: Donavan Foil M.D.   On: 02/18/2018 03:39   Ct Abdomen Pelvis W Contrast  Result Date: 02/08/2018 CLINICAL DATA:  Right lower quadrant abdominal pain EXAM: CT ABDOMEN AND PELVIS WITH CONTRAST TECHNIQUE: Multidetector CT imaging of the abdomen and pelvis was performed using the standard protocol following bolus administration of intravenous contrast. CONTRAST:  62mL ISOVUE-300 IOPAMIDOL (ISOVUE-300) INJECTION 61% COMPARISON:  01/21/2018 FINDINGS: Lower chest: Trace bilateral pleural effusions. Associated lower lobe atelectasis. Hepatobiliary: Mildly nodular hepatic contour. Status post cholecystectomy. No intrahepatic or extrahepatic ductal dilatation. Pancreas: Within normal limits. Spleen: Within normal limits. Adrenals/Urinary Tract: Adrenal glands are within normal limits. Left renal cortical scarring/atrophy. Left renal cysts, measuring up to 12 mm. Right renal cysts, measuring up to 16 mm in the interpolar region. Prominent right renal collecting system with indwelling double pigtail ureteral stent. Bladder is within normal limits. Stomach/Bowel: Stomach is within normal limits. No evidence of bowel obstruction. Normal appendix (series 2/image 51). Sigmoid diverticulosis, without evidence of diverticulitis. Vascular/Lymphatic: No evidence of abdominal aortic aneurysm. Atherosclerotic calcifications of the abdominal aorta and branch vessels. Small retroperitoneal lymph nodes, including 11 mm short axis aortocaval node (series 2/image 32), unchanged. No suspicious abdominopelvic lymphadenopathy. Reproductive: Status post hysterectomy. No adnexal masses. Other: Trace pelvic fluid. Musculoskeletal: Degenerative changes of the visualized thoracolumbar spine. Mild lumbar levoscoliosis. Stable mild superior endplate changes at L1. IMPRESSION: Right  double-pigtail ureteral stent. Stable mild prominence of the right renal collecting system. Left renal scarring/atrophy. Bilateral renal cysts, measuring up to 16 mm on the right. No evidence of bowel obstruction. Normal appendix. Sigmoid diverticulosis, without evidence of diverticulitis. Additional stable ancillary findings as above. Electronically Signed   By: Julian Hy M.D.   On: 02/08/2018 15:52   Nm Hepato W/eject Fract  Result Date: 02/02/2018  CLINICAL DATA:  Upper abdominal pain with nausea and vomiting EXAM: NUCLEAR MEDICINE HEPATOBILIARY IMAGING WITH GALLBLADDER EF TECHNIQUE: Sequential images of the abdomen were obtained out to 60 minutes following intravenous administration of radiopharmaceutical. After oral ingestion of Ensure, gallbladder ejection fraction was determined. At 60 min, normal ejection fraction is greater than 33%. RADIOPHARMACEUTICALS:  5.1 mCi Tc-51m  Choletec IV COMPARISON:  None. FINDINGS: Prompt uptake and biliary excretion of activity by the liver is seen. Gallbladder activity is visualized, consistent with patency of cystic duct. Biliary activity passes into small bowel, consistent with patent common bile duct. Calculated gallbladder ejection fraction is 27%. (Normal gallbladder ejection fraction with Ensure is greater than 33%.) IMPRESSION: Normal uptake and excretion of biliary tracer. Decreased gallbladder ejection fraction of 27%. Electronically Signed   By: Inez Catalina M.D.   On: 01/16/2018 13:55   Dg Chest Port 1 View  Result Date: 02-24-18 CLINICAL DATA:  Acute respiratory failure with hypoxia. EXAM: PORTABLE CHEST 1 VIEW COMPARISON:  Radiographs 01/21/2018 lung bases from abdominal CT yesterday. FINDINGS: Very low lung volumes limit assessment. Post median sternotomy. Heart size accentuated by portable technique and low lung volumes. Left basilar opacity and pleural effusion, likely increased. Mild right basilar atelectasis. Vascular congestion versus  bronchovascular crowding. No pneumothorax. IMPRESSION: Low lung volumes. Left basilar opacity and pleural effusion, increased from abdominal CT yesterday. Right basilar atelectasis. Bronchovascular crowding versus vascular congestion. Electronically Signed   By: Jeb Levering M.D.   On: 24-Feb-2018 05:40    Microbiology Recent Results (from the past 240 hour(s))  Urine culture     Status: Abnormal   Collection Time: 01/27/2018  1:20 AM  Result Value Ref Range Status   Specimen Description   Final    URINE, RANDOM Performed at [facility] [address], [place], [place] [address]    Special Requests   Final    NONE Performed at [facility], [place] [address] [place], [place] [address]    Culture (A)  Final    >=100,000 COLONIES/mL GROUP B STREP(S.AGALACTIAE)ISOLATED TESTING AGAINST S. AGALACTIAE NOT ROUTINELY PERFORMED DUE TO PREDICTABILITY OF AMP/PEN/VAN SUSCEPTIBILITY. Performed at [facility], [place] [address] [place], [place] [address]    Report Status 02/05/2018 FINAL  Final  Gastrointestinal Panel by PCR , Stool     Status: None   Collection Time: 01/28/2018  8:00 PM  Result Value Ref Range Status   Campylobacter species NOT DETECTED NOT DETECTED Final   Plesimonas shigelloides NOT DETECTED NOT DETECTED Final   Salmonella species NOT DETECTED NOT DETECTED Final   Yersinia enterocolitica NOT DETECTED NOT DETECTED Final   Vibrio species NOT DETECTED NOT DETECTED Final   Vibrio cholerae NOT DETECTED NOT DETECTED Final   Enteroaggregative E coli (EAEC) NOT DETECTED NOT DETECTED Final   Enteropathogenic E coli (EPEC) NOT DETECTED NOT DETECTED Final   Enterotoxigenic E coli (ETEC) NOT DETECTED NOT DETECTED Final   Shiga like toxin producing E coli (STEC) NOT DETECTED NOT DETECTED Final   Shigella/Enteroinvasive E coli (EIEC) NOT DETECTED NOT DETECTED Final   Cryptosporidium NOT DETECTED NOT DETECTED Final   Cyclospora cayetanensis NOT  DETECTED NOT DETECTED Final   Entamoeba histolytica NOT DETECTED NOT DETECTED Final   Giardia lamblia NOT DETECTED NOT DETECTED Final   Adenovirus F40/41 NOT DETECTED NOT DETECTED Final   Astrovirus NOT DETECTED NOT DETECTED Final   Norovirus GI/GII NOT DETECTED NOT DETECTED Final   Rotavirus A NOT DETECTED NOT DETECTED Final  Sapovirus (I, II, IV, and V) NOT DETECTED NOT DETECTED Final    Comment: Performed at [facility], [address] [place], [place] [address]  MRSA PCR Screening     Status: None   Collection Time: 02-20-2018  4:26 AM  Result Value Ref Range Status   MRSA by PCR NEGATIVE NEGATIVE Final    Comment:        The GeneXpert MRSA Assay (FDA approved for NASAL specimens only), is one component of a comprehensive MRSA colonization surveillance program. It is not intended to diagnose MRSA infection nor to guide or monitor treatment for MRSA infections. Performed at [facility], [place] [address], [place], [place] [address]     Lab Basic Metabolic Panel: Recent Labs  Lab 02/03/18 2139 02/05/18 0423 02/08/2018 0422 02/08/18 0417 02/20/2018 0342  NA 139 140 139 137 138  K 3.8 4.9 4.4 4.4 4.8  CL 98 105 102 104 105  CO2 26 27 25 26  16*  GLUCOSE 155* 125* 94 113* 143*  BUN 25* 18 15 17 23   CREATININE 1.86* 1.45* 1.42* 1.34* 2.39*  CALCIUM 8.9 8.3* 8.9 8.5* 7.9*   Liver Function Tests: Recent Labs  Lab 02/03/18 2139 02/05/18 0423 02/03/2018 0422 02/20/2018 0342  AST 22 16 19  58*  ALT 17 14 14 24   ALKPHOS 89 67 86 76  BILITOT 0.8 0.8 1.0 0.6  PROT 7.2 5.5* 6.8 5.1*  ALBUMIN 3.3* 2.4* 3.1* 2.1*   Recent Labs  Lab 02/03/18 2139 02/20/18 0623  LIPASE 35 311*   No results for input(s): AMMONIA in the last 168 hours. CBC: Recent Labs  Lab 02/05/18 0423 01/28/2018 0422 02/08/18 0417 02/08/18 1642 2018-02-20 0340  WBC 13.0* 17.5* 14.9* 18.1* 11.2*  NEUTROABS  --   --   --  9.5*  --   HGB 9.5* 11.1* 9.5* 11.2* 11.8*  HCT 31.1*  36.5 31.3* 37.2 40.9  MCV 78.9 79.7 80.7 79.8 82.8  PLT 309 409* 341 462* 366   Cardiac Enzymes: Recent Labs  Lab Feb 20, 2018 0623  TROPONINI 0.05*   Sepsis Labs: Recent Labs  Lab 02/12/2018 0422 02/08/18 0417 02/08/18 1642 20-Feb-2018 0340 02-20-18 0342 2018-02-20 0623 2018/02/20 0718  PROCALCITON  --   --   --   --   --  40.27  --   WBC 17.5* 14.9* 18.1* 11.2*  --   --   --   LATICACIDVEN  --   --   --   --  10.2*  --  10.8*    Procedures/Operations   - lap cholecystectomy Feb 20, 2018   [place] Feb 20, 2018, 11:09 AM

## 2018-02-16 NOTE — Consult Note (Signed)
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  She underwent lap chole August 22 and developed right flank pain and right lower quadrant pain yesterday afternoon.  She underwent CT scan of the abdomen and pelvis which showed the stent was in good position, there was some concern about a stone but no stone was seen on the CT (and she had recent ureteroscopy). Her kidney function looked good with Cr at 1.34 improved from 1.86 and even 2.1 earlier in the month. I reviewed the films, chart and labs and discussed with Dr. Joesph Fillers and with nurse and did not feel stent change or further imaging was warranted. In talking with the patient's family she was not voiding well over the past couple of days and not having bowel movements.  Although her white count was down from 17 to 15, I agreed with treatment for UTI given many bacteria on the UA and change to rocephin. Also, discussed hematuria expected with a stent and it was not clinically significant (no clots on CT).   Noted she developed respiratory distress last night and abdominal film was obtained which showed again moderate renal pelvis enlargement which looks stable over her series of images.  She has a right extrarenal pelvis.  The calyces were not distended or tense.  There was a delayed nephrogram consistent with delayed renal function.  A Foley catheter was placed so I repeated the KUB this morning and it looks to me as if contrast has slowly progressed from the kidney into the collecting system in the renal pelvis measures a bit smaller and similar to the prior CT.  Also, urine is visible going down the ureter.  However, she has had low blood pressure and been on pressors and this will significantly affect her kidney function.  Past Medical History:  Diagnosis  Date  . Carotid artery occlusion    1-39% bilateral carotid stenosis by dopplers 05/2016  . Celiac sprue   . Coronary artery disease    s/p CABG with 50% stenosis at ostium of the SVG to dirst diagonal, patent LIMA to LAD and patent left circ with nonobstructive ASCAD of the RCA  . Diabetes mellitus    diet controlled  . Diverticulitis   . Gait disorder   . GERD (gastroesophageal reflux disease)   . Hyperchloremia   . Hyperlipidemia    statin intolerant  . Hypertension   . Meningioma ([place])   . Obesity   . Rosacea    Past Surgical History:  Procedure Laterality Date  . ABDOMINAL HYSTERECTOMY    . BRAIN TUMOR EXCISION  1993   meningioma, left frontal  . BUNIONECTOMY    . CARDIAC CATHETERIZATION    . CHOLECYSTECTOMY N/A 02/13/2018   Procedure: LAPAROSCOPIC CHOLECYSTECTOMY;  Surgeon: Clovis Riley, MD;  Location: [facility];  Service: General;  Laterality: N/A;  . COLONOSCOPY WITH PROPOFOL N/A 01/24/2018   Procedure: COLONOSCOPY WITH PROPOFOL;  Surgeon: Wilford Corner, MD;  Location: [facility];  Service: Endoscopy;  Laterality: N/A;  . CORONARY ARTERY BYPASS GRAFT  2002    3 vessel  . CYSTOSCOPY WITH RETROGRADE PYELOGRAM, URETEROSCOPY AND STENT PLACEMENT Right 12/14/2017   Procedure: CYSTOSCOPY WITH RIGHT RETROGRADE PYELOGRAM, RIGHT URETEROSCOPY AND RIGHT URETERAL STENT PLACEMENT, WASHINGS;  Surgeon: Franchot Gallo, MD;  Location: [facility];  Service: Urology;  Laterality: Right;  .  ESOPHAGOGASTRODUODENOSCOPY (EGD) WITH PROPOFOL N/A 01/24/2018   Procedure: ESOPHAGOGASTRODUODENOSCOPY (EGD) WITH PROPOFOL;  Surgeon: Wilford Corner, MD;  Location: [facility];  Service: Endoscopy;  Laterality: N/A;  . JOINT REPLACEMENT  2009  . REPLACEMENT TOTAL KNEE    . ROTATOR CUFF REPAIR  2010   Dr. Gladstone Lighter  . TONSILLECTOMY      Home Medications:  Medications Prior to Admission  Medication Sig Dispense Refill Last Dose  . amLODipine (NORVASC) 10 MG tablet Take 1 tablet (10 mg total) by  mouth daily. 90 tablet 3 Past Week at Unknown time  . Ascorbic Acid (VITAMIN C PO) Take 1 tablet by mouth daily at 3 pm.   Past Week at Unknown time  . aspirin EC 81 MG tablet Take 81 mg by mouth daily at 3 pm.   Past Week at Unknown time  . azelastine (ASTELIN) 0.1 % nasal spray Place 1 spray into both nostrils 2 (two) times daily. Use in each nostril as directed   02/03/2018 at Unknown time  . Biotin 5000 MCG TABS Take 5,000 mcg by mouth once a week. On Tuesday   Past Week at Unknown time  . Calcium Citrate (CITRACAL PO) Take 1 tablet by mouth daily at 3 pm.    Past Week at Unknown time  . carvedilol (COREG) 3.125 MG tablet Take 1.5625 mg by mouth 2 (two) times daily.    02/03/2018 at 1400  . Cholecalciferol 4000 UNITS CAPS Take 4,000 Units by mouth daily at 3 pm.   Past Week at Unknown time  . cloNIDine (CATAPRES) 0.1 MG tablet Take 0.1 mg by mouth 2 (two) times daily.   02/03/2018 at Unknown time  . ferrous sulfate 325 (65 FE) MG tablet Take 1 tablet (325 mg total) by mouth 2 (two) times daily with a meal. 60 tablet 0 02/03/2018 at Unknown time  . furosemide (LASIX) 40 MG tablet Take 1 tablet (40 mg total) by mouth daily. Morning and mid evening (Patient taking differently: Take 40 mg by mouth 2 (two) times daily. Morning and mid evening) 30 tablet 11 02/03/2018 at Unknown time  . irbesartan (AVAPRO) 300 MG tablet Take 300 mg by mouth daily.   02/03/2018 at Unknown time  . Levothyroxine Sodium 50 MCG CAPS Take 1 capsule by mouth daily.    02/03/2018 at Unknown time  . METRONIDAZOLE, TOPICAL, 0.75 % LOTN Apply 1 application topically at bedtime as needed (rosacea).   Past Month at Unknown time  . Multiple Vitamin (MULITIVITAMIN WITH MINERALS) TABS Take 1 tablet by mouth daily at 3 pm.    Past Week at Unknown time  . ondansetron (ZOFRAN) 4 MG tablet Take 4 mg by mouth every 8 (eight) hours as needed for nausea. for nausea  1 02/03/2018 at Unknown time  . pantoprazole (PROTONIX) 40 MG tablet Take 40 mg by  mouth daily before breakfast.   02/03/2018 at Unknown time  . potassium chloride (KLOR-CON) 8 MEQ tablet Take 8 mEq by mouth daily.   11 02/03/2018 at Unknown time  . predniSONE (DELTASONE) 1 MG tablet Take 3 mg by mouth daily. Take with 5 mg tablet   02/03/2018 at Unknown time  . predniSONE (DELTASONE) 5 MG tablet Take 5 mg by mouth daily. Take with 1 mg tablets   02/03/2018 at Unknown time  . pyridOXINE (VITAMIN B-6) 100 MG tablet Take 100 mg by mouth once a week. On Thursday   Past Week at Unknown time  . traMADol (ULTRAM) 50 MG tablet Take  50 mg by mouth every 4 (four) hours as needed for moderate pain.   0 02/03/2018 at Unknown time  . vitamin B-12 (CYANOCOBALAMIN) 500 MCG tablet Take 500 mcg by mouth once a week. On Thursdays   Past Week at Unknown time   Allergies:  Allergies  Allergen Reactions  . Accutane [Isotretinoin] Other (See Comments)    Affected liver  . Ciprofloxacin Rash and Other (See Comments)    Kidney failure  . Erythromycin Diarrhea and Rash  . Gluten Diarrhea and Other (See Comments)    Celiac sprue  . Gluten Meal Diarrhea and Rash    Celiac sprue intolerable  . Isotretinoin Other (See Comments)    Affects liver  . Lipitor [Atorvastatin] Other (See Comments)    Affects legs  . Lisinopril Other (See Comments)    Pt doesn't remember reaction   . Lovastatin Other (See Comments)    Affects legs  . Penicillins Other (See Comments)    From childhood Has patient had a PCN reaction causing immediate rash, facial/tongue/throat swelling, SOB or lightheadedness with hypotension: Unknown Has patient had a PCN reaction causing severe rash involving mucus membranes or skin necrosis: Unknown Has patient had a PCN reaction that required hospitalization: Unknown Has patient had a PCN reaction occurring within the last 10 years: No If all of the above answers are "NO", then may proceed with Cephalosporin use.  Marland Kitchen Phenytoin Sodium Extended Other (See Comments)    Affects liver   . Pravastatin Other (See Comments)    Affected legs  . Crestor [Rosuvastatin]     Muscle aches even on 5mg  once weekly  . Gabapentin Other (See Comments)    Shaky, swollen legs  . Hydrocodone Other (See Comments)    Restless leg  . Metoprolol Itching and Swelling  . Prolia [Denosumab] Other (See Comments)    Joints and muscle aches, loss of hair  . Boniva [Ibandronic Acid] Other (See Comments)    headaches  . Clarithromycin Rash  . Hydralazine Rash    Pt does not know what the reactions were.  Marland Kitchen Lescol [Fluvastatin Sodium] Other (See Comments)    Affects legs  . Sulfa Antibiotics Rash  . Sulfasalazine Rash  . Welchol [Colesevelam Hcl] Other (See Comments)    No energy  . Zetia [Ezetimibe] Other (See Comments)    myalgias  . Zithromax [Azithromycin] Rash    Family History  Problem Relation Age of Onset  . Diabetes Mother   . Heart disease Brother   . Diabetes Sister   . Diabetes Sister    Social History:  reports that she has never smoked. She has never used smokeless tobacco. She reports that she does not drink alcohol or use drugs.  ROS: A complete review of systems was performed.  All systems are negative except for pertinent findings as noted. Review of Systems  Unable to perform ROS: Acuity of condition     Physical Exam:  Vital signs in last 24 hours: Temp:  [97.6 F (36.4 C)-102.8 F (39.3 C)] 97.6 F (36.4 C) (08/25 0800) Pulse Rate:  [88-117] 94 (08/25 0915) Resp:  [16-43] 36 (08/25 0915) BP: (68-161)/(35-66) 112/50 (08/25 0915) SpO2:  [90 %-95 %] 90 % (08/25 0915) FiO2 (%):  [60 %] 60 % (08/25 0411) Weight:  [79.9 kg] 79.9 kg (08/25 0400) General:  Alert and oriented, in respiratory distress with a facemask Cardiovascular: Regular rate and rhythm, tachycardic  lungs: Increased work of breathing Abdomen: Soft, nontender, nondistended, no abdominal masses  Neurologic: Grossly intact GU: Foley in place, urine dark, red   Laboratory Data:  Results  for orders placed or performed during the hospital encounter of 01/26/2018 (from the past 24 hour(s))  CBC with Differential/Platelet     Status: Abnormal   Collection Time: 02/08/18  4:42 PM  Result Value Ref Range   WBC 18.1 (H) 4.0 - 10.5 K/uL   RBC 4.66 3.87 - 5.11 MIL/uL   Hemoglobin 11.2 (L) 12.0 - 15.0 g/dL   HCT 37.2 36.0 - 46.0 %   MCV 79.8 78.0 - 100.0 fL   MCH 24.0 (L) 26.0 - 34.0 pg   MCHC 30.1 30.0 - 36.0 g/dL   RDW 18.3 (H) 11.5 - 15.5 %   Platelets 462 (H) 150 - 400 K/uL   Neutrophils Relative % 52 %   Neutro Abs 9.5 (H) 1.7 - 7.7 K/uL   Lymphocytes Relative 44 %   Lymphs Abs 8.0 (H) 0.7 - 4.0 K/uL   Monocytes Relative 3 %   Monocytes Absolute 0.6 0.1 - 1.0 K/uL   Eosinophils Relative 0 %   Eosinophils Absolute 0.0 0.0 - 0.7 K/uL   Basophils Relative 1 %   Basophils Absolute 0.1 0.0 - 0.1 K/uL   WBC Morphology ABSOLUTE LYMPHOCYTOSIS    Smear Review PENDING PATHOLOGIST REVIEW   Urinalysis, Routine w reflex microscopic     Status: Abnormal   Collection Time: 02/08/18  4:58 PM  Result Value Ref Range   Color, Urine YELLOW YELLOW   APPearance CLOUDY (A) CLEAR   Specific Gravity, Urine 1.020 1.005 - 1.030   pH 6.5 5.0 - 8.0   Glucose, UA NEGATIVE NEGATIVE mg/dL   Hgb urine dipstick LARGE (A) NEGATIVE   Bilirubin Urine NEGATIVE NEGATIVE   Ketones, ur NEGATIVE NEGATIVE mg/dL   Protein, ur 100 (A) NEGATIVE mg/dL   Nitrite NEGATIVE NEGATIVE   Leukocytes, UA LARGE (A) NEGATIVE  Urinalysis, Microscopic (reflex)     Status: Abnormal   Collection Time: 02/08/18  4:58 PM  Result Value Ref Range   RBC / HPF >50 0 - 5 RBC/hpf   WBC, UA 21-50 0 - 5 WBC/hpf   Bacteria, UA MANY (A) NONE SEEN   Squamous Epithelial / LPF 0-5 0 - 5  CBC     Status: Abnormal   Collection Time: 03-Mar-2018  3:40 AM  Result Value Ref Range   WBC 11.2 (H) 4.0 - 10.5 K/uL   RBC 4.94 3.87 - 5.11 MIL/uL   Hemoglobin 11.8 (L) 12.0 - 15.0 g/dL   HCT 40.9 36.0 - 46.0 %   MCV 82.8 78.0 - 100.0 fL   MCH  23.9 (L) 26.0 - 34.0 pg   MCHC 28.9 (L) 30.0 - 36.0 g/dL   RDW 18.7 (H) 11.5 - 15.5 %   Platelets 366 150 - 400 K/uL  Comprehensive metabolic panel     Status: Abnormal   Collection Time: 03-Mar-2018  3:42 AM  Result Value Ref Range   Sodium 138 135 - 145 mmol/L   Potassium 4.8 3.5 - 5.1 mmol/L   Chloride 105 98 - 111 mmol/L   CO2 16 (L) 22 - 32 mmol/L   Glucose, Bld 143 (H) 70 - 99 mg/dL   BUN 23 8 - 23 mg/dL   Creatinine, Ser 2.39 (H) 0.44 - 1.00 mg/dL   Calcium 7.9 (L) 8.9 - 10.3 mg/dL   Total Protein 5.1 (L) 6.5 - 8.1 g/dL   Albumin 2.1 (L) 3.5 - 5.0 g/dL  AST 58 (H) 15 - 41 U/L   ALT 24 0 - 44 U/L   Alkaline Phosphatase 76 38 - 126 U/L   Total Bilirubin 0.6 0.3 - 1.2 mg/dL   GFR calc non Af Amer 17 (L) >60 mL/min   GFR calc Af Amer 20 (L) >60 mL/min   Anion gap 17 (H) 5 - 15  Lactic acid, plasma     Status: Abnormal   Collection Time: 2018-03-05  3:42 AM  Result Value Ref Range   Lactic Acid, Venous 10.2 (HH) 0.5 - 1.9 mmol/L  Glucose, capillary     Status: Abnormal   Collection Time: March 05, 2018  4:13 AM  Result Value Ref Range   Glucose-Capillary 124 (H) 70 - 99 mg/dL  MRSA PCR Screening     Status: None   Collection Time: 03-05-2018  4:26 AM  Result Value Ref Range   MRSA by PCR NEGATIVE NEGATIVE  Blood gas, arterial     Status: Abnormal   Collection Time: 2018-03-05  4:43 AM  Result Value Ref Range   FIO2 60.00    Delivery systems BILEVEL POSITIVE AIRWAY PRESSURE    LHR 12 resp/min   Inspiratory PAP 16    Expiratory PAP 8    pH, Arterial 7.275 (L) 7.350 - 7.450   pCO2 arterial 32.9 32.0 - 48.0 mmHg   pO2, Arterial 101 83.0 - 108.0 mmHg   Bicarbonate 14.4 (L) 20.0 - 28.0 mmol/L   Acid-base deficit 10.6 (H) 0.0 - 2.0 mmol/L   O2 Saturation 96.1 %   Patient temperature 101.4    Collection site LEFT RADIAL    Drawn by [phone]    Sample type ARTERIAL DRAW    Allens test (pass/fail) PASS PASS  Glucose, capillary     Status: None   Collection Time: March 05, 2018  5:27 AM   Result Value Ref Range   Glucose-Capillary 90 70 - 99 mg/dL  Cortisol     Status: None   Collection Time: 03/05/2018  6:23 AM  Result Value Ref Range   Cortisol, Plasma 30.1 ug/dL  Procalcitonin - Baseline     Status: None   Collection Time: 03/05/2018  6:23 AM  Result Value Ref Range   Procalcitonin 40.27 ng/mL  Lipase, blood     Status: Abnormal   Collection Time: 03/05/2018  6:23 AM  Result Value Ref Range   Lipase 311 (H) 11 - 51 U/L  Troponin I     Status: Abnormal   Collection Time: 2018/03/05  6:23 AM  Result Value Ref Range   Troponin I 0.05 (HH) <0.03 ng/mL  Lactic acid, plasma     Status: Abnormal   Collection Time: 03-05-18  7:18 AM  Result Value Ref Range   Lactic Acid, Venous 10.8 (HH) 0.5 - 1.9 mmol/L  Glucose, capillary     Status: Abnormal   Collection Time: 03-05-2018  7:41 AM  Result Value Ref Range   Glucose-Capillary 67 (L) 70 - 99 mg/dL  Glucose, capillary     Status: Abnormal   Collection Time: Mar 05, 2018  8:43 AM  Result Value Ref Range   Glucose-Capillary 105 (H) 70 - 99 mg/dL   Recent Results (from the past 240 hour(s))  Urine culture     Status: Abnormal   Collection Time: 02/14/2018  1:20 AM  Result Value Ref Range Status   Specimen Description   Final    URINE, RANDOM Performed at [facility], [place] [address] [place], [place] [address]  Special Requests   Final    NONE Performed at [facility], [place] [address] [place], [place] [address]    Culture (A)  Final    >=100,000 COLONIES/mL GROUP B STREP(S.AGALACTIAE)ISOLATED TESTING AGAINST S. AGALACTIAE NOT ROUTINELY PERFORMED DUE TO PREDICTABILITY OF AMP/PEN/VAN SUSCEPTIBILITY. Performed at [facility], [place] [address], [place], [place] [address]    Report Status 02/05/2018 FINAL  Final  Gastrointestinal Panel by PCR , Stool     Status: None   Collection Time: 01/18/2018  8:00 PM  Result Value Ref Range Status   Campylobacter species NOT DETECTED NOT DETECTED  Final   Plesimonas shigelloides NOT DETECTED NOT DETECTED Final   Salmonella species NOT DETECTED NOT DETECTED Final   Yersinia enterocolitica NOT DETECTED NOT DETECTED Final   Vibrio species NOT DETECTED NOT DETECTED Final   Vibrio cholerae NOT DETECTED NOT DETECTED Final   Enteroaggregative E coli (EAEC) NOT DETECTED NOT DETECTED Final   Enteropathogenic E coli (EPEC) NOT DETECTED NOT DETECTED Final   Enterotoxigenic E coli (ETEC) NOT DETECTED NOT DETECTED Final   Shiga like toxin producing E coli (STEC) NOT DETECTED NOT DETECTED Final   Shigella/Enteroinvasive E coli (EIEC) NOT DETECTED NOT DETECTED Final   Cryptosporidium NOT DETECTED NOT DETECTED Final   Cyclospora cayetanensis NOT DETECTED NOT DETECTED Final   Entamoeba histolytica NOT DETECTED NOT DETECTED Final   Giardia lamblia NOT DETECTED NOT DETECTED Final   Adenovirus F40/41 NOT DETECTED NOT DETECTED Final   Astrovirus NOT DETECTED NOT DETECTED Final   Norovirus GI/GII NOT DETECTED NOT DETECTED Final   Rotavirus A NOT DETECTED NOT DETECTED Final   Sapovirus (I, II, IV, and V) NOT DETECTED NOT DETECTED Final    Comment: Performed at [facility], [place] [place], [place] [address]  MRSA PCR Screening     Status: None   Collection Time: Mar 08, 2018  4:26 AM  Result Value Ref Range Status   MRSA by PCR NEGATIVE NEGATIVE Final    Comment:        The GeneXpert MRSA Assay (FDA approved for NASAL specimens only), is one component of a comprehensive MRSA colonization surveillance program. It is not intended to diagnose MRSA infection nor to guide or monitor treatment for MRSA infections. Performed at [facility], [place] [address], [place], [place] [address]    Creatinine: Recent Labs    02/03/18 2139 02/05/18 0423 02/03/2018 0422 02/08/18 0417 2018/03/08 0342  CREATININE 1.86* 1.45* 1.42* 1.34* 2.39*    Impression/Assessment/plan: -Right flank and lower quadrant pain -again, stent in  good position with stable renal function yesterday, so would not exchange stent.   -AKI - Declining kidney function today certainly related to her drop in blood pressure. Her kidney function was improved yesterday prior to events last night. Foley as above.    -Sepsis, respiratory distress-per CCM.  I discussed the patient with Dr.Olalrer.    Discussed patient with four family members present.   Festus Aloe 08-Mar-2018, 10:37 AM

## 2018-02-16 NOTE — Significant Event (Signed)
Significant event: death exam  Time of death: 10:40 AM 02/21/2018  [name]; mrn [id number]  As per goals of care discussion with family, decision made to pursue comfort care. After non rebreather removed and spot dose 1mg  IV morphine administered for pain control and respiratory comfort. Family at bedside, including Dorian Pod (daughter, [place]). At 10:40, provider called to bedside because of cessation of activity on telemetry.   Death exam:  Last temperature was 97.82F. Absent pupillary and corneal reflexes bilaterally. Absent oculocephalic reflex. No respiratory or heart sounds auscultated. No pulse. Patient is unresponsive.  Time of death: 10:40 AM.

## 2018-02-16 NOTE — Progress Notes (Signed)
She continues to struggle  Worsening respiratory failure  Discussed multiple times with family members at bedside  Discussed with general surgery Dr. Lucia Gaskins  Discussed with urology  Patient was on IV fluids started having worsening respiratory distress, gurgling Increasing requirement with pressors  Was placed on BiPAP-having abdominal discomfort  IV fluid was stopped  Taken off BiPAP  Following discussions with family-patient will be comfort only  We will give him morphine  Unable to keep her saturations in the 70s with a  Mask  Family aware that this is a terminal process  We will work on making her as comfortable as possible

## 2018-02-16 NOTE — Progress Notes (Signed)
[facility]:  [phone] General Surgery Progress Note   LOS: 4 days  POD -  3 Days Post-Op  Chief Complaint: Abdominal pain  Assessment and Plan: 1.  LLL pneumonia, hypoxic respiratory failure  Moved to ICU - critically ill  Lactic acid - 10.2 - Feb 24, 2018  On Merrem/Vancomycin  Discussed with Dr. Ander Slade, CCM  2.  Right ureteral double J stent placed by Dr. Diona Fanti on 12/14/2017 for distal ureteral tumor with hydronephrosis  Creatinine - 2.39 - 2018/02/24  KUB at 3:00 AM shows contrast still in right ureteral system - suggesting obstructed right ureter  3.  LAPAROSCOPIC CHOLECYSTECTOMY - 01/28/2018 [name]  For biliary dyskinesia  No evidence by CT scan of problems with cholecystectomy  4. Anemia  Hgb - 11.8 - Feb 24, 2018  (Heme concentrated since yesterday) 5.  DVT prophylaxis - On lovenox 6.  On prednisone for polymyalgia rheumatica - followed by Dr. Trudie Reed 7.  HTN   Active Problems:   Type 2 diabetes mellitus with complication (HCC)   Abdominal pain   UTI (urinary tract infection)   Nausea & vomiting   Diarrhea   Nausea and vomiting  Subjective:  In continued right sided abdominal pain.  Shallow breathing.  Somewhat difficult to understand.  Daughter, Sarajane Jews, and son Shanon Brow, at bedside.  Discussed with family.  Objective:   Vitals:   02/24/2018 0500 24-Feb-2018 0600  BP: (!) 85/43 (!) 116/57  Pulse: 96 (!) 101  Resp: (!) 23 (!) 28  Temp:    SpO2: 95% 93%     Intake/Output from previous day:  08/24 0701 - 08/25 0700 In: 622.8 [P.O.:240; I.V.:132.8] Out: 600 [Urine:600]  Intake/Output this shift:  No intake/output data recorded.   Physical Exam:   General: Older WF who is sick.   HEENT: Normal. Pupils equal. .   Lungs: Poor inspiratory effort   Abdomen: Tender with some guarding, more on the right side.  Has rare BS.   Wound: Clean   Lab Results:    Recent Labs    02/08/18 1642 February 24, 2018 0340  WBC 18.1* 11.2*  HGB 11.2*  11.8*  HCT 37.2 40.9  PLT 462* 366    BMET   Recent Labs    02/08/18 0417 2018-02-24 0342  NA 137 138  K 4.4 4.8  CL 104 105  CO2 26 16*  GLUCOSE 113* 143*  BUN 17 23  CREATININE 1.34* 2.39*  CALCIUM 8.5* 7.9*    PT/INR  No results for input(s): LABPROT, INR in the last 72 hours.  ABG   Recent Labs    Feb 24, 2018 0443  PHART 7.275*  HCO3 14.4*     Studies/Results:  Dg Abd 1 View  Result Date: 02/24/2018 CLINICAL DATA:  Abdominal pain EXAM: ABDOMEN - 1 VIEW COMPARISON:  02/08/2017 FINDINGS: Nonobstructed bowel-gas pattern. Right ureteral stent in place. Dilated right renal pelvis. Delayed nephrogram on the right. IMPRESSION: 1. Nonobstructed gas pattern 2. Right ureteral stent with moderate renal pelvis enlargement. Delayed nephrogram on the right consistent with decreased renal function. Electronically Signed   By: Donavan Foil M.D.   On: Feb 24, 2018 03:39   Ct Abdomen Pelvis W Contrast  Result Date: 02/08/2018 CLINICAL DATA:  Right lower quadrant abdominal pain EXAM: CT ABDOMEN AND PELVIS WITH CONTRAST TECHNIQUE: Multidetector CT imaging of the abdomen and pelvis was performed using the standard protocol following bolus administration of intravenous contrast. CONTRAST:  27mL ISOVUE-300 IOPAMIDOL (ISOVUE-300) INJECTION 61% COMPARISON:  01/21/2018 FINDINGS: Lower chest: Trace bilateral pleural  effusions. Associated lower lobe atelectasis. Hepatobiliary: Mildly nodular hepatic contour. Status post cholecystectomy. No intrahepatic or extrahepatic ductal dilatation. Pancreas: Within normal limits. Spleen: Within normal limits. Adrenals/Urinary Tract: Adrenal glands are within normal limits. Left renal cortical scarring/atrophy. Left renal cysts, measuring up to 12 mm. Right renal cysts, measuring up to 16 mm in the interpolar region. Prominent right renal collecting system with indwelling double pigtail ureteral stent. Bladder is within normal limits. Stomach/Bowel: Stomach is within  normal limits. No evidence of bowel obstruction. Normal appendix (series 2/image 51). Sigmoid diverticulosis, without evidence of diverticulitis. Vascular/Lymphatic: No evidence of abdominal aortic aneurysm. Atherosclerotic calcifications of the abdominal aorta and branch vessels. Small retroperitoneal lymph nodes, including 11 mm short axis aortocaval node (series 2/image 32), unchanged. No suspicious abdominopelvic lymphadenopathy. Reproductive: Status post hysterectomy. No adnexal masses. Other: Trace pelvic fluid. Musculoskeletal: Degenerative changes of the visualized thoracolumbar spine. Mild lumbar levoscoliosis. Stable mild superior endplate changes at L1. IMPRESSION: Right double-pigtail ureteral stent. Stable mild prominence of the right renal collecting system. Left renal scarring/atrophy. Bilateral renal cysts, measuring up to 16 mm on the right. No evidence of bowel obstruction. Normal appendix. Sigmoid diverticulosis, without evidence of diverticulitis. Additional stable ancillary findings as above. Electronically Signed   By: Julian Hy M.D.   On: 02/08/2018 15:52   Dg Chest Port 1 View  Result Date: 2018/02/21 CLINICAL DATA:  Acute respiratory failure with hypoxia. EXAM: PORTABLE CHEST 1 VIEW COMPARISON:  Radiographs 01/21/2018 lung bases from abdominal CT yesterday. FINDINGS: Very low lung volumes limit assessment. Post median sternotomy. Heart size accentuated by portable technique and low lung volumes. Left basilar opacity and pleural effusion, likely increased. Mild right basilar atelectasis. Vascular congestion versus bronchovascular crowding. No pneumothorax. IMPRESSION: Low lung volumes. Left basilar opacity and pleural effusion, increased from abdominal CT yesterday. Right basilar atelectasis. Bronchovascular crowding versus vascular congestion. Electronically Signed   By: Jeb Levering M.D.   On: 02-21-18 05:40     Anti-infectives:   Anti-infectives (From admission,  onward)   Start     Dose/Rate Route Frequency Ordered Stop   2018-02-21 0630  meropenem (MERREM) 1 g in sodium chloride 0.9 % 100 mL IVPB     1 g 200 mL/hr over 30 Minutes Intravenous Every 12 hours 21-Feb-2018 0613     February 21, 2018 0600  vancomycin (VANCOCIN) 1,750 mg in sodium chloride 0.9 % 500 mL IVPB     1,750 mg 250 mL/hr over 120 Minutes Intravenous  Once 02/21/2018 0529 2018/02/21 0757   Feb 21, 2018 0542  vancomycin variable dose per unstable renal function (pharmacist dosing)      Does not apply See admin instructions 02-21-2018 0542     02/08/18 1930  cefTRIAXone (ROCEPHIN) 1 g in sodium chloride 0.9 % 100 mL IVPB  Status:  Discontinued     1 g 200 mL/hr over 30 Minutes Intravenous Every 24 hours 02/08/18 1855 02/21/2018 0503   02/14/2018 0600  ceFAZolin (ANCEF) IVPB 2g/100 mL premix     2 g 200 mL/hr over 30 Minutes Intravenous 30 min pre-op 02/05/18 1524 01/27/2018 0905   02/05/18 1200  cephALEXin (KEFLEX) capsule 500 mg  Status:  Discontinued     500 mg Oral Every 12 hours 02/05/18 1055 02/08/18 1855      Alphonsa Overall, MD, FACS Pager: [facility]: [phone] February 21, 2018

## 2018-02-16 NOTE — Significant Event (Signed)
Rapid Response Event Note  Overview: Time Called: 0240 Arrival Time: 0250 Event Type: Other (Comment)(Pain, Fever)  Initial Focused Assessment: Patient moaning in pain, patient most sensitive when palpated in her right lower quadrant. Patient diaphoretic and pale. Patient rectal temperature 102.75F, rectal Tylenol given. Oxygen saturation initially 88% on 2LNC. Per RN patient's BP was 94/66 and 500 cc bolus was infusing per order from NP.  Interventions: Patient placed on Veni mask 14L/55% with oxygen saturation improving to 96%.  KUB & CBC ordered by NP at bedside Lactic acid ordered by Rapid Response RN.  NP spoke with patient's daughter at bedside.  Plan of Care (if not transferred): Transferred to [facility] unit.   Event Summary:   [name]

## 2018-02-16 NOTE — Progress Notes (Signed)
Patient awoke with increased abdominal pain, PRN and one time dose of pain medication given. No relief. Patient became in distress. RRT called on patient at 0250 for rectal temp of 102.8, increased respirations and increased heart rate. Jeannette Corpus NP paged, new orders placed. Rectal tylenol given, foley inserted, NS bolus administered and patient placed on Venturi mask at 10L. Practitioner arrived to the bedside. Spoke with daughter at the bedside. Patient transferred to room 1232. Bedside report given to [employer].

## 2018-02-16 NOTE — Progress Notes (Signed)
Patient's family expressed concerns over patient's pain medication and test results. Spoke with Dr. Junious Silk and was told to inform family that no further testing would be done this evening and that he would round in the AM. Practitioner on call, Jeannette Corpus NP, informed of patient's pain medication concern. Practitioner did not give any new orders, explained that family would have to speak to [facility] in the AM for this request. Relayed this message to the family, family verbalized understanding with Luz Brazen RN- Administrative Coordinator present.

## 2018-02-16 NOTE — Consult Note (Signed)
PULMONARY / CRITICAL CARE MEDICINE   Name: [name] MRN: [id number] DOB: 02/14/1929    ADMISSION DATE:  02/05/2018 CONSULTATION DATE: Feb 18, 2018  REFERRING MD: Dr Hampton Abbot  CHIEF COMPLAINT: Septic Shock, Acute hypoxic respiratory failure  HISTORY OF PRESENT ILLNESS:   89yoF with hx HTN, Obesity, GERD, DM, CAD s/p CABG, Diverticulitis, Polymyalgia rheumatica, Celiac disease, and Grade 2 diastolic dysfunction, who is s/p Right ureteral stent placement 11/2017 for treatment of obstructing ureteral tumor, now readmitted 02/05/08 with c/o N/V/D and RUQ abd pain ever since June. In the interim prior to this admission, she had an EGD on 8/9 that showed acute gastritis, small hiatal hernia, and nonobstructing schatzki ring. Colonoscopy was also performed and showed diverticulosis and internal hemorrhoids but no other abnormalities. On admission, patient was found to have a UTI and AKI-on-CKD. HIDA scan was performed showing biliary dyskinesia. Decision was made to proceed with elective laparoscopic cholecystectomy which was performed on 8/22. Since then patient has had continued abdominal pain which per records was noted to have greatly worsened on 8/24. CT Abdomen with contrast on 8/24 showed no abnormalities. Patient was treated with multiple doses of dilaudid (6.5mg  IV within past 24hrs, of which 3.5mg  IV given this evening), as well as Oxycodone (25mg  IV given within past 24hrs, of which 10mg  received this evening), as well as Toradol. Patient became more lethargic, hypotensive, and had worsening hypoxia. She was placed on BIPAP and moved to the ICU. PCCM consulted.   At time of my exam patient is lethargic, arouses briefly to loud voice or physical stimulus, will answer questions and moan in pain, then quickly falls back asleep. She reports diffuse abdominal pain. She is otherwise a poor historian given her severe lethargy.   PAST MEDICAL HISTORY :  She  has a past medical history of Carotid artery  occlusion, Celiac sprue, Coronary artery disease, Diabetes mellitus, Diverticulitis, Gait disorder, GERD (gastroesophageal reflux disease), Hyperchloremia, Hyperlipidemia, Hypertension, Meningioma ([place]), Obesity, and Rosacea.  PAST SURGICAL HISTORY: She  has a past surgical history that includes Abdominal hysterectomy; Brain tumor excision (1993); Joint replacement (2009); Coronary artery bypass graft (2002); Rotator cuff repair (2010); Bunionectomy; Replacement total knee; Tonsillectomy; Cardiac catheterization; Cystoscopy with retrograde pyelogram, ureteroscopy and stent placement (Right, 12/14/2017); Esophagogastroduodenoscopy (egd) with propofol (N/A, 01/24/2018); Colonoscopy with propofol (N/A, 01/24/2018); and Cholecystectomy (N/A, 01/18/2018).  Allergies  Allergen Reactions  . Accutane [Isotretinoin] Other (See Comments)    Affected liver  . Ciprofloxacin Rash and Other (See Comments)    Kidney failure  . Erythromycin Diarrhea and Rash  . Gluten Diarrhea and Other (See Comments)    Celiac sprue  . Gluten Meal Diarrhea and Rash    Celiac sprue intolerable  . Isotretinoin Other (See Comments)    Affects liver  . Lipitor [Atorvastatin] Other (See Comments)    Affects legs  . Lisinopril Other (See Comments)    Pt doesn't remember reaction   . Lovastatin Other (See Comments)    Affects legs  . Penicillins Other (See Comments)    From childhood Has patient had a PCN reaction causing immediate rash, facial/tongue/throat swelling, SOB or lightheadedness with hypotension: Unknown Has patient had a PCN reaction causing severe rash involving mucus membranes or skin necrosis: Unknown Has patient had a PCN reaction that required hospitalization: Unknown Has patient had a PCN reaction occurring within the last 10 years: No If all of the above answers are "NO", then may proceed with Cephalosporin use.  Marland Kitchen Phenytoin Sodium Extended Other (See Comments)  Affects liver  . Pravastatin Other (See  Comments)    Affected legs  . Crestor [Rosuvastatin]     Muscle aches even on 5mg  once weekly  . Gabapentin Other (See Comments)    Shaky, swollen legs  . Hydrocodone Other (See Comments)    Restless leg  . Metoprolol Itching and Swelling  . Prolia [Denosumab] Other (See Comments)    Joints and muscle aches, loss of hair  . Boniva [Ibandronic Acid] Other (See Comments)    headaches  . Clarithromycin Rash  . Hydralazine Rash    Pt does not know what the reactions were.  Marland Kitchen Lescol [Fluvastatin Sodium] Other (See Comments)    Affects legs  . Sulfa Antibiotics Rash  . Sulfasalazine Rash  . Welchol [Colesevelam Hcl] Other (See Comments)    No energy  . Zetia [Ezetimibe] Other (See Comments)    myalgias  . Zithromax [Azithromycin] Rash    No current facility-administered medications on file prior to encounter.    Current Outpatient Medications on File Prior to Encounter  Medication Sig  . amLODipine (NORVASC) 10 MG tablet Take 1 tablet (10 mg total) by mouth daily.  . Ascorbic Acid (VITAMIN C PO) Take 1 tablet by mouth daily at 3 pm.  . aspirin EC 81 MG tablet Take 81 mg by mouth daily at 3 pm.  . azelastine (ASTELIN) 0.1 % nasal spray Place 1 spray into both nostrils 2 (two) times daily. Use in each nostril as directed  . Biotin 5000 MCG TABS Take 5,000 mcg by mouth once a week. On Tuesday  . Calcium Citrate (CITRACAL PO) Take 1 tablet by mouth daily at 3 pm.   . carvedilol (COREG) 3.125 MG tablet Take 1.5625 mg by mouth 2 (two) times daily.   . Cholecalciferol 4000 UNITS CAPS Take 4,000 Units by mouth daily at 3 pm.  . cloNIDine (CATAPRES) 0.1 MG tablet Take 0.1 mg by mouth 2 (two) times daily.  . ferrous sulfate 325 (65 FE) MG tablet Take 1 tablet (325 mg total) by mouth 2 (two) times daily with a meal.  . furosemide (LASIX) 40 MG tablet Take 1 tablet (40 mg total) by mouth daily. Morning and mid evening (Patient taking differently: Take 40 mg by mouth 2 (two) times daily.  Morning and mid evening)  . irbesartan (AVAPRO) 300 MG tablet Take 300 mg by mouth daily.  . Levothyroxine Sodium 50 MCG CAPS Take 1 capsule by mouth daily.   Marland Kitchen METRONIDAZOLE, TOPICAL, 0.75 % LOTN Apply 1 application topically at bedtime as needed (rosacea).  . Multiple Vitamin (MULITIVITAMIN WITH MINERALS) TABS Take 1 tablet by mouth daily at 3 pm.   . ondansetron (ZOFRAN) 4 MG tablet Take 4 mg by mouth every 8 (eight) hours as needed for nausea. for nausea  . pantoprazole (PROTONIX) 40 MG tablet Take 40 mg by mouth daily before breakfast.  . potassium chloride (KLOR-CON) 8 MEQ tablet Take 8 mEq by mouth daily.   . predniSONE (DELTASONE) 1 MG tablet Take 3 mg by mouth daily. Take with 5 mg tablet  . predniSONE (DELTASONE) 5 MG tablet Take 5 mg by mouth daily. Take with 1 mg tablets  . pyridOXINE (VITAMIN B-6) 100 MG tablet Take 100 mg by mouth once a week. On Thursday  . traMADol (ULTRAM) 50 MG tablet Take 50 mg by mouth every 4 (four) hours as needed for moderate pain.   . vitamin B-12 (CYANOCOBALAMIN) 500 MCG tablet Take 500 mcg by mouth once a  week. On Thursdays   FAMILY HISTORY:  Her family history includes Diabetes in her mother, sister, and sister; Heart disease in her brother.  SOCIAL HISTORY: She  reports that she has never smoked. She has never used smokeless tobacco. She reports that she does not drink alcohol or use drugs.  REVIEW OF SYSTEMS:   Review of Systems  Unable to perform ROS: Critical illness   SUBJECTIVE:  Lying on ICU bed, lethargic, on BIPAP  VITAL SIGNS: BP (!) 68/37 (BP Location: Right Arm)   Pulse (!) 108   Temp (!) 101.4 F (38.6 C)   Resp (!) 28   Ht 5\' 3"  (1.6 m)   Wt 79.9 kg   SpO2 93%   BMI 31.20 kg/m   HEMODYNAMICS:  Levophed @ 23mcg (via PIV)  VENTILATOR SETTINGS: FiO2 (%):  [60 %] 60 %  INTAKE / OUTPUT: I/O last 3 completed shifts: In: 732.8 [P.O.:600; I.V.:132.8] Out: 2450 [Urine:2450]  PHYSICAL EXAMINATION: General: Obese elderly  female, lethargic, critically ill, on BIPAP Neuro: lethargic, arouses briefly to loud voice or physical stimuli and is able to briefly answer yes/no question then falls back asleep; is obeying commands. Moving all extremities.  HEENT: OP clear, MM moist  Cardiovascular: Tachycardic with a regular rhythm, no m/r/g Lungs: CTA b/l, no respiratory distress or accessory muscle use Abdomen: Obese, soft, normoactive bowel sounds (perhaps slightly sluggish but still present), diffuse abdominal pain on palpation with guarding (abdominal pain seems worse in RUQ and RLQ with rebound present in RLQ and Right flank. Pain out of proportion to exam.  Musculoskeletal: no LE edema  Skin: no rashes   LABS:  BMET Recent Labs  Lab 02/04/2018 0422 02/08/18 0417 03/06/2018 0342  NA 139 137 138  K 4.4 4.4 4.8  CL 102 104 105  CO2 25 26 16*  BUN 15 17 23   CREATININE 1.42* 1.34* 2.39*  GLUCOSE 94 113* 143*   Electrolytes Recent Labs  Lab 01/30/2018 0422 02/08/18 0417 03/06/18 0342  CALCIUM 8.9 8.5* 7.9*   CBC Recent Labs  Lab 02/08/18 0417 02/08/18 1642 March 06, 2018 0340  WBC 14.9* 18.1* 11.2*  HGB 9.5* 11.2* 11.8*  HCT 31.3* 37.2 40.9  PLT 341 462* 366   Coag's No results for input(s): APTT, INR in the last 168 hours.  Sepsis Markers Recent Labs  Lab 06-Mar-2018 0342  LATICACIDVEN 10.2*   ABG Recent Labs  Lab Mar 06, 2018 0443  PHART 7.275*  PCO2ART 32.9  PO2ART 101   Liver Enzymes Recent Labs  Lab 02/05/18 0423 02/15/2018 0422 2018-03-06 0342  AST 16 19 58*  ALT 14 14 24   ALKPHOS 67 86 76  BILITOT 0.8 1.0 0.6  ALBUMIN 2.4* 3.1* 2.1*   Cardiac Enzymes No results for input(s): TROPONINI, PROBNP in the last 168 hours.  Glucose Recent Labs  Lab 02/02/2018 0750 03-06-2018 0413 03-06-2018 0527  GLUCAP 80 124* 90   Imaging Dg Abd 1 View  Result Date: 03-06-2018 CLINICAL DATA:  Abdominal pain EXAM: ABDOMEN - 1 VIEW COMPARISON:  02/08/2017 FINDINGS: Nonobstructed bowel-gas pattern. Right  ureteral stent in place. Dilated right renal pelvis. Delayed nephrogram on the right. IMPRESSION: 1. Nonobstructed gas pattern 2. Right ureteral stent with moderate renal pelvis enlargement. Delayed nephrogram on the right consistent with decreased renal function. Electronically Signed   By: Donavan Foil M.D.   On: 2018-03-06 03:39   Ct Abdomen Pelvis W Contrast  Result Date: 02/08/2018 CLINICAL DATA:  Right lower quadrant abdominal pain EXAM: CT ABDOMEN AND PELVIS  WITH CONTRAST TECHNIQUE: Multidetector CT imaging of the abdomen and pelvis was performed using the standard protocol following bolus administration of intravenous contrast. CONTRAST:  32mL ISOVUE-300 IOPAMIDOL (ISOVUE-300) INJECTION 61% COMPARISON:  01/21/2018 FINDINGS: Lower chest: Trace bilateral pleural effusions. Associated lower lobe atelectasis. Hepatobiliary: Mildly nodular hepatic contour. Status post cholecystectomy. No intrahepatic or extrahepatic ductal dilatation. Pancreas: Within normal limits. Spleen: Within normal limits. Adrenals/Urinary Tract: Adrenal glands are within normal limits. Left renal cortical scarring/atrophy. Left renal cysts, measuring up to 12 mm. Right renal cysts, measuring up to 16 mm in the interpolar region. Prominent right renal collecting system with indwelling double pigtail ureteral stent. Bladder is within normal limits. Stomach/Bowel: Stomach is within normal limits. No evidence of bowel obstruction. Normal appendix (series 2/image 51). Sigmoid diverticulosis, without evidence of diverticulitis. Vascular/Lymphatic: No evidence of abdominal aortic aneurysm. Atherosclerotic calcifications of the abdominal aorta and branch vessels. Small retroperitoneal lymph nodes, including 11 mm short axis aortocaval node (series 2/image 32), unchanged. No suspicious abdominopelvic lymphadenopathy. Reproductive: Status post hysterectomy. No adnexal masses. Other: Trace pelvic fluid. Musculoskeletal: Degenerative changes of  the visualized thoracolumbar spine. Mild lumbar levoscoliosis. Stable mild superior endplate changes at L1. IMPRESSION: Right double-pigtail ureteral stent. Stable mild prominence of the right renal collecting system. Left renal scarring/atrophy. Bilateral renal cysts, measuring up to 16 mm on the right. No evidence of bowel obstruction. Normal appendix. Sigmoid diverticulosis, without evidence of diverticulitis. Additional stable ancillary findings as above. Electronically Signed   By: Julian Hy M.D.   On: 02/08/2018 15:52   Dg Chest Port 1 View  Result Date: 25-Feb-2018 CLINICAL DATA:  Acute respiratory failure with hypoxia. EXAM: PORTABLE CHEST 1 VIEW COMPARISON:  Radiographs 01/21/2018 lung bases from abdominal CT yesterday. FINDINGS: Very low lung volumes limit assessment. Post median sternotomy. Heart size accentuated by portable technique and low lung volumes. Left basilar opacity and pleural effusion, likely increased. Mild right basilar atelectasis. Vascular congestion versus bronchovascular crowding. No pneumothorax. IMPRESSION: Low lung volumes. Left basilar opacity and pleural effusion, increased from abdominal CT yesterday. Right basilar atelectasis. Bronchovascular crowding versus vascular congestion. Electronically Signed   By: Jeb Levering M.D.   On: February 25, 2018 05:40   CULTURES: Urine culture (8/20): group B strep Blood culture (8/24): pending Urine culture (8/24): pending Sputum culture (8/25): ordered  ANTIBIOTICS: Keflex 8/21>>8/24 Ceftriaxone 8/24>> Vancomycin 8/25>> Meropenem 8/25>>  SIGNIFICANT EVENTS: 8/20: presented to [facility] with N/V/D and Abd pain x 2 months, diagnosed with biliary dyskinesia 8/22: lap chole performed 8/24: abdominal pain worsened 8/25: patient lethargic, hypotensive, and hypoxic after receiving many doses of opiate pain meds >> moved to ICU placed on BIPAP. Found to have septic shock due to LLL pneumonia (presumably aspiration), but with  diffuse abdominal pain and lactic acidosis concerning for ischemic colitis. Briefly on levophed 49mcg but weaned off after given Narcan 2mg  IV x 1. Received total 2L IVF bolus.   LINES/TUBES: 1-20G, 2-22G PIV's Foley catheter 8/20>> Ureteral stent 6/28>>  DISCUSSION: 89yoF with hx HTN, Obesity, GERD, DM, CAD s/p CABG, Diverticulitis, Polymyalgia rheumatica, Celiac disease, and Grade 2 diastolic dysfunction, who is s/p Right ureteral stent placement 11/2017 for treatment of obstructing ureteral tumor, now readmitted 02/05/08 with c/o N/V/D and RUQ abd pain ever since June. In the interim prior to this admission, she had an EGD on 8/9 that showed acute gastritis, small hiatal hernia, and nonobstructing schatzki ring. Colonoscopy was also performed and showed diverticulosis and internal hemorrhoids but no other abnormalities. On admission, patient was found to  have a UTI and AKI-on-CKD. HIDA scan was performed showing biliary dyskinesia. Decision was made to proceed with elective laparoscopic cholecystectomy which was performed on 8/22. Since then patient has had continued abdominal pain which per records was noted to have greatly worsened on 8/24. CT Abdomen with contrast on 8/24 showed no abnormalities. Patient was treated with multiple doses of dilaudid (6.5mg  IV within past 24hrs, of which 3.5mg  IV given this evening), as well as Oxycodone (25mg  IV given within past 24hrs, of which 10mg  received this evening), as well as Toradol. Patient became more lethargic, hypotensive, and had worsening hypoxia. She was placed on BIPAP and moved to the ICU. PCCM consulted.   ASSESSMENT / PLAN:  PULMONARY 1. Acute hypoxic respiratory failure; LLL Aspiration Pneumonia - ABG on my review showed metabolic acidosis but no respiratory acidosis. Removed from BIPAP and placed patient on Ventimask - CXR on my review shows LLL infiltrate that is new compared to prior CXR, likely consistent with an aspiration pneumonia - make  patient NPO; treatment for pneumonia discussed below.   CARDIOVASCULAR 1. Shock; hx CAD s/p CABG, HTN, and Grade 2 diastolic dysfunction - d/c scheduled antihypertensives given the soft BP's. Monitor for signs of fluid overload given her history of diastolic CHF.   RENAL 1. AKI-on-CKD; Metabolic acidosis due to Lactic acidosis: - creatinine 1.86 on admission, up from baseline of 1.16-1.2. Since then her creatinine improved to 1.34 but now worsened again to 2.39. Low UOP per RN. Bladder scan showed no residual urine in bladder. New urine culture sent.  - d/c Toradol given the AKI; avoid all NSAIDS - gave 2L IVF bolus and 2 amps bicarb. Start Bicarb gtt.  - monitor UOP closely; avoid nephrotoxic agents.   GASTROINTESTINAL 1. Biliary dyskinesia; Abdominal pain; Possible ischemic colitis: - s/p lab chole, now with worsening abdominal pain. CT Abdomen with contrast shows no acute abnormalities. Pain is FAR out of proportion to exam and she has a very high lactic acidosis, raising suspicion of ischemic colitis. Other possibility is that her ureteral stent is clotted, although no signs of hydronephrosis on CT Abdomen.  - d/c narcotics given the oversedation and ineffectiveness of them despite high doses. Start IV Acetaminophen PRN and Lidocaine patches.  - Consider Surgery consult in AM  2. GERD; Gastritis: - change PO PPI to IV Famotidine; start strict NPO  HEMATOLOGIC No acute abnormalities; continue DVT prophylaxis.   INFECTIOUS 1. Septic Shock due to LLL Aspiration Pneumonia:  - s/p 2L IVF bolus and 2 amps bicarb. Start bicarb gtt - panculture; check cortisol and procalcitonin - lactate 10.2; continue to trend - broaden antibiotics to Vancomycin and Meropenem - briefly on levophed 33mcg but weaned off after narcan given  ENDOCRINE 1. DM:  - NPO; SSI  NEUROLOGIC 1. Acute Encephalopathy: - iatrogenic due to narcotic pain meds, resolved with narcan. ABG with metabolic acidosis but no  respiratory acidosis.    FAMILY  - Updated patient's daughter at the bedside.  - Inter-disciplinary family meet or Palliative Care meeting due by: 02/15/18  60 minutes critical care time  Vernie Murders, MD Pulmonary and [place] Pager: [phone]  2018/03/02, 6:04 AM

## 2018-02-16 NOTE — Progress Notes (Signed)
Pharmacy Antibiotic Note  [name] is a 84 y.o. female admitted on 02/05/2018 with sepsis.  Pharmacy has been consulted for vancomycin/meropenem dosing.  Plan: Vancomycin 1750 mg x1 and f/u scr/levels for additional doses Goal 400-500 Meropenem 1 Gm IV q12h F/u scr/cultures  Height: 5\' 3"  (160 cm) Weight: 176 lb 2.4 oz (79.9 kg) IBW/kg (Calculated) : 52.4  Temp (24hrs), Avg:100.3 F (37.9 C), Min:98.3 F (36.8 C), Max:102.8 F (39.3 C)  Recent Labs  Lab 02/03/18 2139 02/05/18 0423 02/15/2018 0422 02/08/18 0417 02/08/18 1642 2018-03-02 0340 02-Mar-2018 0342  WBC 16.9* 13.0* 17.5* 14.9* 18.1* 11.2*  --   CREATININE 1.86* 1.45* 1.42* 1.34*  --   --  2.39*  LATICACIDVEN  --   --   --   --   --   --  10.2*    Estimated Creatinine Clearance: 16.6 mL/min (A) (by C-G formula based on SCr of 2.39 mg/dL (H)).    Allergies  Allergen Reactions  . Accutane [Isotretinoin] Other (See Comments)    Affected liver  . Ciprofloxacin Rash and Other (See Comments)    Kidney failure  . Erythromycin Diarrhea and Rash  . Gluten Diarrhea and Other (See Comments)    Celiac sprue  . Gluten Meal Diarrhea and Rash    Celiac sprue intolerable  . Isotretinoin Other (See Comments)    Affects liver  . Lipitor [Atorvastatin] Other (See Comments)    Affects legs  . Lisinopril Other (See Comments)    Pt doesn't remember reaction   . Lovastatin Other (See Comments)    Affects legs  . Penicillins Other (See Comments)    From childhood Has patient had a PCN reaction causing immediate rash, facial/tongue/throat swelling, SOB or lightheadedness with hypotension: Unknown Has patient had a PCN reaction causing severe rash involving mucus membranes or skin necrosis: Unknown Has patient had a PCN reaction that required hospitalization: Unknown Has patient had a PCN reaction occurring within the last 10 years: No If all of the above answers are "NO", then may proceed with Cephalosporin use.  Marland Kitchen Phenytoin  Sodium Extended Other (See Comments)    Affects liver  . Pravastatin Other (See Comments)    Affected legs  . Crestor [Rosuvastatin]     Muscle aches even on 5mg  once weekly  . Gabapentin Other (See Comments)    Shaky, swollen legs  . Hydrocodone Other (See Comments)    Restless leg  . Metoprolol Itching and Swelling  . Prolia [Denosumab] Other (See Comments)    Joints and muscle aches, loss of hair  . Boniva [Ibandronic Acid] Other (See Comments)    headaches  . Clarithromycin Rash  . Hydralazine Rash    Pt does not know what the reactions were.  Marland Kitchen Lescol [Fluvastatin Sodium] Other (See Comments)    Affects legs  . Sulfa Antibiotics Rash  . Sulfasalazine Rash  . Welchol [Colesevelam Hcl] Other (See Comments)    No energy  . Zetia [Ezetimibe] Other (See Comments)    myalgias  . Zithromax [Azithromycin] Rash    Antimicrobials this admission: 8/25 vancomycin >>  8/21 keflex  >> 8/25 8/25 meropenem >>  Dose adjustments this admission:   Microbiology results:  BCx:   UCx:    Sputum:    MRSA PCR:   Thank you for allowing pharmacy to be a part of this patient's care.  Dorrene German 03/02/18 6:03 AM

## 2018-02-16 NOTE — Progress Notes (Signed)
Pt in respiratory distress with gurgling. BP steadily decreasing despite increase in levophed. MD called to bedside. Pt placed on BiPAP. Family made pt comfort care. BiPAP removed and pt placed on venti mask. 1mg  IV morphine given. All family to bedside. 1040 no activity on tele monitor. Two RNs auscultated for 1 minute each (Johnwilliam Shepperson Mateo Flow RN, Austin Miles RN). No heart or lung sounds present. Time of death called at 8. MD notified. Family at bedside.

## 2018-02-16 NOTE — Progress Notes (Signed)
Called to bedside by RN. Pt tachypneic, hypotensive, tachycardiac and presenting with a fever of 102.8 rectally. Pt has been in severe pain all day according to daughter. She states that she has become increasingly concerned because pt is less responsive and appears to be getting worse. On assessment, abdomen distended, tenderness noted on palpation. Pt is moaning, tachypneic and diaphoretic. I will transfer the pt to SDU for further evaluation and closer monitoring.     Urinary retention -Pt has not urinated since 1400 according to RN. Increased MIVF abd IVF bolus given. Foley placed. 500cc urine output noted.  - Levsin IV given for bladder spasms.   Abdominal pain - Continue with scheduled IV dilaudid - A CT scan was ordered earlier this afternoon and no acute disease processes noted. Given pt increasing abdominal pain, fever, and rapid decline will order a stat KUB now.  - BMP ordered. - Transferred to SDU for closer monitoring. Abdominal pain may be related to urinary retention, recent surgery and constipation. Pt given lactulose, enema, and dulcolax on dayshift and still no BM.   Hematuria -Consult previously placed to urology for further management.  -CBC   Fever - Lactic Acid, CBC - MIVF at 75  0400-Called again by RR RN. Pt desaturated and became hypotensive (68/37) during transfer to ICU. Started IVF bolus. Consulted CCM for further evaluation. Spoke with daughter regarding care.Placed pt on bipap. Due to pt recent decline in mental status and BP will stop IV dilaudid for now.   Lovey Newcomer, NP [facility] hospitalist 7p-7a [phone]

## 2018-02-16 NOTE — Progress Notes (Addendum)
PROGRESS NOTE    HANALEI GLACE  DVV:[id number]  DOB: 04/06/1929  DOA: 01/22/2018 PCP: Hulan Fess, MD Outpatient Specialists:   Hospital course (brief): 84 y.o. woman with CAD s/p CABG, diverticulitis, HFpEF, celiac, recent lap cholecystectomy on 02/05/2018 who presents with abdominal pain and nausea likely related to complications/obstruction of previously placed R ureteral stent (11/2692) complicated by UTI and gastritis. Family Dorian Pod, daughter, is POA) are discussing palliative care as of this morning. Hypoxic and hypotensive overnight on 03/03/18. Remains critically ill.   Assessment & Plan:  # Urinary retention suspected R ureteral stent obstruction, complicated by UTI and sepsis - pain control with trial of IV fentanyl and toradol one time doses - one time IV ativan - when family decides for palliative care ie inpatient comfort care, will plan for fentanyl drip or scheduled dilaudid + ativan - continuing antibiotics for now with vanc and meropenem  - on levophed for BP support MAP goal > 65 - prn IV zofran - precedex titration as per critical care team - urology following: decision to open ureteral stent depends on goals of care  # Goals of care - partial DNR/DNI (okay for pressors and BiPAP for now) - plan for goals of care discussion this afternoon and plan to transition to comfort care pending discussion while inpatient - palliative care consulted  # Hypothyroid - continue synthroid   DVT prophylaxis: SCD/TEDs Code Status: partial DNRI/DNI Family Communication: with daughter Disposition Plan: possible hospice vs comfort care while inpatient   Consultants:  Urology  Surgery   Subjective: Patient ill, reporting severe diffuse abdominal pain and nausea. Tachypneic, resp distress. On non rebreather this AM. Mentation waxing and waning but responsive to questions.   Objective: Vitals:   Mar 03, 2018 0830 03/03/18 0845 03-03-2018 0900 March 03, 2018 0915  BP: (!) 110/46  (!) 101/43 (!) 90/35 (!) 112/50  Pulse: 94 94 94 94  Resp: (!) 34 (!) 43 (!) 33 (!) 36  Temp:      TempSrc:      SpO2: 91% 91% 91% 90%  Weight:      Height:        Intake/Output Summary (Last 24 hours) at 03-Mar-2018 1009 Last data filed at March 03, 2018 0450 Gross per 24 hour  Intake 382.81 ml  Output 600 ml  Net -217.19 ml   Filed Weights   01/18/2018 0425 02/08/18 0434 2018/03/03 0400  Weight: 77 kg 79.2 kg 79.9 kg    Exam:  PHYSICAL EXAM  BP (!) 112/50   Pulse 94   Temp 97.6 F (36.4 C) (Axillary)   Resp (!) 36   Ht 5\' 3"  (1.6 m)   Wt 79.9 kg   SpO2 90%   BMI 31.20 kg/m   GEN obese elderly caucasian female; in distress  HEENT NCAT EOM PERRL; clear oropharynx, no cervical LAD; moist mucus membranes  JVP difficult to appreciate estimated 4-5 cm H2O above RA; no HJR ; no carotid bruits b/l ;  CV regular tachycardic; normal S1 and S2; no m/r/g or S3/S4; PMI non displaced;  RESP  On non rebreather. diminished at bases, breathing labored and tachypneic; symmetric  ABD soft, distended, hypoactive BS, tender, non rigid and no guarding; well healed surgical wounds clean/dry/intact  EXT warm throughout b/l; no significant lower ext edema b/l  PULSES  DP and radials intact b/l  SKIN/MSK no rashes or lesions  NEURO/PSYCH AAOx1 (name); mentation waxing and waning; focal deficits  Data Reviewed: Basic Metabolic Panel: Recent Labs  Lab 02/03/18  2139 02/05/18 0423 02/05/2018 0422 02/08/18 0417 02-11-2018 0342  NA 139 140 139 137 138  K 3.8 4.9 4.4 4.4 4.8  CL 98 105 102 104 105  CO2 26 27 25 26  16*  GLUCOSE 155* 125* 94 113* 143*  BUN 25* 18 15 17 23   CREATININE 1.86* 1.45* 1.42* 1.34* 2.39*  CALCIUM 8.9 8.3* 8.9 8.5* 7.9*   Liver Function Tests: Recent Labs  Lab 02/03/18 2139 02/05/18 0423 01/20/2018 0422 2018-02-11 0342  AST 22 16 19  58*  ALT 17 14 14 24   ALKPHOS 89 67 86 76  BILITOT 0.8 0.8 1.0 0.6  PROT 7.2 5.5* 6.8 5.1*  ALBUMIN 3.3* 2.4* 3.1* 2.1*   Recent Labs    Lab 02/03/18 2139 11-Feb-2018 0623  LIPASE 35 311*   No results for input(s): AMMONIA in the last 168 hours. CBC: Recent Labs  Lab 02/05/18 0423 02/08/2018 0422 02/08/18 0417 02/08/18 1642 02-11-18 0340  WBC 13.0* 17.5* 14.9* 18.1* 11.2*  NEUTROABS  --   --   --  9.5*  --   HGB 9.5* 11.1* 9.5* 11.2* 11.8*  HCT 31.1* 36.5 31.3* 37.2 40.9  MCV 78.9 79.7 80.7 79.8 82.8  PLT 309 409* 341 462* 366   Cardiac Enzymes: Recent Labs  Lab February 11, 2018 0623  TROPONINI 0.05*   BNP (last 3 results) No results for input(s): PROBNP in the last 8760 hours. CBG: Recent Labs  Lab 02/15/2018 0750 02/11/2018 0413 02/11/2018 0527 2018/02/11 0741 02/11/18 0843  GLUCAP 80 124* 90 67* 105*    Recent Results (from the past 240 hour(s))  Urine culture     Status: Abnormal   Collection Time: 02/06/2018  1:20 AM  Result Value Ref Range Status   Specimen Description   Final    URINE, RANDOM Performed at [facility] [address], [place], [place] [address]    Special Requests   Final    NONE Performed at [facility], [place] [address], [place], [place] [address]    Culture (A)  Final    >=100,000 COLONIES/mL GROUP B STREP(S.AGALACTIAE)ISOLATED TESTING AGAINST S. AGALACTIAE NOT ROUTINELY PERFORMED DUE TO PREDICTABILITY OF AMP/PEN/VAN SUSCEPTIBILITY. Performed at [facility], [place] [address], [place], [place] [address]    Report Status 02/05/2018 FINAL  Final  Gastrointestinal Panel by PCR , Stool     Status: None   Collection Time: 02/08/2018  8:00 PM  Result Value Ref Range Status   Campylobacter species NOT DETECTED NOT DETECTED Final   Plesimonas shigelloides NOT DETECTED NOT DETECTED Final   Salmonella species NOT DETECTED NOT DETECTED Final   Yersinia enterocolitica NOT DETECTED NOT DETECTED Final   Vibrio species NOT DETECTED NOT DETECTED Final   Vibrio cholerae NOT DETECTED NOT DETECTED Final   Enteroaggregative E coli (EAEC) NOT DETECTED NOT  DETECTED Final   Enteropathogenic E coli (EPEC) NOT DETECTED NOT DETECTED Final   Enterotoxigenic E coli (ETEC) NOT DETECTED NOT DETECTED Final   Shiga like toxin producing E coli (STEC) NOT DETECTED NOT DETECTED Final   Shigella/Enteroinvasive E coli (EIEC) NOT DETECTED NOT DETECTED Final   Cryptosporidium NOT DETECTED NOT DETECTED Final   Cyclospora cayetanensis NOT DETECTED NOT DETECTED Final   Entamoeba histolytica NOT DETECTED NOT DETECTED Final   Giardia lamblia NOT DETECTED NOT DETECTED Final   Adenovirus F40/41 NOT DETECTED NOT DETECTED Final   Astrovirus NOT DETECTED NOT DETECTED Final   Norovirus GI/GII NOT DETECTED NOT DETECTED Final   Rotavirus A  NOT DETECTED NOT DETECTED Final   Sapovirus (I, II, IV, and V) NOT DETECTED NOT DETECTED Final    Comment: Performed at [facility], [address] [place], [place] [address]  MRSA PCR Screening     Status: None   Collection Time: Mar 08, 2018  4:26 AM  Result Value Ref Range Status   MRSA by PCR NEGATIVE NEGATIVE Final    Comment:        The GeneXpert MRSA Assay (FDA approved for NASAL specimens only), is one component of a comprehensive MRSA colonization surveillance program. It is not intended to diagnose MRSA infection nor to guide or monitor treatment for MRSA infections. Performed at [facility], [place] [address], [place], [place] [address]       Studies: Dg Abd 1 View  Result Date: 03-08-2018 CLINICAL DATA:  Hydronephrosis. EXAM: ABDOMEN - 1 VIEW COMPARISON:  Radiograph of same day.  CT scan of February 08, 2018. FINDINGS: The bowel gas pattern is normal. Status post cholecystectomy. Stable position of right ureteral stent is noted. Persistent right nephrogram is noted with stable amount of contrast present within the dilated right renal pelvis. IMPRESSION: Stable right nephrogram is noted with stable amount of contrast seen in right renal pelvis. Right ureteral stent is unchanged in position.  This is consistent with reduced renal function. Clinical correlation is recommended. Electronically Signed   By: Marijo Conception, M.D.   On: 2018/03/08 10:03   Dg Abd 1 View  Result Date: 2018-03-08 CLINICAL DATA:  Abdominal pain EXAM: ABDOMEN - 1 VIEW COMPARISON:  02/08/2017 FINDINGS: Nonobstructed bowel-gas pattern. Right ureteral stent in place. Dilated right renal pelvis. Delayed nephrogram on the right. IMPRESSION: 1. Nonobstructed gas pattern 2. Right ureteral stent with moderate renal pelvis enlargement. Delayed nephrogram on the right consistent with decreased renal function. Electronically Signed   By: Donavan Foil M.D.   On: 03/08/18 03:39   Ct Abdomen Pelvis W Contrast  Result Date: 02/08/2018 CLINICAL DATA:  Right lower quadrant abdominal pain EXAM: CT ABDOMEN AND PELVIS WITH CONTRAST TECHNIQUE: Multidetector CT imaging of the abdomen and pelvis was performed using the standard protocol following bolus administration of intravenous contrast. CONTRAST:  53mL ISOVUE-300 IOPAMIDOL (ISOVUE-300) INJECTION 61% COMPARISON:  01/21/2018 FINDINGS: Lower chest: Trace bilateral pleural effusions. Associated lower lobe atelectasis. Hepatobiliary: Mildly nodular hepatic contour. Status post cholecystectomy. No intrahepatic or extrahepatic ductal dilatation. Pancreas: Within normal limits. Spleen: Within normal limits. Adrenals/Urinary Tract: Adrenal glands are within normal limits. Left renal cortical scarring/atrophy. Left renal cysts, measuring up to 12 mm. Right renal cysts, measuring up to 16 mm in the interpolar region. Prominent right renal collecting system with indwelling double pigtail ureteral stent. Bladder is within normal limits. Stomach/Bowel: Stomach is within normal limits. No evidence of bowel obstruction. Normal appendix (series 2/image 51). Sigmoid diverticulosis, without evidence of diverticulitis. Vascular/Lymphatic: No evidence of abdominal aortic aneurysm. Atherosclerotic  calcifications of the abdominal aorta and branch vessels. Small retroperitoneal lymph nodes, including 11 mm short axis aortocaval node (series 2/image 32), unchanged. No suspicious abdominopelvic lymphadenopathy. Reproductive: Status post hysterectomy. No adnexal masses. Other: Trace pelvic fluid. Musculoskeletal: Degenerative changes of the visualized thoracolumbar spine. Mild lumbar levoscoliosis. Stable mild superior endplate changes at L1. IMPRESSION: Right double-pigtail ureteral stent. Stable mild prominence of the right renal collecting system. Left renal scarring/atrophy. Bilateral renal cysts, measuring up to 16 mm on the right. No evidence of bowel obstruction. Normal appendix. Sigmoid diverticulosis, without evidence of diverticulitis. Additional stable ancillary findings as  above. Electronically Signed   By: Julian Hy M.D.   On: 02/08/2018 15:52   Dg Chest Port 1 View  Result Date: 02-15-2018 CLINICAL DATA:  Acute respiratory failure with hypoxia. EXAM: PORTABLE CHEST 1 VIEW COMPARISON:  Radiographs 01/21/2018 lung bases from abdominal CT yesterday. FINDINGS: Very low lung volumes limit assessment. Post median sternotomy. Heart size accentuated by portable technique and low lung volumes. Left basilar opacity and pleural effusion, likely increased. Mild right basilar atelectasis. Vascular congestion versus bronchovascular crowding. No pneumothorax. IMPRESSION: Low lung volumes. Left basilar opacity and pleural effusion, increased from abdominal CT yesterday. Right basilar atelectasis. Bronchovascular crowding versus vascular congestion. Electronically Signed   By: Jeb Levering M.D.   On: February 15, 2018 05:40       Medicines (inpatient): Scheduled Meds: . aspirin EC  81 mg Oral Q1500  . dextrose      . insulin aspart  1-3 Units Subcutaneous Q4H  . ketorolac  15 mg Intravenous Once  . levothyroxine  50 mcg Oral QAC breakfast  . lidocaine  2 patch Transdermal Q24H  . LORazepam  0.5  mg Intravenous Once  . multivitamin with minerals  1 tablet Oral Q1500  . naloxone      . naloxone  2 mg Intravenous Once  . pyridOXINE  100 mg Oral Weekly  . sodium bicarbonate      . vancomycin variable dose per unstable renal function (pharmacist dosing)   Does not apply See admin instructions   Continuous Infusions: . sodium chloride 10 mL/hr at 02/08/18 1742  . acetaminophen Stopped (Feb 15, 2018 7564)  . dexmedetomidine (PRECEDEX) IV infusion 0.3 mcg/kg/hr (02-15-2018 0901)  . famotidine (PEPCID) IV    . meropenem (MERREM) IV Stopped (02-15-2018 3329)  . norepinephrine (LEVOPHED) Adult infusion 10 mcg/min (02-15-18 0742)  .  sodium bicarbonate (isotonic) infusion in sterile water 125 mL/hr at 02/15/18 0658  . sodium chloride      Active Problems:   Type 2 diabetes mellitus with complication (HCC)   Abdominal pain   UTI (urinary tract infection)   Nausea & vomiting   Diarrhea   Nausea and vomiting    Time spent: > 55 minutes    Colbert Ewing, MD  [facility] Pager [phone]  If 7PM-7AM, please contact night-coverage [email or web address] Password [email or web address] 15-Feb-2018, 10:09 AM    LOS: 4 days

## 2018-02-16 DEATH — deceased

## 2018-02-20 ENCOUNTER — Ambulatory Visit (HOSPITAL_COMMUNITY): Admission: RE | Admit: 2018-02-20 | Payer: Medicare Other | Source: Ambulatory Visit | Admitting: Urology

## 2018-02-20 SURGERY — CYSTOURETEROSCOPY, WITH RETROGRADE PYELOGRAM AND STENT INSERTION
Anesthesia: General | Laterality: Right

## 2018-02-24 ENCOUNTER — Ambulatory Visit: Payer: Medicare Other | Admitting: Cardiology

## 2018-08-18 IMAGING — CR DG CHEST 2V
2 series · 2 of 2 positions shown · non-contrast
Comparison: January 25, 2015

CLINICAL DATA: Pain and nausea.  Hypertension.

EXAM:
CHEST - 2 VIEW

[w chest lat]
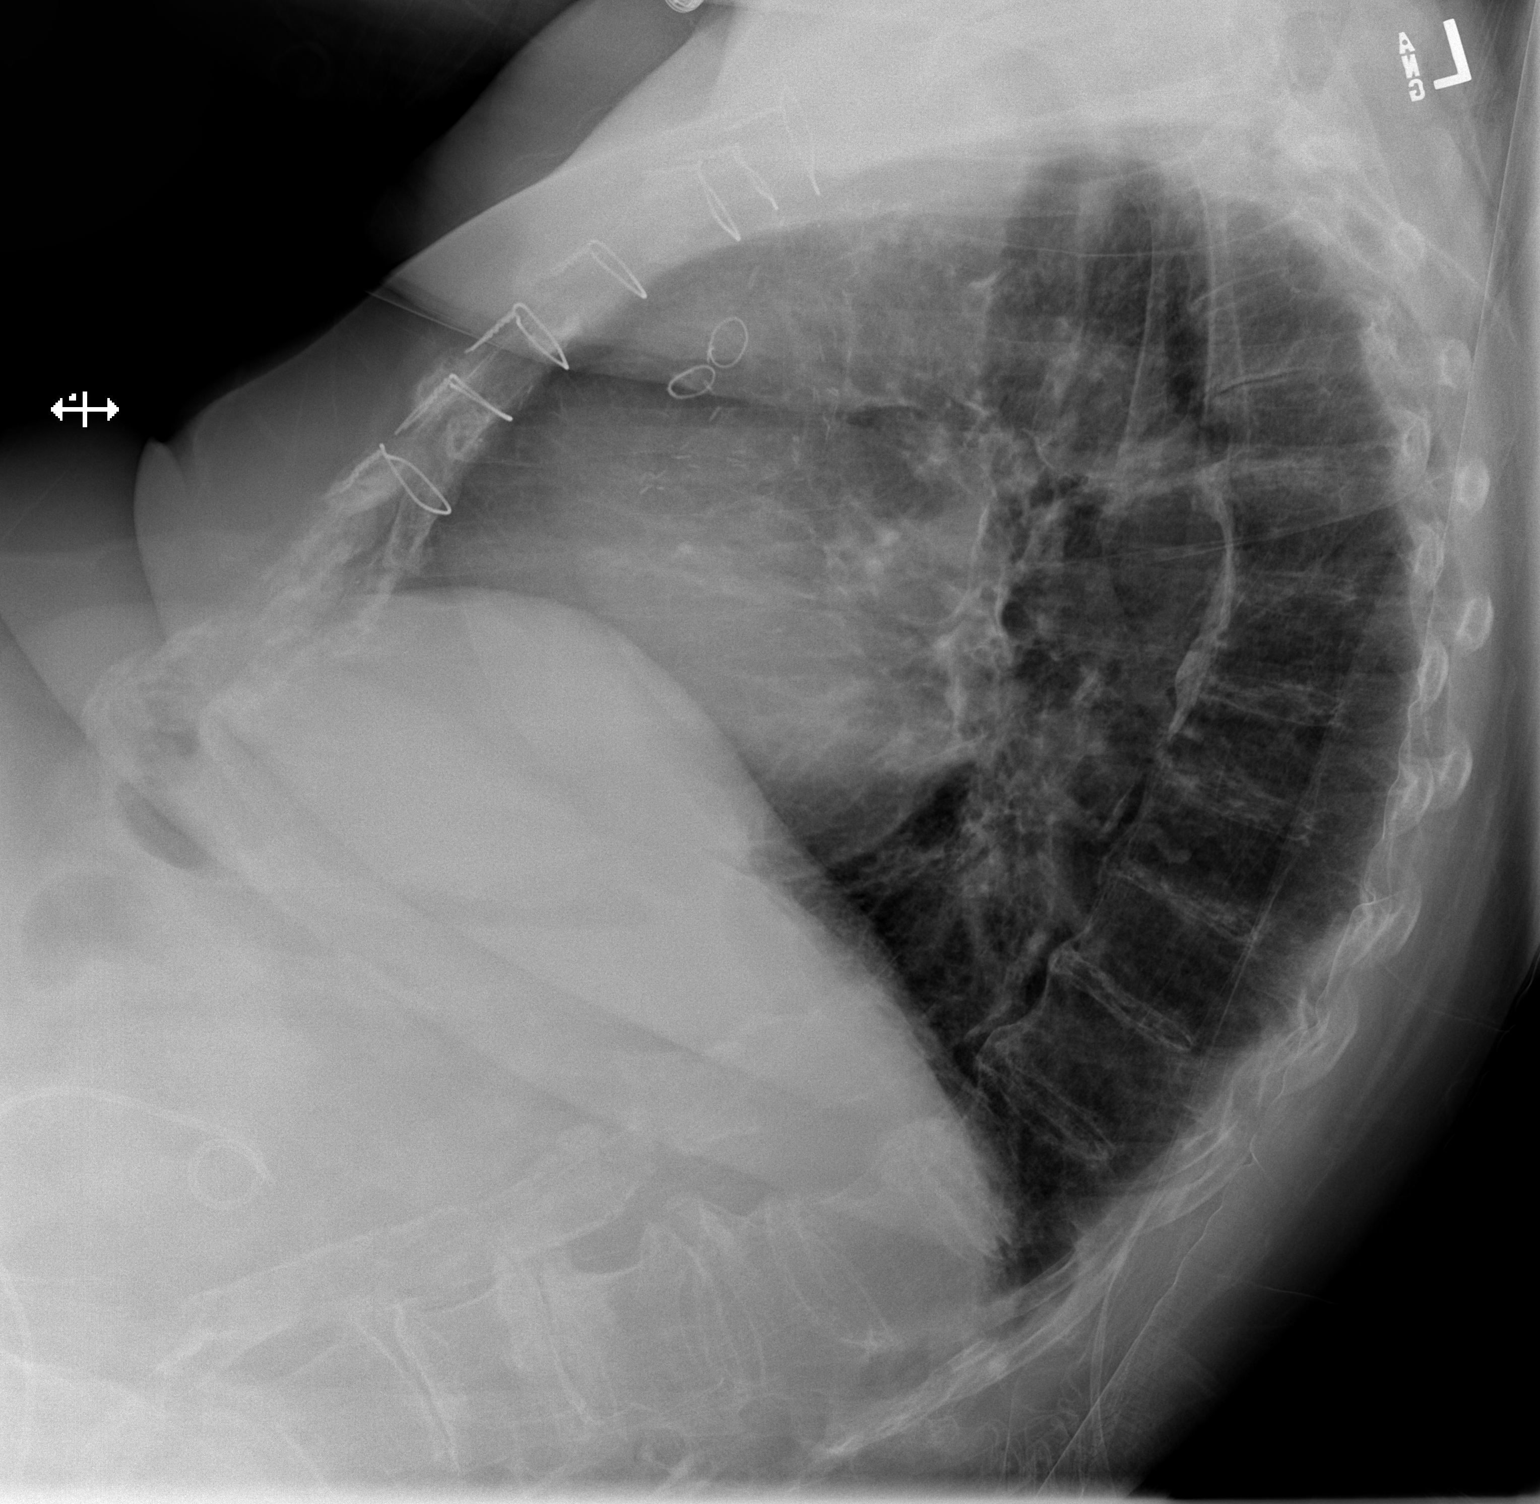

[x chest ap]
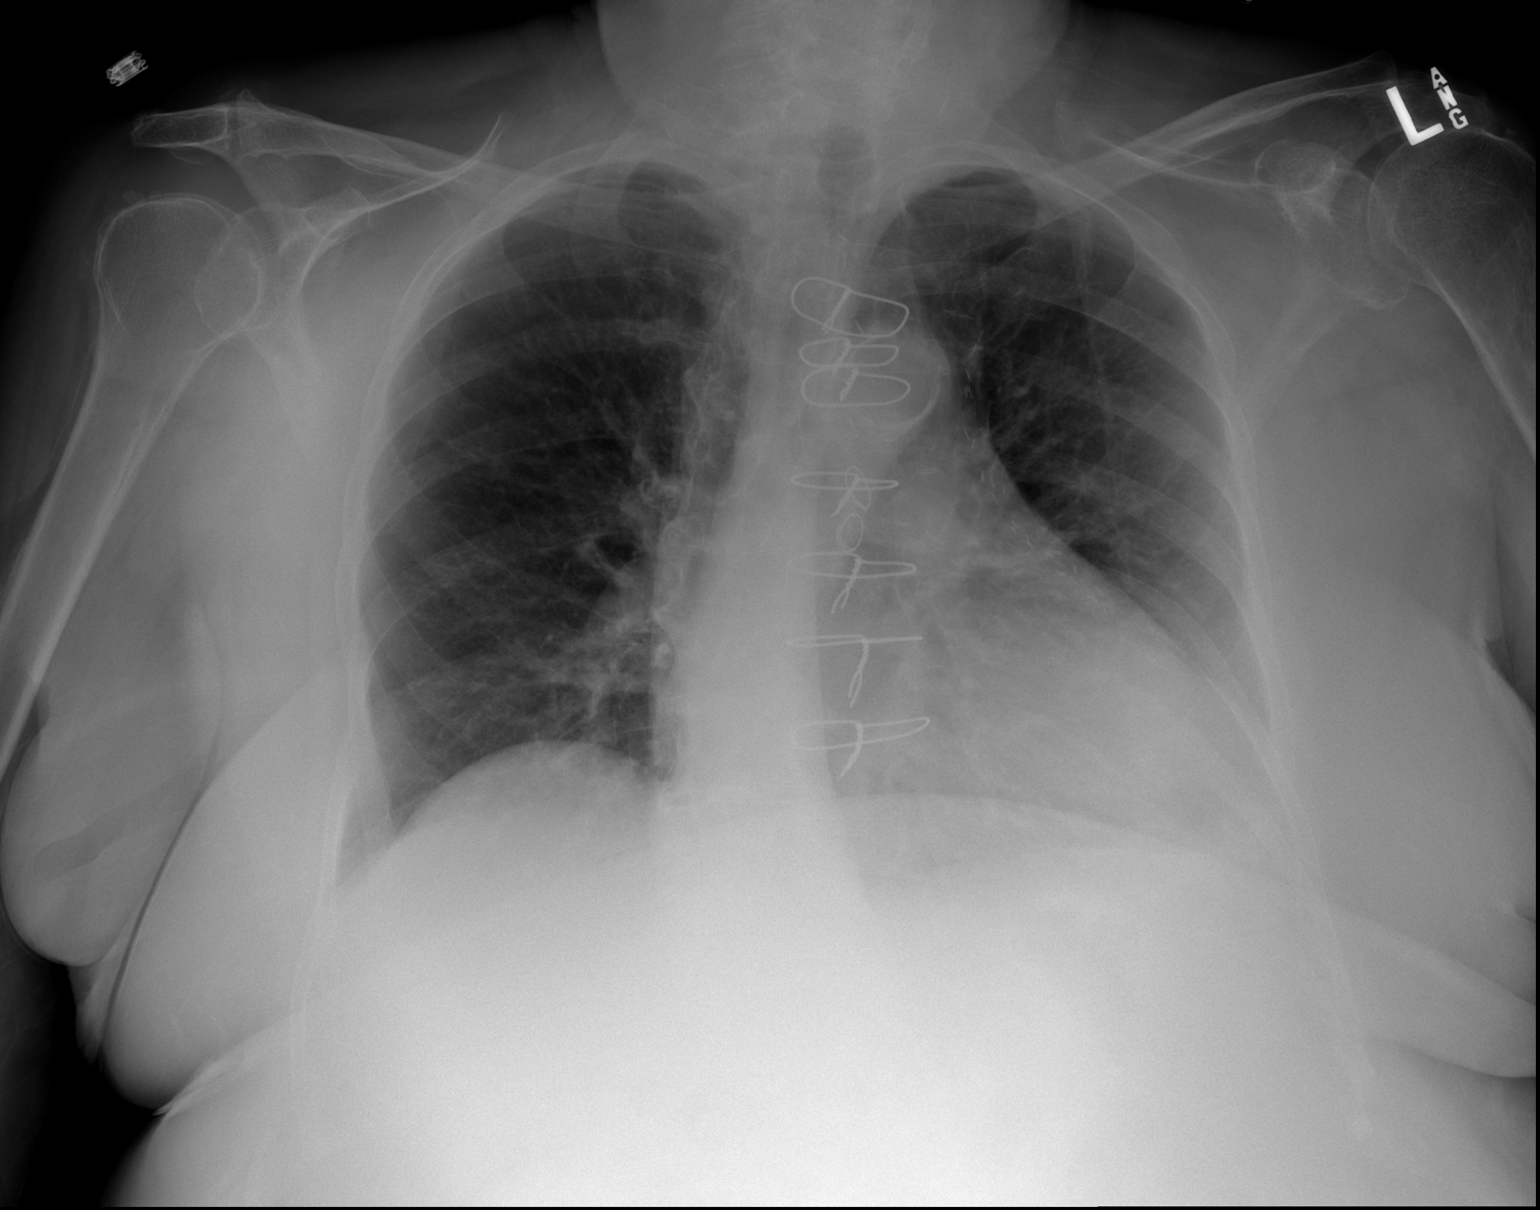

[2 of 2 positions shown; findings below may reference images not displayed]

FINDINGS: There is no edema or consolidation. Heart is upper normal in size
with pulmonary vascularity normal. No adenopathy. Patient is status
post coronary artery bypass grafting. There is aortic
atherosclerosis. There is thoracic dextroscoliosis. There is
degenerative change throughout the thoracic spine.
IMPRESSION: No edema or consolidation. Heart size within normal limits. Status
post coronary artery bypass grafting. There is aortic
atherosclerosis.

Aortic Atherosclerosis ([id number]-786.6).

## 2018-09-06 IMAGING — DX DG ABDOMEN 1V
2 series · 3 of 3 positions shown · non-contrast
Comparison: Radiograph of same day.  CT scan February 08, 2018.

CLINICAL DATA: Hydronephrosis.

EXAM:
ABDOMEN - 1 VIEW

[abdomen kub (1 of 2)]
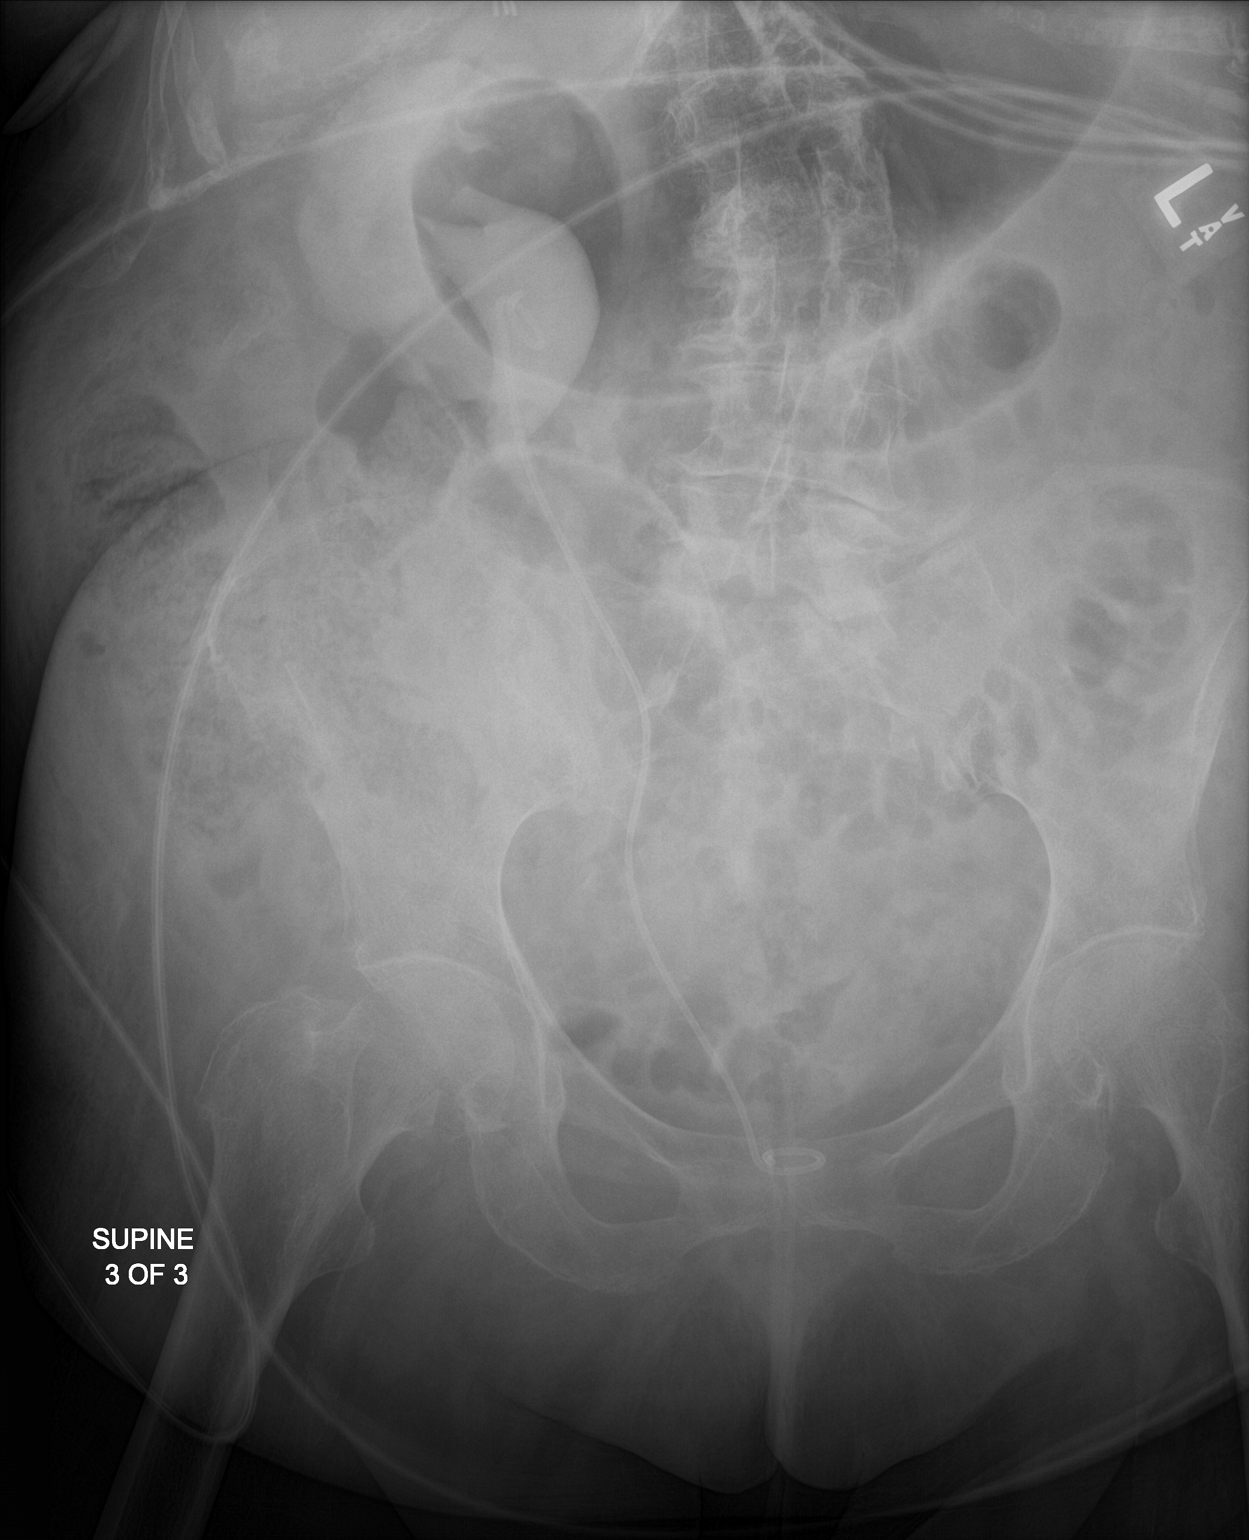

[Series 2: abdomen kub · 0.14mm/px · 2 of 2 slices shown (2 of 2)]
[im 1/2]
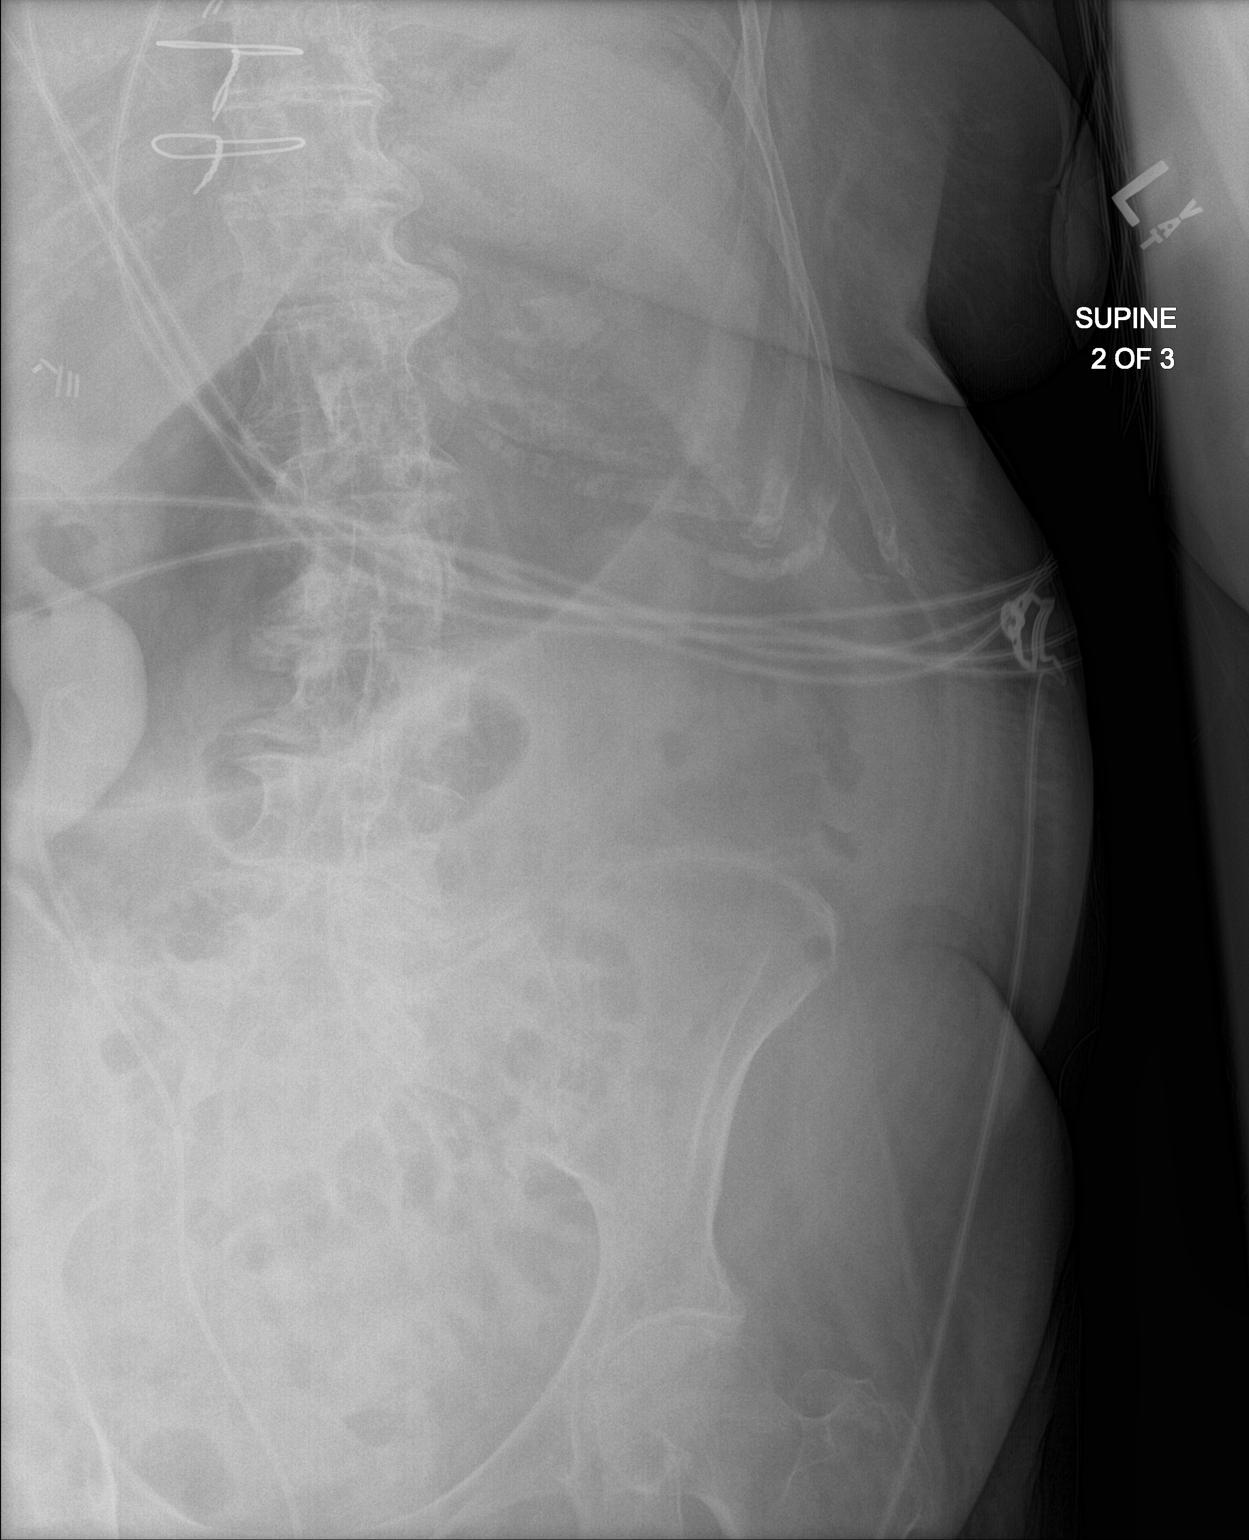
[im 2/2]
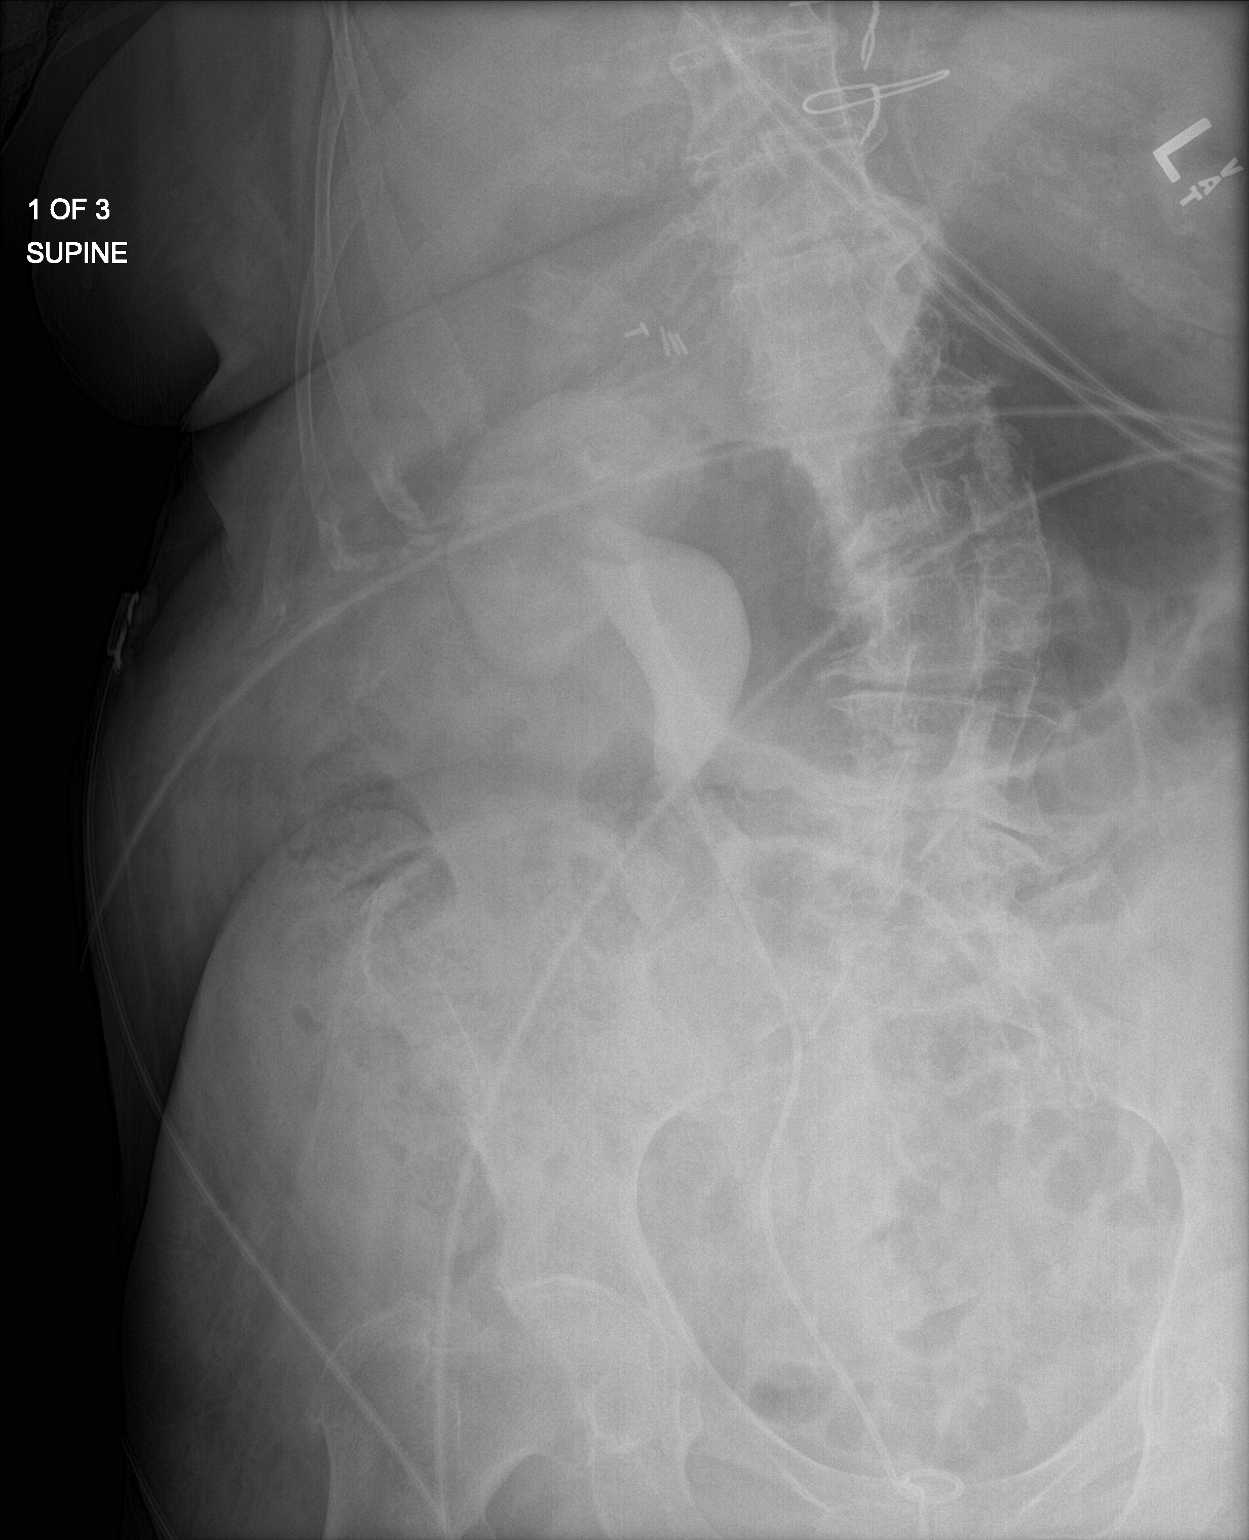

[3 of 3 positions shown; findings below may reference images not displayed]

FINDINGS: The bowel gas pattern is normal. Status post cholecystectomy. Stable
position of right ureteral stent is noted. Persistent right
nephrogram is noted with stable amount of contrast present within
the dilated right renal pelvis.
IMPRESSION: Stable right nephrogram is noted with stable amount of contrast seen
in right renal pelvis. Right ureteral stent is unchanged in
position. This is consistent with reduced renal function. Clinical
correlation is recommended.
